# Patient Record
Sex: Female | Born: 1980
Health system: Southern US, Community
[De-identification: ages and names within clinical notes are randomized; demographics above are authoritative.]

## PROBLEM LIST (undated history)

## (undated) ENCOUNTER — Inpatient Hospital Stay (HOSPITAL_COMMUNITY): Payer: Self-pay

## (undated) DIAGNOSIS — R519 Headache, unspecified: Secondary | ICD-10-CM

## (undated) DIAGNOSIS — J45909 Unspecified asthma, uncomplicated: Secondary | ICD-10-CM

## (undated) DIAGNOSIS — F410 Panic disorder [episodic paroxysmal anxiety] without agoraphobia: Secondary | ICD-10-CM

## (undated) DIAGNOSIS — J189 Pneumonia, unspecified organism: Secondary | ICD-10-CM

## (undated) DIAGNOSIS — R51 Headache: Secondary | ICD-10-CM

## (undated) DIAGNOSIS — F32A Depression, unspecified: Secondary | ICD-10-CM

## (undated) DIAGNOSIS — F439 Reaction to severe stress, unspecified: Secondary | ICD-10-CM

## (undated) DIAGNOSIS — I10 Essential (primary) hypertension: Secondary | ICD-10-CM

## (undated) DIAGNOSIS — F329 Major depressive disorder, single episode, unspecified: Secondary | ICD-10-CM

## (undated) DIAGNOSIS — E119 Type 2 diabetes mellitus without complications: Secondary | ICD-10-CM

## (undated) HISTORY — PX: WISDOM TOOTH EXTRACTION: SHX21

## (undated) HISTORY — PX: INDUCED ABORTION: SHX677

---
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
## 2000-02-07 ENCOUNTER — Inpatient Hospital Stay (HOSPITAL_COMMUNITY): Admission: AD | Admit: 2000-02-07 | Discharge: 2000-02-07 | Payer: Self-pay | Admitting: Obstetrics

## 2000-05-04 ENCOUNTER — Ambulatory Visit (HOSPITAL_COMMUNITY): Admission: RE | Admit: 2000-05-04 | Discharge: 2000-05-04 | Payer: Self-pay | Admitting: *Deleted

## 2000-07-13 ENCOUNTER — Observation Stay (HOSPITAL_COMMUNITY): Admission: AD | Admit: 2000-07-13 | Discharge: 2000-07-14 | Payer: Self-pay | Admitting: Obstetrics

## 2000-07-13 ENCOUNTER — Encounter: Payer: Self-pay | Admitting: Obstetrics & Gynecology

## 2000-07-14 ENCOUNTER — Inpatient Hospital Stay (HOSPITAL_COMMUNITY): Admission: AD | Admit: 2000-07-14 | Discharge: 2000-07-14 | Payer: Self-pay | Admitting: Obstetrics & Gynecology

## 2000-07-15 ENCOUNTER — Inpatient Hospital Stay (HOSPITAL_COMMUNITY): Admission: AD | Admit: 2000-07-15 | Discharge: 2000-07-15 | Payer: Self-pay | Admitting: Obstetrics

## 2000-09-02 ENCOUNTER — Inpatient Hospital Stay (HOSPITAL_COMMUNITY): Admission: AD | Admit: 2000-09-02 | Discharge: 2000-09-02 | Payer: Self-pay | Admitting: *Deleted

## 2000-09-14 ENCOUNTER — Inpatient Hospital Stay (HOSPITAL_COMMUNITY): Admission: AD | Admit: 2000-09-14 | Discharge: 2000-09-14 | Payer: Self-pay | Admitting: Obstetrics & Gynecology

## 2000-09-20 ENCOUNTER — Encounter: Payer: Self-pay | Admitting: Obstetrics

## 2000-09-20 ENCOUNTER — Inpatient Hospital Stay (HOSPITAL_COMMUNITY): Admission: AD | Admit: 2000-09-20 | Discharge: 2000-09-20 | Payer: Self-pay | Admitting: Obstetrics

## 2000-09-24 ENCOUNTER — Inpatient Hospital Stay (HOSPITAL_COMMUNITY): Admission: AD | Admit: 2000-09-24 | Discharge: 2000-09-24 | Payer: Self-pay | Admitting: *Deleted

## 2000-09-25 ENCOUNTER — Inpatient Hospital Stay (HOSPITAL_COMMUNITY): Admission: AD | Admit: 2000-09-25 | Discharge: 2000-09-27 | Payer: Self-pay | Admitting: Obstetrics & Gynecology

## 2001-01-25 ENCOUNTER — Emergency Department (HOSPITAL_COMMUNITY): Admission: EM | Admit: 2001-01-25 | Discharge: 2001-01-25 | Payer: Self-pay | Admitting: Emergency Medicine

## 2001-05-15 ENCOUNTER — Emergency Department (HOSPITAL_COMMUNITY): Admission: EM | Admit: 2001-05-15 | Discharge: 2001-05-15 | Payer: Self-pay | Admitting: Emergency Medicine

## 2001-05-16 ENCOUNTER — Emergency Department (HOSPITAL_COMMUNITY): Admission: EM | Admit: 2001-05-16 | Discharge: 2001-05-16 | Payer: Self-pay | Admitting: Emergency Medicine

## 2001-05-31 ENCOUNTER — Emergency Department (HOSPITAL_COMMUNITY): Admission: EM | Admit: 2001-05-31 | Discharge: 2001-05-31 | Payer: Self-pay | Admitting: Emergency Medicine

## 2001-06-05 ENCOUNTER — Emergency Department (HOSPITAL_COMMUNITY): Admission: EM | Admit: 2001-06-05 | Discharge: 2001-06-05 | Payer: Self-pay | Admitting: Emergency Medicine

## 2001-08-07 ENCOUNTER — Inpatient Hospital Stay (HOSPITAL_COMMUNITY): Admission: AD | Admit: 2001-08-07 | Discharge: 2001-08-07 | Payer: Self-pay | Admitting: *Deleted

## 2002-04-24 ENCOUNTER — Emergency Department (HOSPITAL_COMMUNITY): Admission: EM | Admit: 2002-04-24 | Discharge: 2002-04-24 | Payer: Self-pay | Admitting: Emergency Medicine

## 2002-11-03 ENCOUNTER — Inpatient Hospital Stay (HOSPITAL_COMMUNITY): Admission: AD | Admit: 2002-11-03 | Discharge: 2002-11-03 | Payer: Self-pay | Admitting: *Deleted

## 2002-12-01 ENCOUNTER — Inpatient Hospital Stay (HOSPITAL_COMMUNITY): Admission: AD | Admit: 2002-12-01 | Discharge: 2002-12-01 | Payer: Self-pay | Admitting: *Deleted

## 2002-12-04 ENCOUNTER — Inpatient Hospital Stay (HOSPITAL_COMMUNITY): Admission: AD | Admit: 2002-12-04 | Discharge: 2002-12-04 | Payer: Self-pay | Admitting: *Deleted

## 2003-05-06 ENCOUNTER — Inpatient Hospital Stay (HOSPITAL_COMMUNITY): Admission: AD | Admit: 2003-05-06 | Discharge: 2003-05-06 | Payer: Self-pay | Admitting: *Deleted

## 2003-05-06 ENCOUNTER — Encounter: Payer: Self-pay | Admitting: *Deleted

## 2003-05-08 ENCOUNTER — Inpatient Hospital Stay: Admission: AD | Admit: 2003-05-08 | Discharge: 2003-05-08 | Payer: Self-pay | Admitting: Obstetrics & Gynecology

## 2003-09-02 ENCOUNTER — Inpatient Hospital Stay (HOSPITAL_COMMUNITY): Admission: AD | Admit: 2003-09-02 | Discharge: 2003-09-02 | Payer: Self-pay | Admitting: Obstetrics and Gynecology

## 2004-01-27 ENCOUNTER — Inpatient Hospital Stay (HOSPITAL_COMMUNITY): Admission: AD | Admit: 2004-01-27 | Discharge: 2004-01-27 | Payer: Self-pay | Admitting: *Deleted

## 2004-04-10 ENCOUNTER — Emergency Department (HOSPITAL_COMMUNITY): Admission: EM | Admit: 2004-04-10 | Discharge: 2004-04-10 | Payer: Self-pay | Admitting: Family Medicine

## 2004-06-15 ENCOUNTER — Emergency Department (HOSPITAL_COMMUNITY): Admission: EM | Admit: 2004-06-15 | Discharge: 2004-06-15 | Payer: Self-pay | Admitting: Emergency Medicine

## 2004-06-20 ENCOUNTER — Emergency Department (HOSPITAL_COMMUNITY): Admission: EM | Admit: 2004-06-20 | Discharge: 2004-06-20 | Payer: Self-pay | Admitting: Emergency Medicine

## 2004-09-23 ENCOUNTER — Emergency Department (HOSPITAL_COMMUNITY): Admission: EM | Admit: 2004-09-23 | Discharge: 2004-09-23 | Payer: Self-pay | Admitting: Emergency Medicine

## 2004-10-09 ENCOUNTER — Inpatient Hospital Stay (HOSPITAL_COMMUNITY): Admission: AD | Admit: 2004-10-09 | Discharge: 2004-10-09 | Payer: Self-pay | Admitting: *Deleted

## 2004-11-10 ENCOUNTER — Emergency Department (HOSPITAL_COMMUNITY): Admission: EM | Admit: 2004-11-10 | Discharge: 2004-11-10 | Payer: Self-pay | Admitting: Emergency Medicine

## 2005-02-23 ENCOUNTER — Emergency Department (HOSPITAL_COMMUNITY): Admission: EM | Admit: 2005-02-23 | Discharge: 2005-02-23 | Payer: Self-pay | Admitting: Emergency Medicine

## 2005-03-17 ENCOUNTER — Emergency Department (HOSPITAL_COMMUNITY): Admission: EM | Admit: 2005-03-17 | Discharge: 2005-03-17 | Payer: Self-pay | Admitting: Emergency Medicine

## 2005-04-15 ENCOUNTER — Inpatient Hospital Stay (HOSPITAL_COMMUNITY): Admission: AD | Admit: 2005-04-15 | Discharge: 2005-04-15 | Payer: Self-pay | Admitting: *Deleted

## 2005-05-15 ENCOUNTER — Emergency Department (HOSPITAL_COMMUNITY): Admission: EM | Admit: 2005-05-15 | Discharge: 2005-05-15 | Payer: Self-pay | Admitting: Emergency Medicine

## 2005-05-19 ENCOUNTER — Emergency Department (HOSPITAL_COMMUNITY): Admission: EM | Admit: 2005-05-19 | Discharge: 2005-05-19 | Payer: Self-pay | Admitting: Emergency Medicine

## 2005-05-21 ENCOUNTER — Emergency Department (HOSPITAL_COMMUNITY): Admission: EM | Admit: 2005-05-21 | Discharge: 2005-05-21 | Payer: Self-pay | Admitting: Emergency Medicine

## 2005-05-30 ENCOUNTER — Inpatient Hospital Stay (HOSPITAL_COMMUNITY): Admission: AD | Admit: 2005-05-30 | Discharge: 2005-05-30 | Payer: Self-pay | Admitting: Obstetrics and Gynecology

## 2005-10-15 ENCOUNTER — Inpatient Hospital Stay (HOSPITAL_COMMUNITY): Admission: AD | Admit: 2005-10-15 | Discharge: 2005-10-15 | Payer: Self-pay | Admitting: Obstetrics & Gynecology

## 2005-10-17 ENCOUNTER — Inpatient Hospital Stay (HOSPITAL_COMMUNITY): Admission: AD | Admit: 2005-10-17 | Discharge: 2005-10-17 | Payer: Self-pay | Admitting: Obstetrics and Gynecology

## 2005-10-19 ENCOUNTER — Inpatient Hospital Stay (HOSPITAL_COMMUNITY): Admission: AD | Admit: 2005-10-19 | Discharge: 2005-10-19 | Payer: Self-pay | Admitting: Obstetrics and Gynecology

## 2005-10-24 ENCOUNTER — Inpatient Hospital Stay (HOSPITAL_COMMUNITY): Admission: AD | Admit: 2005-10-24 | Discharge: 2005-10-24 | Payer: Self-pay | Admitting: Family Medicine

## 2005-10-29 ENCOUNTER — Inpatient Hospital Stay (HOSPITAL_COMMUNITY): Admission: AD | Admit: 2005-10-29 | Discharge: 2005-10-29 | Payer: Self-pay | Admitting: Obstetrics & Gynecology

## 2005-11-02 ENCOUNTER — Inpatient Hospital Stay (HOSPITAL_COMMUNITY): Admission: AD | Admit: 2005-11-02 | Discharge: 2005-11-02 | Payer: Self-pay | Admitting: Obstetrics and Gynecology

## 2005-11-07 ENCOUNTER — Inpatient Hospital Stay (HOSPITAL_COMMUNITY): Admission: AD | Admit: 2005-11-07 | Discharge: 2005-11-07 | Payer: Self-pay | Admitting: Obstetrics and Gynecology

## 2005-11-12 ENCOUNTER — Inpatient Hospital Stay (HOSPITAL_COMMUNITY): Admission: AD | Admit: 2005-11-12 | Discharge: 2005-11-12 | Payer: Self-pay | Admitting: *Deleted

## 2005-12-01 ENCOUNTER — Other Ambulatory Visit: Admission: RE | Admit: 2005-12-01 | Discharge: 2005-12-01 | Payer: Self-pay | Admitting: Obstetrics and Gynecology

## 2005-12-12 ENCOUNTER — Inpatient Hospital Stay (HOSPITAL_COMMUNITY): Admission: AD | Admit: 2005-12-12 | Discharge: 2005-12-13 | Payer: Self-pay | Admitting: Obstetrics and Gynecology

## 2006-01-05 ENCOUNTER — Inpatient Hospital Stay (HOSPITAL_COMMUNITY): Admission: AD | Admit: 2006-01-05 | Discharge: 2006-01-05 | Payer: Self-pay | Admitting: Obstetrics and Gynecology

## 2006-02-02 ENCOUNTER — Ambulatory Visit (HOSPITAL_COMMUNITY): Admission: RE | Admit: 2006-02-02 | Discharge: 2006-02-02 | Payer: Self-pay | Admitting: Obstetrics and Gynecology

## 2006-02-08 ENCOUNTER — Inpatient Hospital Stay (HOSPITAL_COMMUNITY): Admission: AD | Admit: 2006-02-08 | Discharge: 2006-02-08 | Payer: Self-pay | Admitting: Obstetrics and Gynecology

## 2006-03-18 ENCOUNTER — Inpatient Hospital Stay (HOSPITAL_COMMUNITY): Admission: AD | Admit: 2006-03-18 | Discharge: 2006-03-18 | Payer: Self-pay | Admitting: Obstetrics and Gynecology

## 2006-03-27 ENCOUNTER — Inpatient Hospital Stay (HOSPITAL_COMMUNITY): Admission: AD | Admit: 2006-03-27 | Discharge: 2006-03-27 | Payer: Self-pay | Admitting: Obstetrics and Gynecology

## 2006-04-17 ENCOUNTER — Inpatient Hospital Stay (HOSPITAL_COMMUNITY): Admission: AD | Admit: 2006-04-17 | Discharge: 2006-04-17 | Payer: Self-pay | Admitting: Obstetrics and Gynecology

## 2006-04-27 ENCOUNTER — Inpatient Hospital Stay (HOSPITAL_COMMUNITY): Admission: AD | Admit: 2006-04-27 | Discharge: 2006-04-27 | Payer: Self-pay | Admitting: Obstetrics and Gynecology

## 2006-05-03 ENCOUNTER — Ambulatory Visit (HOSPITAL_COMMUNITY): Admission: RE | Admit: 2006-05-03 | Discharge: 2006-05-03 | Payer: Self-pay | Admitting: Obstetrics and Gynecology

## 2006-05-08 ENCOUNTER — Inpatient Hospital Stay (HOSPITAL_COMMUNITY): Admission: AD | Admit: 2006-05-08 | Discharge: 2006-05-08 | Payer: Self-pay | Admitting: Obstetrics and Gynecology

## 2006-05-21 ENCOUNTER — Inpatient Hospital Stay (HOSPITAL_COMMUNITY): Admission: AD | Admit: 2006-05-21 | Discharge: 2006-05-21 | Payer: Self-pay | Admitting: Obstetrics and Gynecology

## 2006-05-29 ENCOUNTER — Inpatient Hospital Stay (HOSPITAL_COMMUNITY): Admission: AD | Admit: 2006-05-29 | Discharge: 2006-05-29 | Payer: Self-pay | Admitting: Obstetrics and Gynecology

## 2006-06-02 ENCOUNTER — Inpatient Hospital Stay (HOSPITAL_COMMUNITY): Admission: AD | Admit: 2006-06-02 | Discharge: 2006-06-02 | Payer: Self-pay | Admitting: Obstetrics and Gynecology

## 2006-06-05 ENCOUNTER — Inpatient Hospital Stay (HOSPITAL_COMMUNITY): Admission: AD | Admit: 2006-06-05 | Discharge: 2006-06-05 | Payer: Self-pay | Admitting: Obstetrics and Gynecology

## 2006-06-07 ENCOUNTER — Ambulatory Visit (HOSPITAL_COMMUNITY): Admission: RE | Admit: 2006-06-07 | Discharge: 2006-06-07 | Payer: Self-pay | Admitting: Obstetrics and Gynecology

## 2006-06-07 ENCOUNTER — Inpatient Hospital Stay (HOSPITAL_COMMUNITY): Admission: AD | Admit: 2006-06-07 | Discharge: 2006-06-07 | Payer: Self-pay | Admitting: Obstetrics and Gynecology

## 2006-06-12 ENCOUNTER — Ambulatory Visit (HOSPITAL_COMMUNITY): Admission: RE | Admit: 2006-06-12 | Discharge: 2006-06-12 | Payer: Self-pay | Admitting: Obstetrics and Gynecology

## 2006-06-15 ENCOUNTER — Inpatient Hospital Stay (HOSPITAL_COMMUNITY): Admission: RE | Admit: 2006-06-15 | Discharge: 2006-06-17 | Payer: Self-pay | Admitting: Obstetrics and Gynecology

## 2006-08-06 ENCOUNTER — Emergency Department (HOSPITAL_COMMUNITY): Admission: EM | Admit: 2006-08-06 | Discharge: 2006-08-07 | Payer: Self-pay | Admitting: Emergency Medicine

## 2006-10-19 ENCOUNTER — Emergency Department (HOSPITAL_COMMUNITY): Admission: EM | Admit: 2006-10-19 | Discharge: 2006-10-19 | Payer: Self-pay | Admitting: Emergency Medicine

## 2007-04-23 ENCOUNTER — Emergency Department (HOSPITAL_COMMUNITY): Admission: EM | Admit: 2007-04-23 | Discharge: 2007-04-24 | Payer: Self-pay | Admitting: Emergency Medicine

## 2007-04-28 ENCOUNTER — Emergency Department (HOSPITAL_COMMUNITY): Admission: EM | Admit: 2007-04-28 | Discharge: 2007-04-28 | Payer: Self-pay | Admitting: Emergency Medicine

## 2008-04-21 ENCOUNTER — Emergency Department (HOSPITAL_COMMUNITY): Admission: EM | Admit: 2008-04-21 | Discharge: 2008-04-21 | Payer: Self-pay | Admitting: Emergency Medicine

## 2008-06-28 ENCOUNTER — Emergency Department (HOSPITAL_COMMUNITY): Admission: EM | Admit: 2008-06-28 | Discharge: 2008-06-29 | Payer: Self-pay | Admitting: Emergency Medicine

## 2008-12-29 ENCOUNTER — Ambulatory Visit: Payer: Self-pay | Admitting: Advanced Practice Midwife

## 2008-12-29 ENCOUNTER — Inpatient Hospital Stay (HOSPITAL_COMMUNITY): Admission: AD | Admit: 2008-12-29 | Discharge: 2008-12-29 | Payer: Self-pay | Admitting: Obstetrics & Gynecology

## 2009-02-01 ENCOUNTER — Emergency Department (HOSPITAL_COMMUNITY): Admission: EM | Admit: 2009-02-01 | Discharge: 2009-02-01 | Payer: Self-pay | Admitting: *Deleted

## 2009-02-05 ENCOUNTER — Emergency Department (HOSPITAL_COMMUNITY): Admission: EM | Admit: 2009-02-05 | Discharge: 2009-02-05 | Payer: Self-pay | Admitting: Emergency Medicine

## 2009-02-24 ENCOUNTER — Emergency Department (HOSPITAL_COMMUNITY): Admission: EM | Admit: 2009-02-24 | Discharge: 2009-02-24 | Payer: Self-pay | Admitting: Emergency Medicine

## 2009-04-29 ENCOUNTER — Emergency Department (HOSPITAL_COMMUNITY): Admission: EM | Admit: 2009-04-29 | Discharge: 2009-04-29 | Payer: Self-pay | Admitting: Emergency Medicine

## 2009-05-04 ENCOUNTER — Inpatient Hospital Stay (HOSPITAL_COMMUNITY): Admission: AD | Admit: 2009-05-04 | Discharge: 2009-05-04 | Payer: Self-pay | Admitting: Obstetrics & Gynecology

## 2009-08-13 ENCOUNTER — Emergency Department (HOSPITAL_COMMUNITY): Admission: EM | Admit: 2009-08-13 | Discharge: 2009-08-13 | Payer: Self-pay | Admitting: Emergency Medicine

## 2009-10-24 ENCOUNTER — Emergency Department (HOSPITAL_COMMUNITY): Admission: EM | Admit: 2009-10-24 | Discharge: 2009-10-24 | Payer: Self-pay | Admitting: Emergency Medicine

## 2009-12-10 ENCOUNTER — Emergency Department (HOSPITAL_COMMUNITY): Admission: EM | Admit: 2009-12-10 | Discharge: 2009-12-10 | Payer: Self-pay | Admitting: Emergency Medicine

## 2010-10-16 ENCOUNTER — Encounter: Payer: Self-pay | Admitting: Obstetrics and Gynecology

## 2010-10-17 ENCOUNTER — Encounter: Payer: Self-pay | Admitting: *Deleted

## 2010-10-17 ENCOUNTER — Encounter: Payer: Self-pay | Admitting: Obstetrics and Gynecology

## 2010-10-17 ENCOUNTER — Encounter: Payer: Self-pay | Admitting: Emergency Medicine

## 2010-11-08 ENCOUNTER — Ambulatory Visit: Payer: Self-pay | Admitting: Endocrinology

## 2010-12-12 LAB — POCT PREGNANCY, URINE: Preg Test, Ur: NEGATIVE

## 2011-01-01 LAB — COMPREHENSIVE METABOLIC PANEL
ALT: 14 U/L (ref 0–35)
AST: 16 U/L (ref 0–37)
Albumin: 3.4 g/dL — ABNORMAL LOW (ref 3.5–5.2)
Alkaline Phosphatase: 72 U/L (ref 39–117)
CO2: 27 mEq/L (ref 19–32)
Chloride: 109 mEq/L (ref 96–112)
GFR calc Af Amer: >60 mL/min (ref >60)
Potassium: 4.2 mEq/L (ref 3.5–5.1)
Total Bilirubin: 0.1 mg/dL — ABNORMAL LOW (ref 0.3–1.2)

## 2011-01-01 LAB — URINALYSIS, ROUTINE W REFLEX MICROSCOPIC
Glucose, UA: NEGATIVE mg/dL
Protein, ur: NEGATIVE mg/dL
Specific Gravity, Urine: 1.018 (ref 1.005–1.030)

## 2011-01-01 LAB — CBC
Platelets: 427 10*3/uL — ABNORMAL HIGH (ref 150–400)
RBC: 4.45 MIL/uL (ref 3.87–5.11)
WBC: 11.8 10*3/uL — ABNORMAL HIGH (ref 4.0–10.5)

## 2011-01-01 LAB — URINE MICROSCOPIC-ADD ON

## 2011-01-01 LAB — DIFFERENTIAL
Basophils Absolute: 0.1 10*3/uL (ref 0.0–0.1)
Basophils Relative: 1 % (ref 0–1)
Eosinophils Absolute: 0.3 10*3/uL (ref 0.0–0.7)
Eosinophils Relative: 3 % (ref 0–5)
Monocytes Absolute: 0.7 10*3/uL (ref 0.1–1.0)

## 2011-01-01 LAB — POCT CARDIAC MARKERS: Myoglobin, poc: 55.3 ng/mL (ref 12–200)

## 2011-01-03 LAB — POCT PREGNANCY, URINE: Preg Test, Ur: NEGATIVE

## 2011-01-03 LAB — URINALYSIS, ROUTINE W REFLEX MICROSCOPIC
Glucose, UA: NEGATIVE mg/dL
Hgb urine dipstick: NEGATIVE
Ketones, ur: NEGATIVE mg/dL
Protein, ur: 30 mg/dL — AB

## 2011-01-03 LAB — URINE MICROSCOPIC-ADD ON

## 2011-01-04 LAB — RAPID STREP SCREEN (MED CTR MEBANE ONLY): Streptococcus, Group A Screen (Direct): POSITIVE — AB

## 2011-01-05 LAB — URINE MICROSCOPIC-ADD ON

## 2011-01-05 LAB — URINALYSIS, ROUTINE W REFLEX MICROSCOPIC
Glucose, UA: NEGATIVE mg/dL
Nitrite: NEGATIVE
Specific Gravity, Urine: 1.01 (ref 1.005–1.030)
Urobilinogen, UA: 0.2 mg/dL (ref 0.0–1.0)
pH: 7 (ref 5.0–8.0)

## 2011-01-05 LAB — ABO/RH: ABO/RH(D): A POS

## 2011-01-06 LAB — POCT PREGNANCY, URINE: Preg Test, Ur: NEGATIVE

## 2011-02-11 NOTE — Discharge Summary (Signed)
[facility]  Patient:    Katherine Douglas, Katherine Douglas                    MRN: [id number] Adm. Date:  [id number] Disc. Date: 07/14/00 Attending:  Antionette Char Dictator:   Andrey Spearman, M.D.                           Discharge Summary  DISCHARGE DIAGNOSES:          1. Intrauterine pregnancy at 30-5/7ths weeks.                               2. Third-trimester bleeding.  DISCHARGE MEDICATIONS:        None.  HISTORY OF PRESENT ILLNESS:  This patient is an 30 year old G3, P0-1-1-1 admitted at 30-4/7ths weeks by LMP and ultrasound at 20 weeks.  Patients past obstetrical history significant for an SAB in 1999 and an NSVD at 30 weeks in 2000 per patients report who came in complaining of constant irregular contractions on July 09, 2000 and leaking fluid from the vagina and spotting since July 09, 2000.  The patient stated that her bleeding had become heavier and intense ______ and was equal to her menses with bright red blood but no clotting.  Patient also reported decreased fetal movement for two days.  Mom stated that the baby was moving while she was in the hospital. Patients other medical history significant for marijuana use in the first trimester and abnormal Pap smear however patient had a repeat Pap smear two weeks ago which was within normal limits.  History of GC and Chlamydia in the past.  History of anemia and a D&C in 1999.  PHYSICAL EXAMINATION:         Patients cervical exam on admission found fingertip, long, and high with positive cervical motion tenderness.  Her ultrasound done yesterday showed an anterior placenta grade 2 in maturity, AFI of 14.7 and is size 1615 g which was 44th percentile for 30-1/2 weeks. Patient was admitted for observation secondary to third trimester bleeding.  HOSPITAL COURSE:              Patient had an uneventful hospital stay. Patient had a group B strep, GC and Chlamydia which were pending upon discharge.   Patients wet prep showed many yeast, moderate white blood cells, and too numerous to count bacteria.  Patient was therefore treated with azithromycin, Suprax, and Diflucan one time doses prior to discharge.  Patient received a urine drug screen which was also pending upon discharge.  Patient had no further bleeding with the exception of a small amount of blood from the rectum when she wiped which none of the hospital staff saw before the toilet paper was discarded.  Patients CBC was stable with hemoglobin 11.6, blood type of A positive, and the fetal heart rate remained reactive, and there was no contractions on tocometry.  Since patient was stable and the babys heart tones were reassuring and patient had no further episodes of bleeding she was deemed ready for discharge home.  She will continue routine prenatal care at [facility]. DD:  07/14/00 TD:  07/15/00 Job: [id number]

## 2011-06-24 LAB — POCT PREGNANCY, URINE: Preg Test, Ur: NEGATIVE

## 2011-06-27 LAB — URINALYSIS, ROUTINE W REFLEX MICROSCOPIC
Hgb urine dipstick: NEGATIVE
Nitrite: NEGATIVE
Protein, ur: NEGATIVE
Specific Gravity, Urine: 1.033 — ABNORMAL HIGH
Urobilinogen, UA: 0.2

## 2011-06-27 LAB — URINE MICROSCOPIC-ADD ON

## 2011-07-06 ENCOUNTER — Emergency Department (HOSPITAL_COMMUNITY)
Admission: EM | Admit: 2011-07-06 | Discharge: 2011-07-06 | Disposition: A | Discharge status: Home / Self Care | Payer: Self-pay | Attending: Emergency Medicine | Admitting: Emergency Medicine

## 2011-07-06 DIAGNOSIS — IMO0002 Reserved for concepts with insufficient information to code with codable children: Secondary | ICD-10-CM | POA: Insufficient documentation

## 2011-07-06 DIAGNOSIS — R000 Tachycardia, unspecified: Secondary | ICD-10-CM | POA: Insufficient documentation

## 2011-07-06 LAB — POCT PREGNANCY, URINE: Preg Test, Ur: NEGATIVE

## 2012-09-26 ENCOUNTER — Encounter (HOSPITAL_COMMUNITY): Payer: Self-pay | Admitting: *Deleted

## 2012-09-26 ENCOUNTER — Emergency Department (HOSPITAL_COMMUNITY): Payer: Self-pay

## 2012-09-26 ENCOUNTER — Emergency Department (HOSPITAL_COMMUNITY)
Admission: EM | Admit: 2012-09-26 | Discharge: 2012-09-26 | Disposition: A | Discharge status: Home / Self Care | Payer: Self-pay | Attending: Emergency Medicine | Admitting: Emergency Medicine

## 2012-09-26 DIAGNOSIS — M545 Low back pain, unspecified: Secondary | ICD-10-CM | POA: Insufficient documentation

## 2012-09-26 DIAGNOSIS — M79609 Pain in unspecified limb: Secondary | ICD-10-CM | POA: Insufficient documentation

## 2012-09-26 DIAGNOSIS — E119 Type 2 diabetes mellitus without complications: Secondary | ICD-10-CM | POA: Insufficient documentation

## 2012-09-26 DIAGNOSIS — Z8659 Personal history of other mental and behavioral disorders: Secondary | ICD-10-CM | POA: Insufficient documentation

## 2012-09-26 DIAGNOSIS — F17200 Nicotine dependence, unspecified, uncomplicated: Secondary | ICD-10-CM | POA: Insufficient documentation

## 2012-09-26 HISTORY — DX: Type 2 diabetes mellitus without complications: E11.9

## 2012-09-26 HISTORY — DX: Reaction to severe stress, unspecified: F43.9

## 2012-09-26 LAB — URINALYSIS, ROUTINE W REFLEX MICROSCOPIC
Glucose, UA: NEGATIVE mg/dL
Nitrite: NEGATIVE
Protein, ur: 30 mg/dL — AB
pH: 6 (ref 5.0–8.0)

## 2012-09-26 LAB — BASIC METABOLIC PANEL
CO2: 23 mEq/L (ref 19–32)
Calcium: 8.9 mg/dL (ref 8.4–10.5)
Chloride: 102 mEq/L (ref 96–112)
Sodium: 135 mEq/L (ref 135–145)

## 2012-09-26 LAB — CBC WITH DIFFERENTIAL/PLATELET
Basophils Absolute: 0 10*3/uL (ref 0.0–0.1)
Lymphocytes Relative: 19 % (ref 12–46)
Neutro Abs: 8.3 10*3/uL — ABNORMAL HIGH (ref 1.7–7.7)
Neutrophils Relative %: 72 % (ref 43–77)
Platelets: 397 10*3/uL (ref 150–400)
RDW: 15.5 % (ref 11.5–15.5)
WBC: 11.5 10*3/uL — ABNORMAL HIGH (ref 4.0–10.5)

## 2012-09-26 LAB — POCT PREGNANCY, URINE: Preg Test, Ur: NEGATIVE

## 2012-09-26 LAB — URINE MICROSCOPIC-ADD ON

## 2012-09-26 MED ORDER — HYDROMORPHONE HCL PF 1 MG/ML IJ SOLN
1.0000 mg | Freq: Once | INTRAMUSCULAR | Status: AC
  Administered 2012-09-26: 1 mg via INTRAVENOUS
  Filled 2012-09-26: qty 1

## 2012-09-26 MED ORDER — IBUPROFEN 800 MG PO TABS
800.0000 mg | ORAL_TABLET | Freq: Once | ORAL | Status: AC
  Administered 2012-09-26: 800 mg via ORAL
  Filled 2012-09-26: qty 1

## 2012-09-26 MED ORDER — CYCLOBENZAPRINE HCL 10 MG PO TABS: 10.0000 mg | ORAL_TABLET | Freq: Two times a day (BID) | ORAL | Status: DC | PRN

## 2012-09-26 MED ORDER — IBUPROFEN 800 MG PO TABS: 800.0000 mg | ORAL_TABLET | Freq: Three times a day (TID) | ORAL | Status: DC

## 2012-09-26 MED ORDER — ONDANSETRON HCL 4 MG/2ML IJ SOLN
4.0000 mg | Freq: Once | INTRAMUSCULAR | Status: AC
  Administered 2012-09-26: 4 mg via INTRAVENOUS
  Filled 2012-09-26: qty 2

## 2012-09-26 MED ORDER — CYCLOBENZAPRINE HCL 10 MG PO TABS
10.0000 mg | ORAL_TABLET | Freq: Once | ORAL | Status: AC
  Administered 2012-09-26: 10 mg via ORAL
  Filled 2012-09-26: qty 1

## 2012-09-26 MED ORDER — SODIUM CHLORIDE 0.9 % IV SOLN
INTRAVENOUS | Status: DC
  Administered 2012-09-26: 03:00:00 via INTRAVENOUS

## 2012-09-26 MED ORDER — CIPROFLOXACIN HCL 500 MG PO TABS
500.0000 mg | ORAL_TABLET | Freq: Once | ORAL | Status: AC
  Administered 2012-09-26: 500 mg via ORAL
  Filled 2012-09-26: qty 1

## 2012-09-26 MED ORDER — HYDROCODONE-ACETAMINOPHEN 7.5-500 MG/15ML PO SOLN: 15.0000 mL | Freq: Four times a day (QID) | ORAL | Status: DC | PRN

## 2012-09-26 NOTE — ED Provider Notes (Signed)
History     CSN: [id number]  Arrival date & time 09/26/12  0119   First MD Initiated Contact with Patient 09/26/12 0220      Chief Complaint  Patient presents with  . Foot Pain  . Leg Pain  . Back Pain    (Consider location/radiation/quality/duration/timing/severity/associated sxs/prior treatment) HPI HX per PT, LBP onset yesterday without trauma, worse tonight with muscle spasm and pain into L foot and leg, sharp in quality mod to severe worse with walking and movement. Has h/o back pain but not this severe, has not taken anything for this at home, no F/C, no rash. Has FH of DM but is not treated for any medical problems.  No weakness or numbness, no incontinence. No perineal numbness Past Medical History  Diagnosis Date  . Diabetes mellitus without complication   . Stress     History reviewed. No pertinent past surgical history.  History reviewed. No pertinent family history.  History  Substance Use Topics  . Smoking status: Current Every Day Smoker    Types: Cigarettes  . Smokeless tobacco: Not on file  . Alcohol Use: Yes     Comment: occasionally    OB History    Grav Para Term Preterm Abortions TAB SAB Ect Mult Living                  Review of Systems  Constitutional: Negative for fever and chills.  HENT: Negative for neck pain and neck stiffness.   Eyes: Negative for pain.  Respiratory: Negative for shortness of breath.   Cardiovascular: Negative for chest pain.  Gastrointestinal: Negative for abdominal pain.  Genitourinary: Negative for dysuria.  Musculoskeletal: Positive for back pain.  Skin: Negative for rash.  Neurological: Negative for weakness, numbness and headaches.  All other systems reviewed and are negative.    Allergies  Peanut-containing drug products; Penicillins; Shellfish allergy; and Strawberry  Home Medications   Current Outpatient Rx  Name  Route  Sig  Dispense  Refill  . IBUPROFEN 200 MG PO TABS   Oral   Take 400 mg by  mouth every 6 (six) hours as needed. For pain           BP 124/66  Pulse 75  Temp 98.7 F (37.1 C) (Oral)  Resp 16  SpO2 96%  LMP 09/06/2012  Physical Exam  Constitutional: She is oriented to person, place, and time. She appears well-developed and well-nourished.  HENT:  Head: Normocephalic and atraumatic.  Eyes: Conjunctivae normal and EOM are normal. Pupils are equal, round, and reactive to light.  Neck: Full passive range of motion without pain. Neck supple. No thyromegaly present.       No meningismus. No cervical spine tenderness  Cardiovascular: Normal rate, regular rhythm, S1 normal, S2 normal and intact distal pulses.   Pulmonary/Chest: Effort normal and breath sounds normal.  Abdominal: Soft. Bowel sounds are normal. There is no tenderness. There is no CVA tenderness.  Musculoskeletal: Normal range of motion.       Tender mostly L paraspinal lower thoracic and upper lumbar, some midline tenderness no deformity.  No LE deficits, DTRs, sensorium to light touch and motor strengths equal and intact  Neurological: She is alert and oriented to person, place, and time. She has normal strength and normal reflexes. No cranial nerve deficit or sensory deficit. She displays a negative Romberg sign. GCS eye subscore is 4. GCS verbal subscore is 5. GCS motor subscore is 6.  Normal Gait  Skin: Skin is warm and dry. No rash noted. No cyanosis. Nails show no clubbing.  Psychiatric: She has a normal mood and affect. Her speech is normal and behavior is normal.    ED Course  Procedures (including critical care time)  Results for orders placed during the hospital encounter of 09/26/12  CBC WITH DIFFERENTIAL      Component Value Range   WBC 11.5 (*) 4.0 - 10.5 K/uL   RBC 4.94  3.87 - 5.11 MIL/uL   Hemoglobin 13.6  12.0 - 15.0 g/dL   HCT 16.1  09.6 - 04.5 %   MCV 83.4  78.0 - 100.0 fL   MCH 27.5  26.0 - 34.0 pg   MCHC 33.0  30.0 - 36.0 g/dL   RDW 40.9  81.1 - 91.4 %   Platelets  397  150 - 400 K/uL   Neutrophils Relative 72  43 - 77 %   Neutro Abs 8.3 (*) 1.7 - 7.7 K/uL   Lymphocytes Relative 19  12 - 46 %   Lymphs Abs 2.2  0.7 - 4.0 K/uL   Monocytes Relative 8  3 - 12 %   Monocytes Absolute 0.9  0.1 - 1.0 K/uL   Eosinophils Relative 1  0 - 5 %   Eosinophils Absolute 0.1  0.0 - 0.7 K/uL   Basophils Relative 0  0 - 1 %   Basophils Absolute 0.0  0.0 - 0.1 K/uL  BASIC METABOLIC PANEL      Component Value Range   Sodium 135  135 - 145 mEq/L   Potassium 4.4  3.5 - 5.1 mEq/L   Chloride 102  96 - 112 mEq/L   CO2 23  19 - 32 mEq/L   Glucose, Bld 90  70 - 99 mg/dL   BUN 17  6 - 23 mg/dL   Creatinine, Ser 7.82 (*) 0.50 - 1.10 mg/dL   Calcium 8.9  8.4 - 95.6 mg/dL   GFR calc non Af Amer 63 (*) >90 mL/min   GFR calc Af Amer 73 (*) >90 mL/min  URINALYSIS, ROUTINE W REFLEX MICROSCOPIC      Component Value Range   Color, Urine YELLOW  YELLOW   APPearance CLOUDY (*) CLEAR   Specific Gravity, Urine 1.030  1.005 - 1.030   pH 6.0  5.0 - 8.0   Glucose, UA NEGATIVE  NEGATIVE mg/dL   Hgb urine dipstick NEGATIVE  NEGATIVE   Bilirubin Urine NEGATIVE  NEGATIVE   Ketones, ur 40 (*) NEGATIVE mg/dL   Protein, ur 30 (*) NEGATIVE mg/dL   Urobilinogen, UA 1.0  0.0 - 1.0 mg/dL   Nitrite NEGATIVE  NEGATIVE   Leukocytes, UA SMALL (*) NEGATIVE  POCT PREGNANCY, URINE      Component Value Range   Preg Test, Ur NEGATIVE  NEGATIVE  URINE MICROSCOPIC-ADD ON      Component Value Range   Squamous Epithelial / LPF MANY (*) RARE   WBC, UA 11-20  <3 WBC/hpf   Bacteria, UA MANY (*) RARE   Urine-Other MUCOUS PRESENT     Dg Thoracic Spine 2 View  09/26/2012  *RADIOLOGY REPORT*  Clinical Data: Upper back pain, radiating to both legs.  THORACIC SPINE - 2 VIEW  Comparison: Chest radiograph performed 06/02/2006  Findings: There is no evidence of fracture or subluxation. Vertebral bodies demonstrate normal height and alignment. Intervertebral disc spaces are preserved.  The visualized portions of  both lungs are clear.  The mediastinum is unremarkable in  appearance.  IMPRESSION: No evidence of fracture or subluxation along the thoracic spine.   Original Report Authenticated By: Tonia Ghent, M.D.    Dg Lumbar Spine Complete  09/26/2012  *RADIOLOGY REPORT*  Clinical Data: Lower back pain, radiating to both legs.  LUMBAR SPINE - COMPLETE 4+ VIEW  Comparison: CT of the abdomen and pelvis performed 08/06/2006  Findings: There is no evidence of fracture or subluxation. Vertebral bodies demonstrate normal height and alignment. Intervertebral disc spaces are preserved.  The visualized neural foramina are grossly unremarkable in appearance.  The visualized bowel gas pattern is unremarkable in appearance; air and stool are noted within the colon.  The sacroiliac joints are within normal limits.  IMPRESSION: No evidence of fracture or subluxation along the lumbar spine.   Original Report Authenticated By: Tonia Ghent, M.D.     Dilaudid IV. Ice. Flexeril. Motrin. Serial neuro exams with condition improving. Recheck at 6 AM she ambulates with pain significantly improved, states her back pain is resolved. No neuro deficits.   Back pain precautions provided and verbalized as understood. Plan outpatient followup. Prescriptions provided. MDM   Back pain with workup as above. X-rays reviewed. Pain resolved with medications as above.  Labs and imaging reviewed. UA contaminated, culture pending. No dysuria, urgency or frequency.      Katherine Nielsen, MD 09/26/12 [phone]

## 2012-09-26 NOTE — ED Notes (Signed)
Pt c/o bilateral foot pain, leg pain and low back pain starting Monday night, worsening over time. Pt is tearful and restless in triage. Pt states pain is a tight, pressure sort of pain. Pt denies injury. Pt states pain is not new but is worsen than ever experienced before.

## 2012-09-26 NOTE — ED Notes (Signed)
Patient made aware of urine sample that is needed. Patient unable to provide sample at this time.
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
## 2012-09-28 LAB — URINE CULTURE

## 2012-09-29 NOTE — ED Notes (Signed)
+   Urine Chart sent to EDP office for review. 

## 2012-09-30 NOTE — ED Notes (Signed)
Chart returned from EDP office. Prescribed Macrobid 100 mg #14. Take one twice a day for 7 days. Prescribed by Carleene Cooper MD.

## 2012-10-06 NOTE — ED Notes (Signed)
Unable to contact patient via phone. Sent letter. °

## 2012-10-18 NOTE — ED Notes (Signed)
Letter returned

## 2012-11-04 ENCOUNTER — Telehealth (HOSPITAL_COMMUNITY): Payer: Self-pay | Admitting: Emergency Medicine

## 2012-11-04 NOTE — ED Notes (Signed)
No response to letter sent after 30 days. Chart sent to Medical Records. °

## 2013-05-01 ENCOUNTER — Emergency Department (HOSPITAL_COMMUNITY)
Admission: EM | Admit: 2013-05-01 | Discharge: 2013-05-01 | Disposition: A | Discharge status: Home / Self Care | Payer: Self-pay | Attending: Emergency Medicine | Admitting: Emergency Medicine

## 2013-05-01 ENCOUNTER — Encounter (HOSPITAL_COMMUNITY): Payer: Self-pay

## 2013-05-01 DIAGNOSIS — IMO0002 Reserved for concepts with insufficient information to code with codable children: Secondary | ICD-10-CM

## 2013-05-01 DIAGNOSIS — Z8659 Personal history of other mental and behavioral disorders: Secondary | ICD-10-CM | POA: Insufficient documentation

## 2013-05-01 DIAGNOSIS — E119 Type 2 diabetes mellitus without complications: Secondary | ICD-10-CM | POA: Insufficient documentation

## 2013-05-01 DIAGNOSIS — F17200 Nicotine dependence, unspecified, uncomplicated: Secondary | ICD-10-CM | POA: Insufficient documentation

## 2013-05-01 DIAGNOSIS — N898 Other specified noninflammatory disorders of vagina: Secondary | ICD-10-CM | POA: Insufficient documentation

## 2013-05-01 DIAGNOSIS — Z3202 Encounter for pregnancy test, result negative: Secondary | ICD-10-CM | POA: Insufficient documentation

## 2013-05-01 DIAGNOSIS — Z880 Allergy status to penicillin: Secondary | ICD-10-CM | POA: Insufficient documentation

## 2013-05-01 LAB — URINALYSIS, ROUTINE W REFLEX MICROSCOPIC
Bilirubin Urine: NEGATIVE
Ketones, ur: NEGATIVE mg/dL
Nitrite: NEGATIVE
Protein, ur: NEGATIVE mg/dL
Specific Gravity, Urine: 1.024 (ref 1.005–1.030)
Urobilinogen, UA: 0.2 mg/dL (ref 0.0–1.0)

## 2013-05-01 MED ORDER — CEFPODOXIME PROXETIL 200 MG PO TABS
400.0000 mg | ORAL_TABLET | Freq: Once | ORAL | Status: AC
  Administered 2013-05-01: 400 mg via ORAL
  Filled 2013-05-01: qty 2

## 2013-05-01 MED ORDER — PROMETHAZINE HCL 25 MG PO TABS: 25.0000 mg | ORAL_TABLET | Freq: Four times a day (QID) | ORAL | Status: DC | PRN

## 2013-05-01 MED ORDER — AZITHROMYCIN 1 G PO PACK
1.0000 g | PACK | Freq: Once | ORAL | Status: AC
  Administered 2013-05-01: 1 g via ORAL
  Filled 2013-05-01: qty 1

## 2013-05-01 MED ORDER — ONDANSETRON 4 MG PO TBDP
4.0000 mg | ORAL_TABLET | Freq: Once | ORAL | Status: AC
  Administered 2013-05-01: 4 mg via ORAL
  Filled 2013-05-01: qty 1

## 2013-05-01 MED ORDER — LEVONORGESTREL 0.75 MG PO TABS
1.5000 mg | ORAL_TABLET | Freq: Once | ORAL | Status: AC
  Administered 2013-05-01: 1.5 mg via ORAL
  Filled 2013-05-01 (×2): qty 2

## 2013-05-01 MED ORDER — METRONIDAZOLE 500 MG PO TABS
2000.0000 mg | ORAL_TABLET | Freq: Once | ORAL | Status: AC
  Administered 2013-05-01: 2000 mg via ORAL
  Filled 2013-05-01: qty 4
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
## 2013-09-27 ENCOUNTER — Emergency Department (HOSPITAL_COMMUNITY)
Admission: EM | Admit: 2013-09-27 | Discharge: 2013-09-27 | Disposition: A | Discharge status: Home / Self Care | Payer: Medicaid Other | Attending: Emergency Medicine | Admitting: Emergency Medicine

## 2013-09-27 ENCOUNTER — Encounter (HOSPITAL_COMMUNITY): Payer: Self-pay | Admitting: Emergency Medicine

## 2013-09-27 DIAGNOSIS — Z880 Allergy status to penicillin: Secondary | ICD-10-CM | POA: Insufficient documentation

## 2013-09-27 DIAGNOSIS — K089 Disorder of teeth and supporting structures, unspecified: Secondary | ICD-10-CM | POA: Insufficient documentation

## 2013-09-27 DIAGNOSIS — H9209 Otalgia, unspecified ear: Secondary | ICD-10-CM | POA: Insufficient documentation

## 2013-09-27 DIAGNOSIS — K029 Dental caries, unspecified: Secondary | ICD-10-CM | POA: Insufficient documentation

## 2013-09-27 DIAGNOSIS — E119 Type 2 diabetes mellitus without complications: Secondary | ICD-10-CM | POA: Insufficient documentation

## 2013-09-27 DIAGNOSIS — F17200 Nicotine dependence, unspecified, uncomplicated: Secondary | ICD-10-CM | POA: Insufficient documentation

## 2013-09-27 DIAGNOSIS — K0889 Other specified disorders of teeth and supporting structures: Secondary | ICD-10-CM

## 2013-09-27 MED ORDER — OXYCODONE-ACETAMINOPHEN 5-325 MG PO TABS: 1.0000 | ORAL_TABLET | Freq: Four times a day (QID) | ORAL | Status: DC | PRN

## 2013-09-27 MED ORDER — AZITHROMYCIN 250 MG PO TABS: 250.0000 mg | ORAL_TABLET | Freq: Every day | ORAL | Status: DC

## 2013-09-27 NOTE — Discharge Instructions (Signed)
Dental Pain A tooth ache may be caused by cavities (tooth decay). Cavities expose the nerve of the tooth to air and hot or cold temperatures. It may come from an infection or abscess (also called a boil or furuncle) around your tooth. It is also often caused by dental caries (tooth decay). This causes the pain you are having. DIAGNOSIS  Your caregiver can diagnose this problem by exam. TREATMENT   If caused by an infection, it may be treated with medications which kill germs (antibiotics) and pain medications as prescribed by your caregiver. Take medications as directed.  Only take over-the-counter or prescription medicines for pain, discomfort, or fever as directed by your caregiver.  Whether the tooth ache today is caused by infection or dental disease, you should see your dentist as soon as possible for further care. SEEK MEDICAL CARE IF: The exam and treatment you received today has been provided on an emergency basis only. This is not a substitute for complete medical or dental care. If your problem worsens or new problems (symptoms) appear, and you are unable to meet with your dentist, call or return to this location. SEEK IMMEDIATE MEDICAL CARE IF:   You have a fever.  You develop redness and swelling of your face, jaw, or neck.  You are unable to open your mouth.  You have severe pain uncontrolled by pain medicine. MAKE SURE YOU:   Understand these instructions.  Will watch your condition.  Will get help right away if you are not doing well or get worse. Document Released: 09/12/2005 Document Revised: 12/05/2011 Document Reviewed: 04/30/2008 [facility] Patient Information 2014 [place].  Dental Care and Dentist Visits Dental care supports good overall health. Regular dental visits can also help you avoid dental pain, bleeding, infection, and other more serious health problems in the future. It is important to keep the mouth healthy because diseases in the teeth, gums,  and other oral tissues can spread to other areas of the body. Some problems, such as diabetes, heart disease, and pre-term labor have been associated with poor oral health.  See your dentist every 6 months. If you experience emergency problems such as a toothache or broken tooth, go to the dentist right away. If you see your dentist regularly, you may catch problems early. It is easier to be treated for problems in the early stages.  WHAT TO EXPECT AT A DENTIST VISIT  Your dentist will look for many common oral health problems and recommend proper treatment. At your regular dental visit, you can expect:  Gentle cleaning of the teeth and gums. This includes scraping and polishing. This helps to remove the sticky substance around the teeth and gums (plaque). Plaque forms in the mouth shortly after eating. Over time, plaque hardens on the teeth as tartar. If tartar is not removed regularly, it can cause problems. Cleaning also helps remove stains.  Periodic X-rays. These pictures of the teeth and supporting bone will help your dentist assess the health of your teeth.  Periodic fluoride treatments. Fluoride is a natural mineral shown to help strengthen teeth. Fluoride treatmentinvolves applying a fluoride gel or varnish to the teeth. It is most commonly done in children.  Examination of the mouth, tongue, jaws, teeth, and gums to look for any oral health problems, such as:  Cavities (dental caries). This is decay on the tooth caused by plaque, sugar, and acid in the mouth. It is best to catch a cavity when it is small.  Inflammation of the gums  caused by plaque buildup (gingivitis).  Problems with the mouth or malformed or misaligned teeth.  Oral cancer or other diseases of the soft tissues or jaws. KEEP YOUR TEETH AND GUMS HEALTHY For healthy teeth and gums, follow these general guidelines as well as your dentist's specific advice:  Have your teeth professionally cleaned at the dentist every 6  months.  Brush twice daily with a fluoride toothpaste.  Floss your teeth daily.  Ask your dentist if you need fluoride supplements, treatments, or fluoride toothpaste.  Eat a healthy diet. Reduce foods and drinks with added sugar.  Avoid smoking. TREATMENT FOR ORAL HEALTH PROBLEMS If you have oral health problems, treatment varies depending on the conditions present in your teeth and gums.  Your caregiver will most likely recommend good oral hygiene at each visit.  For cavities, gingivitis, or other oral health disease, your caregiver will perform a procedure to treat the problem. This is typically done at a separate appointment. Sometimes your caregiver will refer you to another dental specialist for specific tooth problems or for surgery. SEEK IMMEDIATE DENTAL CARE IF:  You have pain, bleeding, or soreness in the gum, tooth, jaw, or mouth area.  A permanent tooth becomes loose or separated from the gum socket.  You experience a blow or injury to the mouth or jaw area. Document Released: 05/25/2011 Document Revised: 12/05/2011 Document Reviewed: 05/25/2011 [facility] Patient Information 2014 [place], [place].

## 2013-09-27 NOTE — ED Provider Notes (Signed)
Medical screening examination/treatment/procedure(s) were performed by non-physician practitioner and as supervising physician I was immediately available for consultation/collaboration.  EKG Interpretation   None         Houston Siren, MD 09/27/13 [phone]

## 2013-09-27 NOTE — ED Provider Notes (Signed)
CSN: [id number]     Arrival date & time 09/27/13  1015 History   First MD Initiated Contact with Patient 09/27/13 1019     Chief Complaint  Patient presents with  . Dental Pain    pain in r/side of mouth x 5 weeks  . Otalgia    pressure in r/ear x 5 weeks   (Consider location/radiation/quality/duration/timing/severity/associated sxs/prior Treatment) HPI  Patient presents to the emergency department with a dental complaint. Symptoms began 5 weeks ago and continue to get worse. The patient has tried to alleviate pain with Ibuprofen.  Pain rated at a 10/10, characterized as throbbing in nature and located right lower back molar. Patient denies fever, night sweats, chills, difficulty swallowing or opening mouth, SOB, nuchal rigidity or decreased ROM of neck.  Patient does not have a dentist and requests a resource guide at discharge.   Past Medical History  Diagnosis Date  . Diabetes mellitus without complication   . Stress    Past Surgical History  Procedure Laterality Date  . Induced abortion     Family History  Problem Relation Age of Onset  . Hypertension Mother   . Heart failure Mother   . Diabetes Father    History  Substance Use Topics  . Smoking status: Current Every Day Smoker    Types: Cigarettes  . Smokeless tobacco: Not on file  . Alcohol Use: Yes     Comment: occasionally   OB History   Grav Para Term Preterm Abortions TAB SAB Ect Mult Living                 Review of Systems  Review of Systems  Gen: no weight loss, fevers, chills, night sweats  Eyes: no discharge or drainage, no occular pain or visual changes  Nose: no epistaxis or rhinorrhea  Mouth: + dental pain, no sore throat  Neck: no neck pain  Lungs:No wheezing, coughing or hemoptysis CV: no chest pain, palpitations, dependent edema or orthopnea  Abd: no abdominal pain, nausea, vomiting  GU: no dysuria or gross hematuria  MSK:  No abnormalities  Neuro: no headache, no focal neurologic deficits    Skin: no abnormalities Psyche: negative.   Allergies  Peanut-containing drug products; Penicillins; and Strawberry  Home Medications   Current Outpatient Rx  Name  Route  Sig  Dispense  Refill  . ibuprofen (ADVIL,MOTRIN) 200 MG tablet   Oral   Take 400 mg by mouth every 8 (eight) hours as needed (TAKING 7 tablets of motrin 200 mg every 4-6 hrs).          Marland Kitchen azithromycin (ZITHROMAX) 250 MG tablet   Oral   Take 1 tablet (250 mg total) by mouth daily. Take first 2 tablets together, then 1 every day until finished.   6 tablet   0   . oxyCODONE-acetaminophen (PERCOCET/ROXICET) 5-325 MG per tablet   Oral   Take 1 tablet by mouth every 6 (six) hours as needed for severe pain.   15 tablet   0    BP 132/87  Pulse 82  Temp(Src) 98.4 F (36.9 C) (Oral)  Resp 18  Wt 215 lb (97.523 kg)  SpO2 98%  LMP 09/02/2013 Physical Exam  Constitutional: She appears well-developed and well-nourished. No distress.  HENT:  Head: Normocephalic and atraumatic.  Mouth/Throat: Uvula is midline, oropharynx is clear and moist and mucous membranes are normal. Normal dentition. Dental caries (Pts tooth shows no obvious abscess but moderate to severe tenderness to palpation of marked  tooth) present. No uvula swelling.    Eyes: Pupils are equal, round, and reactive to light.  Neck: Trachea normal, normal range of motion and full passive range of motion without pain. Neck supple.  Cardiovascular: Normal rate, regular rhythm, normal heart sounds and normal pulses.   Pulmonary/Chest: Effort normal and breath sounds normal. No respiratory distress. Chest wall is not dull to percussion. She exhibits no tenderness, no crepitus, no edema, no deformity and no retraction.  Abdominal: Normal appearance.  Musculoskeletal: Normal range of motion.  Neurological: She is alert. She has normal strength.  Skin: Skin is warm, dry and intact. She is not diaphoretic.  Psychiatric: She has a normal mood and affect. Her  speech is normal. Cognition and memory are normal.    ED Course  NERVE BLOCK Date/Time: 09/27/2013 10:51 AM Performed by: Linus Mako Authorized by: Linus Mako Consent: Verbal consent obtained. Risks and benefits: risks, benefits and alternatives were discussed Consent given by: patient Patient identity confirmed: verbally with patient Indications: pain relief Body area: face/mouth Laterality: right Patient sedated: no Needle gauge: 25 G Location technique: anatomical landmarks Local anesthetic: lidocaine 1% with epinephrine Anesthetic total: 5 ml Outcome: pain improved Patient tolerance: Patient tolerated the procedure well with no immediate complications. Comments: Painfully resolved with dental block to right lower molar   (including critical care time) Labs Review Labs Reviewed - No data to display Imaging Review No results found.  EKG Interpretation   None       MDM   1. Pain, dental     Patient has dental pain. No emergent s/sx's present. Patent airway. No trismus.  Will be given pain medication and antibiotics. I discussed the need to call dentist within 24/48 hours for follow-up. Dental referral given. Return to ED precautions given.  Pt voiced understanding and has agreed to follow-up.   33 y.o.Katherine Douglas's evaluation in the Emergency Department is complete. It has been determined that no acute conditions requiring further emergency intervention are present at this time. The patient/guardian have been advised of the diagnosis and plan. We have discussed signs and symptoms that warrant return to the ED, such as changes or worsening in symptoms.  Vital signs are stable at discharge. Filed Vitals:   09/27/13 1023  BP: 132/87  Pulse: 82  Temp: 98.4 F (36.9 C)  Resp: 18    Patient/guardian has voiced understanding and agreed to follow-up with the PCP or specialist.     Linus Mako, PA-C 09/27/13 1053

## 2013-09-27 NOTE — ED Notes (Signed)
Pt reports throbbing pressure on r/side of face x 5 weeks

## 2013-09-27 NOTE — Progress Notes (Signed)
[facility] provided pt with a list of primary care resources, dental resources, and ACA information. Patient stated that she was pending medicaid.

## 2014-01-22 ENCOUNTER — Emergency Department (HOSPITAL_COMMUNITY)
Admission: EM | Admit: 2014-01-22 | Discharge: 2014-01-22 | Disposition: A | Discharge status: Home / Self Care | Payer: Medicaid Other | Attending: Emergency Medicine | Admitting: Emergency Medicine

## 2014-01-22 ENCOUNTER — Encounter (HOSPITAL_COMMUNITY): Payer: Self-pay | Admitting: Emergency Medicine

## 2014-01-22 DIAGNOSIS — R42 Dizziness and giddiness: Secondary | ICD-10-CM | POA: Insufficient documentation

## 2014-01-22 DIAGNOSIS — M542 Cervicalgia: Secondary | ICD-10-CM | POA: Insufficient documentation

## 2014-01-22 DIAGNOSIS — R5383 Other fatigue: Secondary | ICD-10-CM

## 2014-01-22 DIAGNOSIS — Z880 Allergy status to penicillin: Secondary | ICD-10-CM | POA: Insufficient documentation

## 2014-01-22 DIAGNOSIS — R51 Headache: Secondary | ICD-10-CM | POA: Insufficient documentation

## 2014-01-22 DIAGNOSIS — R209 Unspecified disturbances of skin sensation: Secondary | ICD-10-CM | POA: Insufficient documentation

## 2014-01-22 DIAGNOSIS — R519 Headache, unspecified: Secondary | ICD-10-CM

## 2014-01-22 DIAGNOSIS — R5381 Other malaise: Secondary | ICD-10-CM | POA: Insufficient documentation

## 2014-01-22 DIAGNOSIS — F17200 Nicotine dependence, unspecified, uncomplicated: Secondary | ICD-10-CM | POA: Insufficient documentation

## 2014-01-22 DIAGNOSIS — Z8679 Personal history of other diseases of the circulatory system: Secondary | ICD-10-CM | POA: Insufficient documentation

## 2014-01-22 DIAGNOSIS — E119 Type 2 diabetes mellitus without complications: Secondary | ICD-10-CM | POA: Insufficient documentation

## 2014-01-22 LAB — CBG MONITORING, ED: GLUCOSE-CAPILLARY: 85 mg/dL (ref 70–99)

## 2014-01-22 MED ORDER — SODIUM CHLORIDE 0.9 % IV BOLUS (SEPSIS)
1000.0000 mL | Freq: Once | INTRAVENOUS | Status: AC
  Administered 2014-01-22: 1000 mL via INTRAVENOUS

## 2014-01-22 MED ORDER — DIAZEPAM 5 MG PO TABS: 5.0000 mg | ORAL_TABLET | Freq: Two times a day (BID) | ORAL | Status: DC | PRN

## 2014-01-22 MED ORDER — DIPHENHYDRAMINE HCL 50 MG/ML IJ SOLN
25.0000 mg | Freq: Once | INTRAMUSCULAR | Status: AC
Start: 2014-01-22 — End: 2014-01-22
  Administered 2014-01-22: 25 mg via INTRAVENOUS
  Filled 2014-01-22: qty 1

## 2014-01-22 MED ORDER — KETOROLAC TROMETHAMINE 30 MG/ML IJ SOLN
30.0000 mg | Freq: Once | INTRAMUSCULAR | Status: AC
  Administered 2014-01-22: 30 mg via INTRAVENOUS
  Filled 2014-01-22: qty 1

## 2014-01-22 MED ORDER — METOCLOPRAMIDE HCL 5 MG/ML IJ SOLN
10.0000 mg | Freq: Once | INTRAMUSCULAR | Status: AC
  Administered 2014-01-22: 10 mg via INTRAVENOUS
  Filled 2014-01-22: qty 2

## 2014-01-22 NOTE — ED Provider Notes (Signed)
CSN: [id number]     Arrival date & time 01/22/14  1336 History   First MD Initiated Contact with Patient 01/22/14 1522     Chief Complaint  Patient presents with  . Headache  . Neck Pain     (Consider location/radiation/quality/duration/timing/severity/associated sxs/prior Treatment) HPI Comments: Patient presents to the ED with a chief complaint of headache and neck pain.  She states that the pain started this morning, but she has had this problem before.  She complains of pain in her neck that is worsened with rotation and bending.  She states that it feels like her muscles are pulling.  She also complains of a headache.  She reports having a history of migraines.  She states that the headache has progressively worsened.  Additionally, she endorses some tingling in her extremities which she describes as "pins and needles."  She denies any numbness or weakness.  She denies any fevers or chills.  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  She states that the headache made her feel a little dizzy earlier.  She describes this as a feeling of lightheadedness.   The history is provided by the patient. No language interpreter was used.    Past Medical History  Diagnosis Date  . Diabetes mellitus without complication   . Stress    Past Surgical History  Procedure Laterality Date  . Induced abortion     Family History  Problem Relation Age of Onset  . Hypertension Mother   . Heart failure Mother   . Diabetes Father    History  Substance Use Topics  . Smoking status: Current Every Day Smoker    Types: Cigarettes  . Smokeless tobacco: Not on file  . Alcohol Use: Yes     Comment: occasionally   OB History   Grav Para Term Preterm Abortions TAB SAB Ect Mult Living                 Review of Systems  Constitutional: Negative for fever and chills.  Respiratory: Negative for shortness  of breath.   Cardiovascular: Negative for chest pain.  Gastrointestinal: Negative for nausea, vomiting, diarrhea and constipation.  Genitourinary: Negative for dysuria.  Musculoskeletal: Positive for myalgias.  Neurological: Positive for dizziness and headaches.      Allergies  Peanut-containing drug products; Penicillins; and Strawberry  Home Medications   Prior to Admission medications   Not on File   BP 133/84  Pulse 71  Temp(Src) 98.8 F (37.1 C) (Oral)  Resp 16  SpO2 98%  LMP 01/22/2014 Physical Exam  Nursing note and vitals reviewed. Constitutional: She is oriented to person, place, and time. She appears well-developed and well-nourished.  HENT:  Head: Normocephalic and atraumatic.  Right Ear: External ear normal.  Left Ear: External ear normal.  Eyes: Conjunctivae and EOM are normal. Pupils are equal, round, and reactive to light.  Neck: Normal range of motion. Neck supple.  No pain with neck flexion, no meningismus  Cardiovascular: Normal rate, regular rhythm and normal heart sounds.  Exam reveals no gallop and no friction rub.   No murmur heard. Pulmonary/Chest: Effort normal and breath sounds normal. No respiratory distress. She has no wheezes. She has no rales. She exhibits no tenderness.  Abdominal: Soft. She exhibits no distension and no mass. There is no tenderness. There is no rebound and no guarding.  Musculoskeletal: Normal range of motion.  She exhibits no edema and no tenderness.  Cervical paraspinal muscles and upper trapezius are tender to palpation.  Positive spurling  ROM and strength 5/5 in upper and lower extremities bilaterally  Normal gait.  Neurological: She is alert and oriented to person, place, and time. She has normal reflexes.  CN 3-12 intact, normal finger to nose, no pronator drift, sensation and strength intact bilaterally.  Skin: Skin is warm and dry.  Psychiatric: She has a normal mood and affect. Her behavior is normal. Judgment  and thought content normal.    ED Course  Procedures (including critical care time) Labs Review Labs Reviewed - No data to display  Imaging Review No results found.   EKG Interpretation None      MDM   Final diagnoses:  Headache    Patient with headache and radicular type symptoms.  No focal neurologic deficits.  I suspect that the headache is a tension headache and the associated radicular symptoms are peripheral neuropathies.  Doubt stroke, or SAH.  No weakness or syncopal episodes.  Patient does have a history of diabetes.  Will check CBG, and treat headache.  Will re-evaluate after treating headache.  4:55 PM Patient is feeling much better.  Currently asymptomatic.  Neurovascularly intact and re-examined x2.    Pt HA treated and improved while in ED.  Presentation is like pts typical HA and non concerning for [facility], ICH, Meningitis, or temporal arteritis. Pt is afebrile with no focal neuro deficits, nuchal rigidity, or change in vision. Pt is to follow up with PCP to discuss prophylactic medication. Pt verbalizes understanding and is agreeable with plan to dc.      Montine Circle, PA-C 01/22/14 1656

## 2014-01-22 NOTE — ED Notes (Signed)
Initial Contact - pt to [facility] with c/o 8/10 HA, neck and back pain.  Pt reports hx "muscle spasms in my legs which then turns into pain shooting up into my head and back".  Pt reports momentary dizziness with initial pain onset, denies at this time.  A+Ox4, ambulatory with steady gait.  Neuros grossly intact.  Pt denies visual changes.  Tongue midline, speech clear.  RR even/un-lab, speaking full sentences.  Pt denies n/v/d/c, weakness or other complaints.  NAD.

## 2014-01-22 NOTE — ED Notes (Signed)
Pt states that for three years she has had episodes of sudden onset of tingling, headache and neck stiffness.  Pt states that the tingling/pin needle sensation rotates around from back forward and down legs.  Today at work, she felt dizzy with the headache and states now her tingling/pin needle sensation is back.  No previous diagnosis.  Was told it was muscle spasms.  Pt states she felt like she was going to pass out.

## 2014-01-22 NOTE — Discharge Instructions (Signed)
Cervical Radiculopathy Cervical radiculopathy happens when a nerve in the neck is pinched or bruised by a slipped (herniated) disk or by arthritic changes in the bones of the cervical spine. This can occur due to an injury or as part of the normal aging process. Pressure on the cervical nerves can cause pain or numbness that runs from your neck all the way down into your arm and fingers. CAUSES  There are many possible causes, including:  Injury.  Muscle tightness in the neck from overuse.  Swollen, painful joints (arthritis).  Breakdown or degeneration in the bones and joints of the spine (spondylosis) due to aging.  Bone spurs that may develop near the cervical nerves. SYMPTOMS  Symptoms include pain, weakness, or numbness in the affected arm and hand. Pain can be severe or irritating. Symptoms may be worse when extending or turning the neck. DIAGNOSIS  Your caregiver will ask about your symptoms and do a physical exam. He or she may test your strength and reflexes. X-rays, CT scans, and MRI scans may be needed in cases of injury or if the symptoms do not go away after a period of time. Electromyography (EMG) or nerve conduction testing may be done to study how your nerves and muscles are working. TREATMENT  Your caregiver may recommend certain exercises to help relieve your symptoms. Cervical radiculopathy can, and often does, get better with time and treatment. If your problems continue, treatment options may include:  Wearing a soft collar for short periods of time.  Physical therapy to strengthen the neck muscles.  Medicines, such as nonsteroidal anti-inflammatory drugs (NSAIDs), oral corticosteroids, or spinal injections.  Surgery. Different types of surgery may be done depending on the cause of your problems. HOME CARE INSTRUCTIONS   Put ice on the affected area.  Put ice in a plastic bag.  Place a towel between your skin and the bag.  Leave the ice on for 15-20 minutes,  03-04 times a day or as directed by your caregiver.  If ice does not help, you can try using heat. Take a warm shower or bath, or use a hot water bottle as directed by your caregiver.  You may try a gentle neck and shoulder massage.  Use a flat pillow when you sleep.  Only take over-the-counter or prescription medicines for pain, discomfort, or fever as directed by your caregiver.  If physical therapy was prescribed, follow your caregiver's directions.  If a soft collar was prescribed, use it as directed. SEEK IMMEDIATE MEDICAL CARE IF:   Your pain gets much worse and cannot be controlled with medicines.  You have weakness or numbness in your hand, arm, face, or leg.  You have a high fever or a stiff, rigid neck.  You lose bowel or bladder control (incontinence).  You have trouble with walking, balance, or speaking. MAKE SURE YOU:   Understand these instructions.  Will watch your condition.  Will get help right away if you are not doing well or get worse. Document Released: 06/07/2001 Document Revised: 12/05/2011 Document Reviewed: 04/26/2011 [facility] Patient Information 2014 [place], [place]. Muscle Cramps and Spasms Muscle cramps and spasms occur when a muscle or muscles tighten and you have no control over this tightening (involuntary muscle contraction). They are a common problem and can develop in any muscle. The most common place is in the calf muscles of the leg. Both muscle cramps and muscle spasms are involuntary muscle contractions, but they also have differences:   Muscle cramps are sporadic and  painful. They may last a few seconds to a quarter of an hour. Muscle cramps are often more forceful and last longer than muscle spasms.  Muscle spasms may or may not be painful. They may also last just a few seconds or much longer. CAUSES  It is uncommon for cramps or spasms to be due to a serious underlying problem. In many cases, the cause of cramps or spasms is unknown.  Some common causes are:   Overexertion.   Overuse from repetitive motions (doing the same thing over and over).   Remaining in a certain position for a long period of time.   Improper preparation, form, or technique while performing a sport or activity.   Dehydration.   Injury.   Side effects of some medicines.   Abnormally low levels of the salts and ions in your blood (electrolytes), especially potassium and calcium. This could happen if you are taking water pills (diuretics) or you are pregnant.  Some underlying medical problems can make it more likely to develop cramps or spasms. These include, but are not limited to:   Diabetes.   Parkinson disease.   Hormone disorders, such as thyroid problems.   Alcohol abuse.   Diseases specific to muscles, joints, and bones.   Blood vessel disease where not enough blood is getting to the muscles.  HOME CARE INSTRUCTIONS   Stay well hydrated. Drink enough water and fluids to keep your urine clear or pale yellow.  It may be helpful to massage, stretch, and relax the affected muscle.  For tight or tense muscles, use a warm towel, heating pad, or hot shower water directed to the affected area.  If you are sore or have pain after a cramp or spasm, applying ice to the affected area may relieve discomfort.  Put ice in a plastic bag.  Place a towel between your skin and the bag.  Leave the ice on for 15-20 minutes, 03-04 times a day.  Medicines used to treat a known cause of cramps or spasms may help reduce their frequency or severity. Only take over-the-counter or prescription medicines as directed by your caregiver. SEEK MEDICAL CARE IF:  Your cramps or spasms get more severe, more frequent, or do not improve over time.  MAKE SURE YOU:   Understand these instructions.  Will watch your condition.  Will get help right away if you are not doing well or get worse. Document Released: 03/04/2002 Document Revised:  01/07/2013 Document Reviewed: 08/29/2012 [employer] Patient Information 2014 [place], [place].

## 2014-01-24 NOTE — ED Provider Notes (Signed)
Medical screening examination/treatment/procedure(s) were performed by non-physician practitioner and as supervising physician I was immediately available for consultation/collaboration.   Kathalene Frames, MD 01/24/14 [phone]

## 2014-03-31 ENCOUNTER — Emergency Department (HOSPITAL_COMMUNITY)
Admission: EM | Admit: 2014-03-31 | Discharge: 2014-04-01 | Disposition: A | Discharge status: Home / Self Care | Payer: Medicaid Other | Attending: Emergency Medicine | Admitting: Emergency Medicine

## 2014-03-31 ENCOUNTER — Encounter (HOSPITAL_COMMUNITY): Payer: Self-pay | Admitting: Emergency Medicine

## 2014-03-31 DIAGNOSIS — T6592XA Toxic effect of unspecified substance, intentional self-harm, initial encounter: Secondary | ICD-10-CM

## 2014-03-31 DIAGNOSIS — T50992A Poisoning by other drugs, medicaments and biological substances, intentional self-harm, initial encounter: Secondary | ICD-10-CM | POA: Diagnosis not present

## 2014-03-31 DIAGNOSIS — Z91199 Patient's noncompliance with other medical treatment and regimen due to unspecified reason: Secondary | ICD-10-CM | POA: Diagnosis not present

## 2014-03-31 DIAGNOSIS — E119 Type 2 diabetes mellitus without complications: Secondary | ICD-10-CM | POA: Diagnosis not present

## 2014-03-31 DIAGNOSIS — T450X4A Poisoning by antiallergic and antiemetic drugs, undetermined, initial encounter: Secondary | ICD-10-CM | POA: Insufficient documentation

## 2014-03-31 DIAGNOSIS — Z9119 Patient's noncompliance with other medical treatment and regimen: Secondary | ICD-10-CM | POA: Insufficient documentation

## 2014-03-31 DIAGNOSIS — Z880 Allergy status to penicillin: Secondary | ICD-10-CM | POA: Diagnosis not present

## 2014-03-31 DIAGNOSIS — F17200 Nicotine dependence, unspecified, uncomplicated: Secondary | ICD-10-CM | POA: Diagnosis not present

## 2014-03-31 DIAGNOSIS — F4329 Adjustment disorder with other symptoms: Secondary | ICD-10-CM

## 2014-03-31 DIAGNOSIS — F410 Panic disorder [episodic paroxysmal anxiety] without agoraphobia: Secondary | ICD-10-CM | POA: Diagnosis not present

## 2014-03-31 HISTORY — DX: Panic disorder (episodic paroxysmal anxiety): F41.0

## 2014-03-31 LAB — COMPREHENSIVE METABOLIC PANEL
ALBUMIN: 3 g/dL — AB (ref 3.5–5.2)
ALK PHOS: 60 U/L (ref 39–117)
ALT: 10 U/L (ref 0–35)
AST: 13 U/L (ref 0–37)
Anion gap: 10 (ref 5–15)
BUN: 14 mg/dL (ref 6–23)
CALCIUM: 8.8 mg/dL (ref 8.4–10.5)
CO2: 25 mEq/L (ref 19–32)
Chloride: 105 mEq/L (ref 96–112)
Creatinine, Ser: 1.07 mg/dL (ref 0.50–1.10)
GFR calc non Af Amer: 68 mL/min — ABNORMAL LOW (ref >90)
GFR, EST AFRICAN AMERICAN: 79 mL/min — AB (ref >90)
Glucose, Bld: 89 mg/dL (ref 70–99)
Potassium: 3.9 mEq/L (ref 3.7–5.3)
SODIUM: 140 meq/L (ref 137–147)
TOTAL PROTEIN: 6.1 g/dL (ref 6.0–8.3)
Total Bilirubin: <0.2 mg/dL — ABNORMAL LOW (ref 0.3–1.2)

## 2014-03-31 LAB — CBC
HCT: 33.4 % — ABNORMAL LOW (ref 36.0–46.0)
Hemoglobin: 10.3 g/dL — ABNORMAL LOW (ref 12.0–15.0)
MCH: 23.6 pg — ABNORMAL LOW (ref 26.0–34.0)
MCHC: 30.8 g/dL (ref 30.0–36.0)
MCV: 76.6 fL — ABNORMAL LOW (ref 78.0–100.0)
Platelets: 336 10*3/uL (ref 150–400)
RBC: 4.36 MIL/uL (ref 3.87–5.11)
RDW: 16.4 % — AB (ref 11.5–15.5)
WBC: 10.2 10*3/uL (ref 4.0–10.5)

## 2014-03-31 LAB — PREGNANCY, URINE: PREG TEST UR: NEGATIVE

## 2014-03-31 LAB — SALICYLATE LEVEL

## 2014-03-31 LAB — RAPID URINE DRUG SCREEN, HOSP PERFORMED
AMPHETAMINES: NOT DETECTED
Barbiturates: NOT DETECTED
Benzodiazepines: NOT DETECTED
Cocaine: NOT DETECTED
OPIATES: NOT DETECTED
Tetrahydrocannabinol: POSITIVE — AB

## 2014-03-31 LAB — ETHANOL: Alcohol, Ethyl (B): <11 mg/dL (ref 0–11)

## 2014-03-31 LAB — ACETAMINOPHEN LEVEL

## 2014-03-31 MED ORDER — ALUM & MAG HYDROXIDE-SIMETH 200-200-20 MG/5ML PO SUSP: 30.0000 mL | ORAL | Status: DC | PRN

## 2014-03-31 MED ORDER — NICOTINE 21 MG/24HR TD PT24
21.0000 mg | MEDICATED_PATCH | Freq: Every day | TRANSDERMAL | Status: DC
  Administered 2014-04-01: 21 mg via TRANSDERMAL
  Filled 2014-03-31: qty 1

## 2014-03-31 MED ORDER — LORAZEPAM 1 MG PO TABS
1.0000 mg | ORAL_TABLET | Freq: Three times a day (TID) | ORAL | Status: DC | PRN
  Administered 2014-04-01: 1 mg via ORAL
  Filled 2014-03-31: qty 1

## 2014-03-31 MED ORDER — ZOLPIDEM TARTRATE 5 MG PO TABS: 5.0000 mg | ORAL_TABLET | Freq: Every evening | ORAL | Status: DC | PRN

## 2014-03-31 MED ORDER — IBUPROFEN 200 MG PO TABS: 600.0000 mg | ORAL_TABLET | Freq: Three times a day (TID) | ORAL | Status: DC | PRN

## 2014-03-31 MED ORDER — ACETAMINOPHEN 325 MG PO TABS: 650.0000 mg | ORAL_TABLET | ORAL | Status: DC | PRN

## 2014-03-31 MED ORDER — ONDANSETRON HCL 4 MG PO TABS: 4.0000 mg | ORAL_TABLET | Freq: Three times a day (TID) | ORAL | Status: DC | PRN

## 2014-03-31 NOTE — ED Notes (Signed)
Pt reports having multiple stressors in her life.  Pt did leave a suicide note prior to ingesting the pills.  Pt is calm and tearful stating that she, "just wants to sleep."
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
## 2014-03-31 NOTE — ED Notes (Signed)
Spoke w/ CJ at [facility] control informed him that pt took 9 xyzal that went out of date 06/21/13.  Per CJ pt may become sleepy, c/o headache or dry mouth.  No life threatening issues r/t the ingestion of this amount per CJ as they are out of date and pt would have had to take 150 mg of this in order to see sleepiness.

## 2014-03-31 NOTE — ED Notes (Signed)
Bed: SE39 Expected date: 03/31/14 Expected time: 7:51 PM Means of arrival: Ambulance Comments: SI/overdose allergy medication

## 2014-03-31 NOTE — Progress Notes (Signed)
  CARE MANAGEMENT ED NOTE 03/31/2014  Patient:  Katherine Douglas, Katherine Douglas   Account Number:  [id number]  Date Initiated:  03/31/2014  Documentation initiated by:  Livia Snellen  Subjective/Objective Assessment:   Patient presents to Ed with SI     Subjective/Objective Assessment Detail:   Per EMS pt took 9 levocetirizine in a suicide attempt today.  Patient with pmhx of diabetes, stress and panic attacks     Action/Plan:   TTS consult   Action/Plan Detail:   Anticipated DC Date:       Status Recommendation to Physician:   Result of Recommendation:    Other ED [place]  Other  PCP issues    Choice offered to / List presented to:            Status of service:  Completed, signed off  ED Comments:   ED Comments Detail:  EDCM spoke to patient at bedside.  Patient reports she doesn't know if she has Medicaid.  Patient confirms she does  not have a pcp.  [facility] provided patient with a list of pcps who accept Medicaid insurance in [place]. EDCM also provided patient with a list of pcps who accept self pay patients, list of discounted pharmacies and websites, needymeds.org and [employer].br for medication assistance, phone number to inquire about the orange card, phone number to inquire about Medicaid.  [facility] encouraged patient to call the DSS and ask if she is active with Medicaid and if so to send her a card.  EDCM also provided financial resources in the community such as local churches and salvation [employer], urban ministries and dental assistance for uninsured patients.  [facility] provided patient with pamphlet to [facility] and informed patient that walkins are welcome between 9am-1030am Mon-Thurs.  Patient thankful for services.  No further EDCM needs at this time.

## 2014-04-01 NOTE — ED Notes (Signed)
Spoke w/ CJ from poison control informed him of pt's VS and lab results.  Per CJ pt is cleared by poison control.

## 2014-04-01 NOTE — ED Notes (Signed)
Patient discharged to home with referrals to go back into counseling.  She is denying suicidal ideation at this time.

## 2014-04-01 NOTE — ED Provider Notes (Signed)
Medical screening examination/treatment/procedure(s) were performed by non-physician practitioner and as supervising physician I was immediately available for consultation/collaboration.   EKG Interpretation   Date/Time:  Monday March 31 2014 20:48:02 EDT Ventricular Rate:  75 PR Interval:  205 QRS Duration: 75 QT Interval:  381 QTC Calculation: 425 R Axis:   64 Text Interpretation:  Sinus rhythm Borderline prolonged PR interval No  significant change since last tracing Confirmed by [facility]  MD, Jenny Reichmann  [phone]) on 03/31/2014 9:39:56 PM       Babette Relic, MD 04/01/14 1355

## 2014-04-01 NOTE — Consult Note (Signed)
[facility] Face-to-Face Psychiatry Consult   Reason for Consult:  Took an overdose and was reportedly suicidal Referring Physician:  ER MD  Katherine Douglas is an 33 y.o. female. Total Time spent with patient: 45 minutes  Assessment: AXIS I:  Adjustment Disorder with Mixed Emotional Features AXIS II:  Deferred AXIS III:   Past Medical History  Diagnosis Date  . Diabetes mellitus without complication   . Stress   . Panic attacks    AXIS IV:  economic problems and occupational problems AXIS V:  51-60 moderate symptoms  Plan:  No evidence of imminent risk to self or others at present.    Subjective:   Katherine Douglas is a 33 y.o. female patient admitted with reported overdose with suicidal ideation.  HPI:  Katherine Douglas says she took the pills to go to sleep.  Said she read the label first, she has a history of taking more pills than recommended if they don't work at first.  She said she was not suicidal and does not want to die.  The note she wrote was part of her journaling, she says.  Her fiance who was with her during this interview said she has notes all over the house as part of what she learned in therapy.  She has been stressed with losing her job, losing her apartment, a miscarriage and living temporarily in her mother's apartment.  She had been in therapy till about 6 months ago and that helped.  No previous inpatient though she did make a suicide attempt when she was 13.  Says she wants to go home, has no suicidal intent or thoughts, loves her children, loves her fiance whom she plans to marry in a few weeks, believes in God and is afraid of dying.  Her fiance said she was definitely stressed out but agreed he did not see her as suicidal and thinks she is better off at home. HPI Elements:   Location:  depression and worry. Quality:  feels stressed out. Severity:  not suicidal she says. Timing:  many stresses. Duration:  several weeks. Context:  as above.  Past Psychiatric  History: Past Medical History  Diagnosis Date  . Diabetes mellitus without complication   . Stress   . Panic attacks     reports that she has been smoking Cigarettes.  She has been smoking about 0.00 packs per day. She does not have any smokeless tobacco history on file. She reports that she drinks alcohol. She reports that she does not use illicit drugs. Family History  Problem Relation Age of Onset  . Hypertension Mother   . Heart failure Mother   . Diabetes Father            Allergies:   Allergies  Allergen Reactions  . Other Anaphylaxis    Crawfish   . Peanut-Containing Drug Products Itching  . Penicillins Itching  . Strawberry Hives, Itching and Rash    ACT Assessment Complete:  Yes:    Educational Status    Risk to Self: Risk to self Is patient at risk for suicide?: Yes Substance abuse history and/or treatment for substance abuse?: No  Risk to Others:    Abuse:    Prior Inpatient Therapy:    Prior Outpatient Therapy:    Additional Information:                    Objective: Blood pressure 133/85, pulse 88, temperature 97.9 F (36.6 C), temperature source Oral, resp. rate  18, last menstrual period 03/22/2014, SpO2 100.00%.There is no height or weight on file to calculate BMI. Results for orders placed during the hospital encounter of 03/31/14 (from the past 72 hour(s))  CBC     Status: Abnormal   Collection Time    03/31/14  8:34 PM      Result Value Ref Range   WBC 10.2  4.0 - 10.5 K/uL   RBC 4.36  3.87 - 5.11 MIL/uL   Hemoglobin 10.3 (*) 12.0 - 15.0 g/dL   HCT 33.4 (*) 36.0 - 46.0 %   MCV 76.6 (*) 78.0 - 100.0 fL   MCH 23.6 (*) 26.0 - 34.0 pg   MCHC 30.8  30.0 - 36.0 g/dL   RDW 16.4 (*) 11.5 - 15.5 %   Platelets 336  150 - 400 K/uL  COMPREHENSIVE METABOLIC PANEL     Status: Abnormal   Collection Time    03/31/14  8:34 PM      Result Value Ref Range   Sodium 140  137 - 147 mEq/L   Potassium 3.9  3.7 - 5.3 mEq/L   Chloride 105  96 - 112  mEq/L   CO2 25  19 - 32 mEq/L   Glucose, Bld 89  70 - 99 mg/dL   BUN 14  6 - 23 mg/dL   Creatinine, Ser 1.07  0.50 - 1.10 mg/dL   Calcium 8.8  8.4 - 10.5 mg/dL   Total Protein 6.1  6.0 - 8.3 g/dL   Albumin 3.0 (*) 3.5 - 5.2 g/dL   AST 13  0 - 37 U/L   ALT 10  0 - 35 U/L   Alkaline Phosphatase 60  39 - 117 U/L   Total Bilirubin <0.2 (*) 0.3 - 1.2 mg/dL   GFR calc non Af Amer 68 (*) >90 mL/min   GFR calc Af Amer 79 (*) >90 mL/min   Comment: (NOTE)     The eGFR has been calculated using the CKD EPI equation.     This calculation has not been validated in all clinical situations.     eGFR's persistently <90 mL/min signify possible Chronic Kidney     Disease.   Anion gap 10  5 - 15  ETHANOL     Status: None   Collection Time    03/31/14  8:34 PM      Result Value Ref Range   Alcohol, Ethyl (B) <11  0 - 11 mg/dL   Comment:            LOWEST DETECTABLE LIMIT FOR     SERUM ALCOHOL IS 11 mg/dL     FOR MEDICAL PURPOSES ONLY  ACETAMINOPHEN LEVEL     Status: None   Collection Time    03/31/14  8:34 PM      Result Value Ref Range   Acetaminophen (Tylenol), Serum <15.0  10 - 30 ug/mL   Comment:            THERAPEUTIC CONCENTRATIONS VARY     SIGNIFICANTLY. A RANGE OF 10-30     ug/mL MAY BE AN EFFECTIVE     CONCENTRATION FOR MANY PATIENTS.     HOWEVER, SOME ARE BEST TREATED     AT CONCENTRATIONS OUTSIDE THIS     RANGE.     ACETAMINOPHEN CONCENTRATIONS     >150 ug/mL AT 4 HOURS AFTER     INGESTION AND >50 ug/mL AT 12     HOURS AFTER INGESTION ARE     OFTEN  ASSOCIATED WITH TOXIC     REACTIONS.  SALICYLATE LEVEL     Status: Abnormal   Collection Time    03/31/14  8:34 PM      Result Value Ref Range   Salicylate Lvl <2.5 (*) 2.8 - 20.0 mg/dL  URINE RAPID DRUG SCREEN (HOSP PERFORMED)     Status: Abnormal   Collection Time    03/31/14  8:36 PM      Result Value Ref Range   Opiates NONE DETECTED  NONE DETECTED   Cocaine NONE DETECTED  NONE DETECTED   Benzodiazepines NONE DETECTED   NONE DETECTED   Amphetamines NONE DETECTED  NONE DETECTED   Tetrahydrocannabinol POSITIVE (*) NONE DETECTED   Barbiturates NONE DETECTED  NONE DETECTED   Comment:            DRUG SCREEN FOR MEDICAL PURPOSES     ONLY.  IF CONFIRMATION IS NEEDED     FOR ANY PURPOSE, NOTIFY LAB     WITHIN 5 DAYS.                LOWEST DETECTABLE LIMITS     FOR URINE DRUG SCREEN     Drug Class       Cutoff (ng/mL)     Amphetamine      1000     Barbiturate      200     Benzodiazepine   852     Tricyclics       778     Opiates          300     Cocaine          300     THC              50  PREGNANCY, URINE     Status: None   Collection Time    03/31/14  8:58 PM      Result Value Ref Range   Preg Test, Ur NEGATIVE  NEGATIVE   Comment:            THE SENSITIVITY OF THIS     METHODOLOGY IS >20 mIU/mL.   Labs are reviewed and are pertinent for THC.  Current Facility-Administered Medications  Medication Dose Route Frequency Provider Last Rate Last Dose  . acetaminophen (TYLENOL) tablet 650 mg  650 mg Oral Q4H PRN Nicole Pisciotta, PA-C      . alum & mag hydroxide-simeth (MAALOX/MYLANTA) 200-200-20 MG/5ML suspension 30 mL  30 mL Oral PRN Nicole Pisciotta, PA-C      . ibuprofen (ADVIL,MOTRIN) tablet 600 mg  600 mg Oral Q8H PRN Nicole Pisciotta, PA-C      . LORazepam (ATIVAN) tablet 1 mg  1 mg Oral Q8H PRN Nicole Pisciotta, PA-C   1 mg at 04/01/14 0323  . nicotine (NICODERM CQ - dosed in mg/24 hours) patch 21 mg  21 mg Transdermal Daily Nicole Pisciotta, PA-C   21 mg at 04/01/14 0323  . ondansetron (ZOFRAN) tablet 4 mg  4 mg Oral Q8H PRN Nicole Pisciotta, PA-C      . zolpidem (AMBIEN) tablet 5 mg  5 mg Oral QHS PRN Monico Blitz, PA-C       No current outpatient prescriptions on file.    Psychiatric Specialty Exam:     Blood pressure 133/85, pulse 88, temperature 97.9 F (36.6 C), temperature source Oral, resp. rate 18, last menstrual period 03/22/2014, SpO2 100.00%.There is no height or weight on  file to calculate BMI.  General Appearance:  Fairly Groomed  Engineer, water::  Good  Speech:  Clear and Coherent  Volume:  Normal  Mood:  Anxious  Affect:  Appropriate  Thought Process:  Coherent and Logical  Orientation:  Full (Time, Place, and Person)  Thought Content:  Negative  Suicidal Thoughts:  No  Homicidal Thoughts:  No  Memory:  Immediate;   Good Recent;   Good Remote;   Good  Judgement:  Intact  Insight:  Good  Psychomotor Activity:  Normal  Concentration:  Good  Recall:  Good  Fund of Knowledge:Good  Language: Good  Akathisia:  Negative  Handed:  Right  AIMS (if indicated):     Assets:  Communication Skills Desire for Improvement Financial Resources/Insurance Housing Intimacy Leisure Time [place] Talents/Skills Transportation Vocational/Educational  Sleep:      Musculoskeletal: Strength & Muscle Tone: within normal limits Gait & Station: normal Patient leans: N/A  Treatment Plan Summary: discharge home today to be followed outpatient  TAYLOR,GERALD D 04/01/2014 11:30 AM

## 2014-04-01 NOTE — Consult Note (Signed)
  Review of Systems  Constitutional: Negative.   HENT: Negative.   Eyes: Negative.   Respiratory: Negative.   Cardiovascular: Negative.   Gastrointestinal: Negative.   Genitourinary: Negative.   Musculoskeletal: Negative.   Skin: Negative.   Endo/Heme/Allergies: Negative.   Psychiatric/Behavioral: Positive for depression. The patient is nervous/anxious.

## 2014-04-01 NOTE — [facility] Suicide Risk Assessment (Signed)
Suicide Risk Assessment  Discharge Assessment     Demographic Factors:  Divorced or widowed  Total Time spent with patient: 45 minutes  Psychiatric Specialty Exam:     Blood pressure 133/85, pulse 88, temperature 97.9 F (36.6 C), temperature source Oral, resp. rate 18, last menstrual period 03/22/2014, SpO2 100.00%.There is no height or weight on file to calculate BMI.  General Appearance: Fairly Groomed  Engineer, water::  Good  Speech:  Clear and Coherent  Volume:  Normal  Mood:  Anxious  Affect:  Appropriate  Thought Process:  Negative  Orientation:  Full (Time, Place, and Person)  Thought Content:  Negative  Suicidal Thoughts:  No  Homicidal Thoughts:  No  Memory:  Immediate;   Good Recent;   Good Remote;   Good  Judgement:  Good  Insight:  Good  Psychomotor Activity:  Normal  Concentration:  Good  Recall:  Good  Fund of Knowledge:Good  Language: Good  Akathisia:  Negative  Handed:  Right  AIMS (if indicated):     Assets:  Communication Skills Desire for Improvement Financial Resources/Insurance Housing Intimacy Leisure Time Physical Health Resilience Social Support Talents/Skills Transportation Vocational/Educational  Sleep:       Musculoskeletal: Strength & Muscle Tone: within normal limits Gait & Station: normal Patient leans: N/A   Mental Status Per Nursing Assessment::   On Admission:     Current Mental Status by Physician: NA  Loss Factors: Decrease in vocational status  Historical Factors: Prior suicide attempts  Risk Reduction Factors:   Responsible for children under 59 years of age, Sense of responsibility to family, Religious beliefs about death, Living with another person, especially a relative, Positive social support, Positive therapeutic relationship and Positive coping skills or problem solving skills  Continued Clinical Symptoms:  depression  Cognitive Features That Contribute To Risk:  none  Suicide Risk:  Minimal: No  identifiable suicidal ideation.  Patients presenting with no risk factors but with morbid ruminations; may be classified as minimal risk based on the severity of the depressive symptoms  Discharge Diagnoses:   AXIS I:  Adjustment Disorder with Mixed Emotional Features AXIS II:  Deferred AXIS III:   Past Medical History  Diagnosis Date  . Diabetes mellitus without complication   . Stress   . Panic attacks    AXIS IV:  economic problems and occupational problems AXIS V:  61-70 mild symptoms  Plan Of Care/Follow-up recommendations:  Activity:  resume usual activity Diet:  resume usual diet  Is patient on multiple antipsychotic therapies at discharge:  No   Has Patient had three or more failed trials of antipsychotic monotherapy by history:  No  Recommended Plan for Multiple Antipsychotic Therapies: NA    TAYLOR,GERALD D 04/01/2014, 11:50 AM

## 2014-04-01 NOTE — Progress Notes (Signed)
Pt was given "New Beginnings" packet. Staff discussed packet with pt. Pt was alert and appreciative of the packet.

## 2014-08-14 ENCOUNTER — Inpatient Hospital Stay (HOSPITAL_COMMUNITY): Payer: Medicaid Other

## 2014-08-14 ENCOUNTER — Emergency Department (HOSPITAL_COMMUNITY): Payer: Medicaid Other

## 2014-08-14 ENCOUNTER — Inpatient Hospital Stay (HOSPITAL_COMMUNITY)
Admission: EM | Admit: 2014-08-14 | Discharge: 2014-08-16 | DRG: 871 | Disposition: A | Discharge status: Home / Self Care | Payer: Medicaid Other | Attending: Internal Medicine | Admitting: Internal Medicine

## 2014-08-14 ENCOUNTER — Encounter (HOSPITAL_COMMUNITY): Payer: Self-pay | Admitting: Emergency Medicine

## 2014-08-14 DIAGNOSIS — R079 Chest pain, unspecified: Secondary | ICD-10-CM | POA: Diagnosis present

## 2014-08-14 DIAGNOSIS — Z91018 Allergy to other foods: Secondary | ICD-10-CM

## 2014-08-14 DIAGNOSIS — Z91010 Allergy to peanuts: Secondary | ICD-10-CM | POA: Diagnosis not present

## 2014-08-14 DIAGNOSIS — R0602 Shortness of breath: Secondary | ICD-10-CM | POA: Diagnosis not present

## 2014-08-14 DIAGNOSIS — D509 Iron deficiency anemia, unspecified: Secondary | ICD-10-CM

## 2014-08-14 DIAGNOSIS — Z8249 Family history of ischemic heart disease and other diseases of the circulatory system: Secondary | ICD-10-CM | POA: Diagnosis not present

## 2014-08-14 DIAGNOSIS — A419 Sepsis, unspecified organism: Secondary | ICD-10-CM | POA: Diagnosis not present

## 2014-08-14 DIAGNOSIS — J310 Chronic rhinitis: Secondary | ICD-10-CM | POA: Diagnosis present

## 2014-08-14 DIAGNOSIS — Z833 Family history of diabetes mellitus: Secondary | ICD-10-CM | POA: Diagnosis not present

## 2014-08-14 DIAGNOSIS — Z91013 Allergy to seafood: Secondary | ICD-10-CM | POA: Diagnosis not present

## 2014-08-14 DIAGNOSIS — Z880 Allergy status to penicillin: Secondary | ICD-10-CM | POA: Diagnosis not present

## 2014-08-14 DIAGNOSIS — R0789 Other chest pain: Secondary | ICD-10-CM

## 2014-08-14 DIAGNOSIS — O99519 Diseases of the respiratory system complicating pregnancy, unspecified trimester: Secondary | ICD-10-CM | POA: Diagnosis present

## 2014-08-14 DIAGNOSIS — Z720 Tobacco use: Secondary | ICD-10-CM

## 2014-08-14 DIAGNOSIS — F17210 Nicotine dependence, cigarettes, uncomplicated: Secondary | ICD-10-CM | POA: Diagnosis present

## 2014-08-14 DIAGNOSIS — R000 Tachycardia, unspecified: Secondary | ICD-10-CM | POA: Insufficient documentation

## 2014-08-14 DIAGNOSIS — J189 Pneumonia, unspecified organism: Secondary | ICD-10-CM | POA: Diagnosis present

## 2014-08-14 DIAGNOSIS — F410 Panic disorder [episodic paroxysmal anxiety] without agoraphobia: Secondary | ICD-10-CM | POA: Diagnosis present

## 2014-08-14 LAB — CBC
HCT: 31.2 % — ABNORMAL LOW (ref 36.0–46.0)
HEMATOCRIT: 27.9 % — AB (ref 36.0–46.0)
Hemoglobin: 9.8 g/dL — ABNORMAL LOW (ref 12.0–15.0)
Hemoglobin: 8.8 g/dL — ABNORMAL LOW (ref 12.0–15.0)
MCH: 23.4 pg — ABNORMAL LOW (ref 26.0–34.0)
MCH: 23.9 pg — AB (ref 26.0–34.0)
MCHC: 31.4 g/dL (ref 30.0–36.0)
MCHC: 31.5 g/dL (ref 30.0–36.0)
MCV: 74.5 fL — ABNORMAL LOW (ref 78.0–100.0)
MCV: 75.8 fL — AB (ref 78.0–100.0)
PLATELETS: 562 10*3/uL — AB (ref 150–400)
Platelets: 433 10*3/uL — ABNORMAL HIGH (ref 150–400)
RBC: 4.19 MIL/uL (ref 3.87–5.11)
RBC: 3.68 MIL/uL — ABNORMAL LOW (ref 3.87–5.11)
RDW: 16.3 % — ABNORMAL HIGH (ref 11.5–15.5)
RDW: 16.2 % — AB (ref 11.5–15.5)
WBC: 39 10*3/uL — ABNORMAL HIGH (ref 4.0–10.5)
WBC: 45.4 10*3/uL — ABNORMAL HIGH (ref 4.0–10.5)

## 2014-08-14 LAB — URINALYSIS, ROUTINE W REFLEX MICROSCOPIC
Glucose, UA: NEGATIVE mg/dL
HGB URINE DIPSTICK: NEGATIVE
Ketones, ur: NEGATIVE mg/dL
Nitrite: NEGATIVE
PROTEIN: 30 mg/dL — AB
Specific Gravity, Urine: 1.025 (ref 1.005–1.030)
Urobilinogen, UA: 1 mg/dL (ref 0.0–1.0)
pH: 6 (ref 5.0–8.0)

## 2014-08-14 LAB — URINE MICROSCOPIC-ADD ON

## 2014-08-14 LAB — RETICULOCYTES
RBC.: 3.68 MIL/uL — ABNORMAL LOW (ref 3.87–5.11)
Retic Count, Absolute: 40.5 10*3/uL (ref 19.0–186.0)
Retic Ct Pct: 1.1 % (ref 0.4–3.1)

## 2014-08-14 LAB — BASIC METABOLIC PANEL
ANION GAP: 15 (ref 5–15)
BUN: 9 mg/dL (ref 6–23)
CO2: 22 mEq/L (ref 19–32)
Calcium: 9.4 mg/dL (ref 8.4–10.5)
Chloride: 98 mEq/L (ref 96–112)
Creatinine, Ser: 0.93 mg/dL (ref 0.50–1.10)
GFR calc non Af Amer: 80 mL/min — ABNORMAL LOW (ref >90)
Glucose, Bld: 131 mg/dL — ABNORMAL HIGH (ref 70–99)
POTASSIUM: 4 meq/L (ref 3.7–5.3)
Sodium: 135 mEq/L — ABNORMAL LOW (ref 137–147)

## 2014-08-14 LAB — I-STAT TROPONIN, ED: TROPONIN I, POC: 0.04 ng/mL (ref 0.00–0.08)

## 2014-08-14 LAB — TROPONIN I: Troponin I: <0.3 ng/mL (ref <0.30)

## 2014-08-14 LAB — D-DIMER, QUANTITATIVE (NOT AT ARMC): D-Dimer, Quant: 0.46 ug/mL-FEU (ref 0.00–0.48)

## 2014-08-14 LAB — I-STAT CG4 LACTIC ACID, ED
Lactic Acid, Venous: 2.89 mmol/L — ABNORMAL HIGH (ref 0.5–2.2)
Lactic Acid, Venous: 1.66 mmol/L (ref 0.5–2.2)

## 2014-08-14 LAB — POCT PREGNANCY, URINE: PREG TEST UR: POSITIVE — AB

## 2014-08-14 LAB — CREATININE, SERUM
Creatinine, Ser: 0.94 mg/dL (ref 0.50–1.10)
GFR calc Af Amer: >90 mL/min (ref >90)
GFR, EST NON AFRICAN AMERICAN: 79 mL/min — AB (ref >90)

## 2014-08-14 LAB — EXPECTORATED SPUTUM ASSESSMENT W GRAM STAIN, RFLX TO RESP C

## 2014-08-14 LAB — PRO B NATRIURETIC PEPTIDE: Pro B Natriuretic peptide (BNP): 149.9 pg/mL — ABNORMAL HIGH (ref 0–125)

## 2014-08-14 LAB — HCG, QUANTITATIVE, PREGNANCY: hCG, Beta Chain, Quant, S: 735 m[IU]/mL — ABNORMAL HIGH (ref <5)

## 2014-08-14 MED ORDER — ENOXAPARIN SODIUM 40 MG/0.4ML ~~LOC~~ SOLN: 40.0000 mg | SUBCUTANEOUS | Status: DC

## 2014-08-14 MED ORDER — HYDROMORPHONE HCL 1 MG/ML IJ SOLN
1.0000 mg | Freq: Once | INTRAMUSCULAR | Status: AC
  Administered 2014-08-14: 1 mg via INTRAVENOUS
  Filled 2014-08-14: qty 1

## 2014-08-14 MED ORDER — IOHEXOL 350 MG/ML SOLN
100.0000 mL | Freq: Once | INTRAVENOUS | Status: AC | PRN
  Administered 2014-08-14: 100 mL via INTRAVENOUS

## 2014-08-14 MED ORDER — SODIUM CHLORIDE 0.9 % IJ SOLN
3.0000 mL | Freq: Two times a day (BID) | INTRAMUSCULAR | Status: DC
  Administered 2014-08-15: 3 mL via INTRAVENOUS

## 2014-08-14 MED ORDER — SODIUM CHLORIDE 0.9 % IV BOLUS (SEPSIS)
1000.0000 mL | INTRAVENOUS | Status: DC
  Administered 2014-08-14: 1000 mL via INTRAVENOUS

## 2014-08-14 MED ORDER — DEXTROSE 5 % IV SOLN
500.0000 mg | INTRAVENOUS | Status: DC
  Administered 2014-08-15: 500 mg via INTRAVENOUS
  Filled 2014-08-14: qty 500

## 2014-08-14 MED ORDER — CEFTRIAXONE SODIUM IN DEXTROSE 20 MG/ML IV SOLN: 1.0000 g | INTRAVENOUS | Status: DC

## 2014-08-14 MED ORDER — ENOXAPARIN SODIUM 60 MG/0.6ML ~~LOC~~ SOLN
50.0000 mg | SUBCUTANEOUS | Status: DC
  Administered 2014-08-14 – 2014-08-15 (×2): 50 mg via SUBCUTANEOUS
  Filled 2014-08-14 (×3): qty 0.6

## 2014-08-14 MED ORDER — SODIUM CHLORIDE 0.9 % IV SOLN
INTRAVENOUS | Status: AC
  Administered 2014-08-14: 22:00:00 via INTRAVENOUS
  Administered 2014-08-15: 125 mL/h via INTRAVENOUS

## 2014-08-14 MED ORDER — SODIUM CHLORIDE 0.9 % IV BOLUS (SEPSIS)
1000.0000 mL | Freq: Once | INTRAVENOUS | Status: AC
  Administered 2014-08-14: 1000 mL via INTRAVENOUS

## 2014-08-14 MED ORDER — ONDANSETRON HCL 4 MG/2ML IJ SOLN: 4.0000 mg | Freq: Four times a day (QID) | INTRAMUSCULAR | Status: DC | PRN

## 2014-08-14 MED ORDER — ONDANSETRON HCL 4 MG PO TABS: 4.0000 mg | ORAL_TABLET | Freq: Four times a day (QID) | ORAL | Status: DC | PRN

## 2014-08-14 MED ORDER — ACETAMINOPHEN 325 MG PO TABS
650.0000 mg | ORAL_TABLET | Freq: Four times a day (QID) | ORAL | Status: DC | PRN
  Administered 2014-08-14 – 2014-08-15 (×2): 650 mg via ORAL
  Filled 2014-08-14 (×2): qty 2

## 2014-08-14 MED ORDER — CEFTRIAXONE SODIUM 1 G IJ SOLR
1.0000 g | Freq: Once | INTRAMUSCULAR | Status: AC
  Administered 2014-08-14: 1 g via INTRAVENOUS
  Filled 2014-08-14: qty 10

## 2014-08-14 MED ORDER — ACETAMINOPHEN 650 MG RE SUPP: 650.0000 mg | Freq: Four times a day (QID) | RECTAL | Status: DC | PRN

## 2014-08-14 MED ORDER — DEXTROSE 5 % IV SOLN
500.0000 mg | Freq: Once | INTRAVENOUS | Status: AC
  Administered 2014-08-14: 500 mg via INTRAVENOUS
  Filled 2014-08-14: qty 500

## 2014-08-14 NOTE — Progress Notes (Signed)
  CARE MANAGEMENT ED NOTE 08/14/2014  Patient:  Katherine Douglas, Katherine Douglas   Account Number:  [id number]  Date Initiated:  08/14/2014  Documentation initiated by:  Livia Snellen  Subjective/Objective Assessment:   Patient presents to Ed with chest pain and shortness of breath for one day.     Subjective/Objective Assessment Detail:   Chest xray: Left upper low density is most consistent with pneumonia in this age  group. Recommend followup radiographs to ensure resolution and  exclude underlying mass.  WBC 39, lactid acid 2.89, HR 125     Action/Plan:   Action/Plan Detail:   Anticipated DC Date:       Status Recommendation to Physician:   Result of Recommendation:    Other ED Services  Consult Working [place]  CM consult  Other  PCP issues    Choice offered to / List presented to:            Status of service:  Completed, signed off  ED Comments:   ED Comments Detail:  EDCM spoke to patient at bedside.  Patient confirms she has Medicaid insurnace.  [facility]  Patient reports she wants to get another doctor because she can never get an appointment at [facility].  Patient requesting a list of pcp who accept Medicaid insurnace in [facility].  Patient is aware that she will have to contact the DSS to have her pcp changed.  Patient taken to unit prior to [facility] giving her a lsit of Medicaid pcps.  If CM can arrange an appointment for patient, maybe patient will reconsider changing pcps.  no further EDCM needs at this time.

## 2014-08-14 NOTE — ED Notes (Signed)
Patient had a POS POC. Aline Brochure, EDP made aware.

## 2014-08-14 NOTE — ED Notes (Signed)
Pt reports chest pain that started yesterday that has increased today. Numbness to left jaw and left arm. Pt also reports blurry vision. Pt is sweating and says she "get's hot and cold in a minute".

## 2014-08-14 NOTE — Consult Note (Signed)
Name: Katherine Douglas MRN: [id number] DOB: [birth year]    ADMISSION DATE:  08/14/2014 CONSULTATION DATE:  08/14/2014  REFERRING MD :  [facility]  CHIEF COMPLAINT:  SOB, chest pain  BRIEF PATIENT DESCRIPTION: 33 y.o. F presented to [facility] ED with chest pain and SOB x 1 day.  In ED, CXR revealed LUL infiltrate concerning for PNA (though question pulmonary infarct).  In addition, hCG positive.  During admission, pt remained tachycardic and tachypneic, [facility] admitting MD concerned for PE so consulted PCCM for recs.  SIGNIFICANT EVENTS  11/19 - admitted with CAP, hCG positive.  PCCM consulted for concern of PE  STUDIES:  CXR 11/19 >>> LUL infiltrate.  Question pulmonary infarct.   HISTORY OF PRESENT ILLNESS:  Katherine Douglas is a 33 y.o. F with PMH of panic attacks and DM.  She presented to [facility] ED on 11/19 for evaluation of chest pain that began suddenly just 1 day prior.  Pain awoke her from her sleep, was sharp and stabbing in nature, located under left breast.  She took pepto-bismol because she presumed it was just gas; however, pain persisted, hence she came to ED for further evaluation.  Pain was associated with SOB, nausea, diaphoresis, numbness in left arm and left shoulder.  She had not tried any additional meds beside pepto. No similar symptoms in the past.  Denies any fevers/chills/sweats, productive cough.  She reports that she was in her USOH prior to this and is certain that chest pain and SOB began abruptly.  In ED, CXR revealed LUL infiltrate, ? PNA vs pulmonary infarct.  HCG was also positive.  During admission, [facility] admitting MD was concerned for PE; hence, PCCM was consulted.  Pt does report that she smokes a pack of cigarettes weekly.  She denies any prior hx of DVT or PE, recent periods of prolonged immobilization or  long car / plane rides, recent surgery, hemoptysis, LE or UE swelling, hx of malignancy or clotting disorders.  No family hx of clotting disorders.  PAST MEDICAL HISTORY :    has a past medical history of Stress; Panic attacks; and Diabetes mellitus without complication.  has past surgical history that includes Induced abortion. Prior to Admission medications   Not on File   Allergies  Allergen Reactions  . Other Anaphylaxis    Crawfish   . Peanut-Containing Drug Products Itching  . Penicillins Itching  . Strawberry Hives, Itching and Rash    FAMILY HISTORY:  family history includes Diabetes in her father; Heart failure in her mother; Hypertension in her mother. SOCIAL HISTORY:  reports that she has been smoking Cigarettes.  She has a 4.5 pack-year smoking history. She has never used smokeless tobacco. She reports that she does not drink alcohol or use illicit drugs.  REVIEW OF SYSTEMS:   All negative; except for those that are bolded, which indicate positives.  Constitutional: weight loss, weight gain, night sweats, fevers, chills, fatigue, weakness.  HEENT: headaches, sore throat, sneezing, nasal congestion, post nasal drip, difficulty swallowing, tooth/dental problems, visual complaints, visual changes, ear aches. Neuro: difficulty with speech, weakness, numbness, ataxia. CV:  chest pain, orthopnea, PND, swelling in lower extremities, dizziness, palpitations, syncope.  Resp: cough, hemoptysis, dyspnea, wheezing. GI  heartburn, indigestion, abdominal pain, nausea, vomiting, diarrhea, constipation, change in bowel habits, loss of appetite, hematemesis, melena, hematochezia.  GU: dysuria, change in color of urine, urgency or frequency, flank pain, hematuria. MSK: joint pain or swelling, decreased range of motion. Psych: change in mood or affect, depression,  anxiety, suicidal ideations, homicidal ideations. Skin: rash, itching, bruising.   SUBJECTIVE:  Still experiencing some chest discomfort.  VITAL SIGNS: Temp:  [98.2 F (36.8 C)-99.5 F (37.5 C)] 99.5 F (37.5 C) (11/19 2008) Pulse Rate:  [118-147] 123 (11/19 2008) Resp:  [16-28] 28 (11/19  2008) BP: (108-131)/(29-77) 131/68 mmHg (11/19 2008) SpO2:  [95 %-100 %] 97 % (11/19 2008) Weight:  [100.699 kg (222 lb)-106.8 kg (235 lb 7.2 oz)] 106.8 kg (235 lb 7.2 oz) (11/19 2008)  PHYSICAL EXAMINATION: General: Obese female, resting in bed, in NAD. Neuro: A&O x 3, non-focal.  HEENT: [place]/AT. PERRL, sclerae anicteric. Cardiovascular: RRR, no M/R/G.  Lungs: Respirations even and unlabored.  BS distant, CTA. Abdomen: BS x 4, soft, NT/ND.  Musculoskeletal: No gross deformities, no edema, negative homans sign. Skin: Intact, warm, no rashes.   Recent Labs Lab 08/14/14 1532  NA 135*  K 4.0  CL 98  CO2 22  BUN 9  CREATININE 0.93  GLUCOSE 131*    Recent Labs Lab 08/14/14 1532  HGB 9.8*  HCT 31.2*  WBC 39.0*  PLT 562*   Dg Chest 2 View (if Patient Has Fever And/or Copd)  08/14/2014   CLINICAL DATA:  Short of breath, left-sided chest pain  EXAM: CHEST  2 VIEW  COMPARISON:  Radiograph 04/2007  FINDINGS: Normal cardiac silhouette. There is a density in the left upper lobe measuring 5 cm. No pleural fluid or pneumothorax. No aggressive osseous lesion.  IMPRESSION: Left upper low density is most consistent with pneumonia in this age group. Recommend followup radiographs to ensure resolution and exclude underlying mass.   Electronically Signed   By: Suzy Bouchard M.D.   On: 08/14/2014 16:16    ASSESSMENT / PLAN:  Concern for PE - Well's Score low at 1.5 but this is unreliable in pregnancy; however, onset and nature of symptoms not convincing that this is just a PNA. Moderate suspicion for PE pre-test ? CAP Chest pain - initial troponin negative. hCG positive (new discovery) Recs: Given abnormal CXR, V/Q of no use; therefore, will go ahead and order CTA chest to r/o PE (with caution to radiology to ensure complete lead protection etc. given pregnancy). If CTA is positive for PE, will likely need enoxaparin given pregnancy (would discuss with pharmacy, note Eliquis also class  B). Continue azithromycin, rocephin empirically for now.  If CT-PA is positive, suspect that the infiltrate is a pulm infarct Check sputum culture, urine strep antigen, urine legionella antigen. Pulmonary hygiene. Trend troponins. CXR when needed > will try to minimize exposure of fetus   Montey Hora, [facility] Pgr: [phone]  or [phone] 08/14/2014, 9:36 PM   Attending Note:  I have examined patient, reviewed labs, studies, notes. I have discussed the case with Junius Roads and I agree with his note as amended above. She is tachycardic on exam but otherwise unremarjable. She has L CP that corresponds with a L infiltrate on CXR. Both PE and CAP are in the DDx. Pre-test suspicion for PE is at least moderate. She needs imaging - we have ordered. Will use enoxaparin for initial anti-coag if PE found.   Baltazar Apo, MD, PhD 08/14/2014, 10:20 PM [facility] [phone] or if no answer [phone]

## 2014-08-14 NOTE — ED Provider Notes (Signed)
CSN: [id number]     Arrival date & time 08/14/14  1500 History   First MD Initiated Contact with Patient 08/14/14 1600     Chief Complaint  Patient presents with  . Chest Pain     (Consider location/radiation/quality/duration/timing/severity/associated sxs/prior Treatment) HPI Katherine Douglas is a 33 y.o. female with a history of panic attacks comes in for evaluation of chest pain. She states yesterday morning at 7 AM she was woken up with a sharp stabbing pain under her left breast that has been persistent through today. She tried Pepto because she assumed it was gas but that did not improve the pain so she came to the ED for further evaluation. She reports associated shortness of breath, nausea, diaphoresis, numbness in her left arm, pain in her left shoulder. She has not taken anything else for the discomfort and there are no other modifying factors. She admits to smoking a pack of cigarettes every other week. Denies cough/hemoptysis, abdominal pain, no leg swelling  Past Medical History  Diagnosis Date  . Diabetes mellitus without complication   . Stress   . Panic attacks    Past Surgical History  Procedure Laterality Date  . Induced abortion     Family History  Problem Relation Age of Onset  . Hypertension Mother   . Heart failure Mother   . Diabetes Father    History  Substance Use Topics  . Smoking status: Current Every Day Smoker    Types: Cigarettes  . Smokeless tobacco: Not on file  . Alcohol Use: Yes     Comment: occasionally   OB History    No data available     Review of Systems  Constitutional: Positive for diaphoresis. Negative for fever.  HENT: Negative for sore throat.   Eyes: Negative for visual disturbance.  Respiratory: Positive for shortness of breath.   Cardiovascular: Positive for chest pain.  Gastrointestinal: Positive for nausea. Negative for abdominal pain.  Endocrine: Negative for polyuria.  Genitourinary: Negative for dysuria.   Musculoskeletal: Positive for myalgias.  Skin: Negative for rash.  Neurological: Positive for numbness. Negative for headaches.      Allergies  Other; Peanut-containing drug products; Penicillins; and Strawberry  Home Medications   Prior to Admission medications   Not on File   BP 109/69 mmHg  Pulse 147  Temp(Src) 98.2 F (36.8 C) (Oral)  Resp 25  SpO2 97%  LMP 07/14/2014 Physical Exam  Constitutional: She is oriented to person, place, and time. She appears well-developed and well-nourished.  HENT:  Head: Normocephalic and atraumatic.  Mouth/Throat: Oropharynx is clear and moist. No oropharyngeal exudate.  Eyes: Conjunctivae and EOM are normal. Pupils are equal, round, and reactive to light. Right eye exhibits no discharge. Left eye exhibits no discharge. No scleral icterus.  Neck: Normal range of motion. Neck supple. No JVD present. No tracheal deviation present.  Cardiovascular: Regular rhythm and normal heart sounds.  Exam reveals no gallop and no friction rub.   No murmur heard. Patient is tachycardic to the low 130s  Pulmonary/Chest: Effort normal and breath sounds normal. No respiratory distress. She has no wheezes. She has no rales.  Abdominal: Soft. She exhibits no distension and no mass. There is no tenderness. There is no rebound and no guarding.  Musculoskeletal: Normal range of motion. She exhibits no edema or tenderness.  No overt erythema or swelling to lower extremities, no tenderness. Compartments soft.  Neurological: She is alert and oriented to person, place, and time.  Cranial  Nerves II-XII grossly intact  Skin: Skin is warm and dry. No rash noted.  Psychiatric: She has a normal mood and affect.  Nursing note and vitals reviewed.   ED Course  Procedures (including critical care time) Labs Review Labs Reviewed  CBC - Abnormal; Notable for the following:    WBC 39.0 (*)    Hemoglobin 9.8 (*)    HCT 31.2 (*)    MCV 74.5 (*)    MCH 23.4 (*)    RDW  16.3 (*)    Platelets 562 (*)    All other components within normal limits  BASIC METABOLIC PANEL  I-STAT TROPOININ, ED    Imaging Review Dg Chest 2 View (if Patient Has Fever And/or Copd)  08/14/2014   CLINICAL DATA:  Short of breath, left-sided chest pain  EXAM: CHEST  2 VIEW  COMPARISON:  Radiograph 04/2007  FINDINGS: Normal cardiac silhouette. There is a density in the left upper lobe measuring 5 cm. No pleural fluid or pneumothorax. No aggressive osseous lesion.  IMPRESSION: Left upper low density is most consistent with pneumonia in this age group. Recommend followup radiographs to ensure resolution and exclude underlying mass.   Electronically Signed   By: Suzy Bouchard M.D.   On: 08/14/2014 16:16   Ct Angio Chest Pe W/cm &/or Wo Cm  08/14/2014   CLINICAL DATA:  Left-sided chest pain with known left upper lobe infiltrate  EXAM: CT ANGIOGRAPHY CHEST WITH CONTRAST  TECHNIQUE: Multidetector CT imaging of the chest was performed using the standard protocol during bolus administration of intravenous contrast. Multiplanar CT image reconstructions and MIPs were obtained to evaluate the vascular anatomy.  CONTRAST:  100 mL Omnipaque 350.  COMPARISON:  None.  FINDINGS: The right lung is well aerated and demonstrates no focal infiltrate. Significant left upper lobe and lingular infiltrate is noted corresponding to that seen on recent plain film examination. This is consistent with the patient's given clinical history of left-sided chest pain.  The thoracic inlet shows the thoracic aorta to be within normal limits. The contrast bolus is significantly limited due to the patient's body habitus. No large central pulmonary embolus is noted. Repeat imaging was not performed due to the patient's underlying pregnant state. No hilar or mediastinal adenopathy is seen. The abdomen is within normal limits as visualized. No acute bony abnormality is noted.  Review of the MIP images confirms the above findings.   IMPRESSION: Large left upper lobe and lingular infiltrate consistent with pneumonia.  No gross pulmonary emboli are identified although the imaging technique is somewhat limited secondary to patient's body habitus. Repeat imaging was not pursued due to the patient's current pregnant state as well as the large pneumonia and likely being the etiology of the patient's underlying abnormality.   Electronically Signed   By: Inez Catalina M.D.   On: 08/14/2014 23:12     EKG Interpretation None      MDM  Vitals stable, pt persistently tachycardic. Afebrile Pt resting comfortably in ED. Pain managed in ED. Sepsis orders initiated due to infiltrate seen on chest x-ray, associated tachycardia with leukocytosis of 39.0, lactic acid 2.89 Ceftriaxone and azythromycin initiated in ED Blood cultures obtained x2 prior to ABX  Patient found to be pregnant, Quant of 735 Imaging--chest x-ray shows large infiltrate on left chest.   Discussed patient presentation and course with Dr. Aline Brochure who also saw and evaluated the patient. Decision made to consult hospitalist. Patient admitted  Final diagnoses:  SOB (shortness of breath)  Chest pain, unspecified chest pain type  Tachycardia        Verl Dicker, PA-C 08/15/14 1229  Pamella Pert, MD 08/15/14 2136

## 2014-08-14 NOTE — Progress Notes (Signed)
Brief Pharmacy note: Rocephin  Rocephin not renally excreted, needs no monitoring or dose adjustments  Rocephin 1gm q24 continued  Pharmacy will sign off.  Thank you,   Minda Ditto PharmD Pager [phone] 08/14/2014, 9:36 PM

## 2014-08-14 NOTE — H&P (Addendum)
[facility] History and Physical  Katherine Douglas ION:[id number] DOB: 1981-02-28 DOA: 08/14/2014  Referring physician: ER physician. PCP: No PCP Per Patient  Chief Complaint: Chest pain and shortness of breath.  HPI: Katherine Douglas is a 33 y.o. female with history of gestational diabetes mellitus presented to the ER because of persistent chest pain and shortness of breath. Patient states he had started developing chest pain last midnight which woke her up from sleep. Chest pain was retrosternal radiating to both shoulders present even at rest sharp sometimes pressure-like associated with shortness of breath. Later on she slept and when she woke up in the morning she was drenched in sweat. During the course of the day patient again started developing chest pain and she required to take rest on the low. Pain was more on lying on the left side. Patient came to the ER and was found to be febrile and had significantly elevated leukocytosis. Chest x-ray was showing left upper lobe density. Patient was tachycardic in the 130s and tachypneic. Blood cultures were obtained and was started on empiric antibiotics for pneumonia. Patient was found to be pregnant and patient states her last menstrual cycle was last month. Initially ER physician was planning to get a CT angiogram of the chest to rule out PE but was held because of patient being positive for pregnancy. Patient denies any recent travel sick contacts. Patient does smoke cigarettes. Denies any swelling in the legs. Denies any nausea vomiting abdominal pain diarrhea.   Review of Systems: As presented in the history of presenting illness, rest negative.  Past Medical History  Diagnosis Date  . Stress   . Panic attacks   . Diabetes mellitus without complication     pt states was gestational   Past Surgical History  Procedure Laterality Date  . Induced abortion     Social History:  reports that she has been smoking Cigarettes.  She  has a 4.5 pack-year smoking history. She has never used smokeless tobacco. She reports that she does not drink alcohol or use illicit drugs. Where does patient live home. Can patient participate in ADLs? Yes.  Allergies  Allergen Reactions  . Other Anaphylaxis    Crawfish   . Peanut-Containing Drug Products Itching  . Penicillins Itching  . Strawberry Hives, Itching and Rash    Family History:  Family History  Problem Relation Age of Onset  . Hypertension Mother   . Heart failure Mother   . Diabetes Father       Prior to Admission medications   Not on File    Physical Exam: Filed Vitals:   08/14/14 1900 08/14/14 1930 08/14/14 1948 08/14/14 2008  BP: 124/71 120/49 120/49 131/68  Pulse: 118 124 125 123  Temp:    99.5 F (37.5 C)  TempSrc:    Oral  Resp:   16 28  Height:    5\' 3"  (1.6 m)  Weight:    106.8 kg (235 lb 7.2 oz)  SpO2: 96% 96% 95% 97%     General:  Well-developed and nourished.  Eyes: Anicteric no pallor.  ENT: No discharge from the ears eyes nose and mouth.  Neck: No mass felt no JVD appreciated.  Cardiovascular: S1 and S2 heard.  Respiratory: No rhonchi or crepitations.  Abdomen: Soft nontender bowel sounds present.  Skin: No rash.  Musculoskeletal: No edema.  Psychiatric: Appears normal.  Neurologic: Alert awake oriented to time place and person. Moves all his activities.  Labs on Admission:  Basic Metabolic Panel:  Recent Labs Lab 08/14/14 1532  NA 135*  K 4.0  CL 98  CO2 22  GLUCOSE 131*  BUN 9  CREATININE 0.93  CALCIUM 9.4   Liver Function Tests: No results for input(s): AST, ALT, ALKPHOS, BILITOT, PROT, ALBUMIN in the last 168 hours. No results for input(s): LIPASE, AMYLASE in the last 168 hours. No results for input(s): AMMONIA in the last 168 hours. CBC:  Recent Labs Lab 08/14/14 1532  WBC 39.0*  HGB 9.8*  HCT 31.2*  MCV 74.5*  PLT 562*   Cardiac Enzymes: No results for input(s): CKTOTAL, CKMB, CKMBINDEX,  TROPONINI in the last 168 hours.  BNP (last 3 results) No results for input(s): PROBNP in the last 8760 hours. CBG: No results for input(s): GLUCAP in the last 168 hours.  Radiological Exams on Admission: Dg Chest 2 View (if Patient Has Fever And/or Copd)  08/14/2014   CLINICAL DATA:  Short of breath, left-sided chest pain  EXAM: CHEST  2 VIEW  COMPARISON:  Radiograph 04/2007  FINDINGS: Normal cardiac silhouette. There is a density in the left upper lobe measuring 5 cm. No pleural fluid or pneumothorax. No aggressive osseous lesion.  IMPRESSION: Left upper low density is most consistent with pneumonia in this age group. Recommend followup radiographs to ensure resolution and exclude underlying mass.   Electronically Signed   By: Suzy Bouchard M.D.   On: 08/14/2014 16:16    EKG: Independently reviewed. Sinus tachycardia.  Assessment/Plan Principal Problem:   Sepsis Active Problems:   Chest pain   Pneumonia   Microcytic anemia   Tobacco abuse   1. Sepsis secondary to pneumonia - at this time after blood cultures were obtained patient has been placed on ceftriaxone and Zithromax. Check urine for Legionella and strep antigen and check influenza PCR. Check HIV. Since patient has significant leukocytosis and still tachycardic I have consulted critical care for further input on patient's care. Continue with aggressive IV hydration for now. 2. Microcytic hypochromic anemia - check an anemia panel. Follow CBC. 3. Tobacco abuse - patient strongly advised to quit smoking. 4. History of gestational diabetes mellitus - follow metabolic panel closely. 5. Pregnancy positive - patient states her last menstrual cycle was a month ago. Message conveyed to the patient about the pregnancy.  I have discussed with pharmacy with regarding to patient's medications being safe with pregnancy.  Code Status: Full code.  Family Communication: None.  Disposition Plan: Admit to inpatient.     Tymon Nemetz N. [facility] Pager [phone].  If 7PM-7AM, please contact night-coverage [email or web address] Password [email or web address] 08/14/2014, 9:22 PM

## 2014-08-15 DIAGNOSIS — R079 Chest pain, unspecified: Secondary | ICD-10-CM

## 2014-08-15 DIAGNOSIS — R0602 Shortness of breath: Secondary | ICD-10-CM

## 2014-08-15 DIAGNOSIS — R000 Tachycardia, unspecified: Secondary | ICD-10-CM

## 2014-08-15 DIAGNOSIS — J189 Pneumonia, unspecified organism: Secondary | ICD-10-CM

## 2014-08-15 LAB — HIV ANTIBODY (ROUTINE TESTING W REFLEX): HIV: NONREACTIVE

## 2014-08-15 LAB — COMPREHENSIVE METABOLIC PANEL
ALT: 9 U/L (ref 0–35)
ANION GAP: 14 (ref 5–15)
AST: 11 U/L (ref 0–37)
Albumin: 2.5 g/dL — ABNORMAL LOW (ref 3.5–5.2)
Alkaline Phosphatase: 103 U/L (ref 39–117)
BILIRUBIN TOTAL: 0.5 mg/dL (ref 0.3–1.2)
BUN: 8 mg/dL (ref 6–23)
CO2: 21 mEq/L (ref 19–32)
Calcium: 8.5 mg/dL (ref 8.4–10.5)
Chloride: 101 mEq/L (ref 96–112)
Creatinine, Ser: 0.79 mg/dL (ref 0.50–1.10)
GFR calc Af Amer: >90 mL/min (ref >90)
GFR calc non Af Amer: >90 mL/min (ref >90)
GLUCOSE: 117 mg/dL — AB (ref 70–99)
POTASSIUM: 3.6 meq/L — AB (ref 3.7–5.3)
Sodium: 136 mEq/L — ABNORMAL LOW (ref 137–147)
Total Protein: 6.3 g/dL (ref 6.0–8.3)

## 2014-08-15 LAB — CBC WITH DIFFERENTIAL/PLATELET
BASOS ABS: 0 10*3/uL (ref 0.0–0.1)
Basophils Relative: 0 % (ref 0–1)
EOS ABS: 0 10*3/uL (ref 0.0–0.7)
Eosinophils Relative: 0 % (ref 0–5)
HCT: 27.3 % — ABNORMAL LOW (ref 36.0–46.0)
Hemoglobin: 8.6 g/dL — ABNORMAL LOW (ref 12.0–15.0)
LYMPHS PCT: 7 % — AB (ref 12–46)
Lymphs Abs: 2.5 10*3/uL (ref 0.7–4.0)
MCH: 23.3 pg — ABNORMAL LOW (ref 26.0–34.0)
MCHC: 31.5 g/dL (ref 30.0–36.0)
MCV: 74 fL — ABNORMAL LOW (ref 78.0–100.0)
MONO ABS: 2.5 10*3/uL — AB (ref 0.1–1.0)
Monocytes Relative: 7 % (ref 3–12)
NEUTROS ABS: 30.4 10*3/uL — AB (ref 1.7–7.7)
Neutrophils Relative %: 86 % — ABNORMAL HIGH (ref 43–77)
Platelets: 464 10*3/uL — ABNORMAL HIGH (ref 150–400)
RBC: 3.69 MIL/uL — ABNORMAL LOW (ref 3.87–5.11)
RDW: 16.2 % — AB (ref 11.5–15.5)
WBC: 35.4 10*3/uL — ABNORMAL HIGH (ref 4.0–10.5)

## 2014-08-15 LAB — IRON AND TIBC: UIBC: 351 ug/dL (ref 125–400)

## 2014-08-15 LAB — HEPATIC FUNCTION PANEL
ALBUMIN: 2.6 g/dL — AB (ref 3.5–5.2)
ALT: 11 U/L (ref 0–35)
AST: 13 U/L (ref 0–37)
Alkaline Phosphatase: 98 U/L (ref 39–117)
BILIRUBIN TOTAL: 0.5 mg/dL (ref 0.3–1.2)
Bilirubin, Direct: <0.2 mg/dL (ref 0.0–0.3)
Total Protein: 6.6 g/dL (ref 6.0–8.3)

## 2014-08-15 LAB — URINE CULTURE: Colony Count: 50000

## 2014-08-15 LAB — TROPONIN I: Troponin I: <0.3 ng/mL (ref <0.30)

## 2014-08-15 LAB — INFLUENZA PANEL BY PCR (TYPE A & B)
H1N1 flu by pcr: NOT DETECTED
Influenza A By PCR: NEGATIVE
Influenza B By PCR: NEGATIVE

## 2014-08-15 LAB — LEGIONELLA ANTIGEN, URINE

## 2014-08-15 LAB — STREP PNEUMONIAE URINARY ANTIGEN: Strep Pneumo Urinary Antigen: NEGATIVE

## 2014-08-15 LAB — VITAMIN B12: VITAMIN B 12: 620 pg/mL (ref 211–911)

## 2014-08-15 LAB — FERRITIN: FERRITIN: 99 ng/mL (ref 10–291)

## 2014-08-15 LAB — FOLATE: Folate: 7.4 ng/mL

## 2014-08-15 LAB — TSH: TSH: 3.82 u[IU]/mL (ref 0.350–4.500)

## 2014-08-15 LAB — POCT PREGNANCY, URINE: PREG TEST UR: POSITIVE — AB

## 2014-08-15 MED ORDER — FLUTICASONE PROPIONATE 50 MCG/ACT NA SUSP
2.0000 | Freq: Every day | NASAL | Status: DC
  Administered 2014-08-15: 2 via NASAL
  Filled 2014-08-15: qty 16

## 2014-08-15 MED ORDER — CEFTRIAXONE SODIUM IN DEXTROSE 40 MG/ML IV SOLN
2.0000 g | INTRAVENOUS | Status: DC
  Administered 2014-08-15: 2 g via INTRAVENOUS
  Filled 2014-08-15: qty 50

## 2014-08-15 MED ORDER — POTASSIUM CHLORIDE CRYS ER 20 MEQ PO TBCR
40.0000 meq | EXTENDED_RELEASE_TABLET | Freq: Once | ORAL | Status: AC
  Administered 2014-08-15: 40 meq via ORAL
  Filled 2014-08-15: qty 2

## 2014-08-15 MED ORDER — DIPHENHYDRAMINE HCL 50 MG PO CAPS: 50.0000 mg | ORAL_CAPSULE | Freq: Once | ORAL | Status: DC

## 2014-08-15 MED ORDER — DIPHENHYDRAMINE HCL 50 MG PO CAPS
50.0000 mg | ORAL_CAPSULE | Freq: Once | ORAL | Status: AC
  Administered 2014-08-15: 50 mg via ORAL
  Filled 2014-08-15: qty 1

## 2014-08-15 MED ORDER — SALINE SPRAY 0.65 % NA SOLN
2.0000 | Freq: Two times a day (BID) | NASAL | Status: DC
  Administered 2014-08-15 (×2): 2 via NASAL
  Filled 2014-08-15: qty 44

## 2014-08-15 MED ORDER — IBUPROFEN 800 MG PO TABS
800.0000 mg | ORAL_TABLET | Freq: Four times a day (QID) | ORAL | Status: DC | PRN
  Administered 2014-08-15: 800 mg via ORAL
  Filled 2014-08-15 (×2): qty 1

## 2014-08-15 NOTE — Progress Notes (Signed)
Patient returned from CT, back in bed, remains on 2L [place] at this time. No respiratory difficulty noted.

## 2014-08-15 NOTE — Plan of Care (Signed)
Problem: Consults Goal: Skin Care Protocol Initiated - if Braden Score 18 or less If consults are not indicated, leave blank or document N/A  Outcome: Not Applicable Date Met:  15/40/08 Goal: Nutrition Consult-if indicated Outcome: Not Applicable Date Met:  67/61/95 Goal: Diabetes Guidelines if Diabetic/Glucose > 140 If diabetic or lab glucose is > 140 mg/dl - Initiate Diabetes/Hyperglycemia Guidelines & Document Interventions  Outcome: Progressing  Problem: Phase I Progression Outcomes Goal: Dyspnea controlled at rest Outcome: Progressing Goal: Pain controlled with appropriate interventions Outcome: Progressing Goal: OOB as tolerated unless otherwise ordered Outcome: Progressing Goal: First antibiotic given within 6hrs of admit Outcome: Completed/Met Date Met:  08/15/14 Goal: Confirm chest x-ray completed Outcome: Completed/Met Date Met:  08/15/14 Goal: Consider Infectious Disease Consult Outcome: Progressing Goal: Consider pulmonary consult Outcome: Completed/Met Date Met:  08/15/14 Goal: Code status addressed with pt/family Outcome: Completed/Met Date Met:  08/15/14 Goal: Initial discharge plan identified Outcome: Completed/Met Date Met:  08/15/14 Goal: Voiding-avoid urinary catheter unless indicated Outcome: Completed/Met Date Met:  08/15/14 Goal: Hemodynamically stable Outcome: Progressing Goal: Other Phase I Outcomes/Goals Outcome: Not Applicable Date Met:  09/32/67

## 2014-08-15 NOTE — Consult Note (Signed)
Name: Katherine Douglas MRN: [id number] DOB: 23-Aug-1981    ADMISSION DATE:  08/14/2014 CONSULTATION DATE:  08/14/2014  REFERRING MD :  TRH  CHIEF COMPLAINT:  SOB, chest pain  BRIEF PATIENT DESCRIPTION:  33 yo female presented with 1 day of dyspnea and chest pain.  Found to have Lt sided PNA.  She is in 1st trimester of pregnancy.  SIGNIFICANT EVENTS  11/19 - admitted with CAP, hCG positive.  PCCM consulted for concern of PE  STUDIES:  11/19 CT chest >> infiltrate LUL and lingula  SUBJECTIVE:   Not having chills any more.  Breathing better.  Still has cough with small amount of sputum.  C/o soreness in Lt chest.  VITAL SIGNS: Temp:  [98.2 F (36.8 C)-99.5 F (37.5 C)] 98.2 F (36.8 C) (11/20 0523) Pulse Rate:  [103-147] 103 (11/20 0523) Resp:  [16-28] 20 (11/20 0523) BP: (108-131)/(29-77) 112/66 mmHg (11/20 0523) SpO2:  [95 %-100 %] 98 % (11/20 0523) Weight:  [222 lb (100.699 kg)-235 lb 7.2 oz (106.8 kg)] 235 lb 7.2 oz (106.8 kg) (11/19 2008)  PHYSICAL EXAMINATION: General: no distress Neuro: normal strength HEENT: no sinus tenderness Cardiovascular: regular Lungs: diminished breath sounds, no wheeze Abdomen: soft, non teder Musculoskeletal: no edema Skin: no rashes  CBC Recent Labs     08/14/14  1532  08/14/14  2256  08/15/14  0342  WBC  39.0*  45.4*  35.4*  HGB  9.8*  8.8*  8.6*  HCT  31.2*  27.9*  27.3*  PLT  562*  433*  464*    BMET Recent Labs     08/14/14  1532  08/14/14  2256  08/15/14  0342  NA  135*   --   136*  K  4.0   --   3.6*  CL  98   --   101  CO2  22   --   21  BUN  9   --   8  CREATININE  0.93  0.94  0.79  GLUCOSE  131*   --   117*    Electrolytes Recent Labs     08/14/14  1532  08/15/14  0342  CALCIUM  9.4  8.5   Liver Enzymes Recent Labs     08/14/14  2256  08/15/14  0342  AST  13  11  ALT  11  9  ALKPHOS  98  103  BILITOT  0.5  0.5  ALBUMIN  2.6*  2.5*    Cardiac Enzymes Recent Labs     08/14/14  2130   08/14/14  2256  08/15/14  0342  08/15/14  0930  TROPONINI   --   <0.30  <0.30  <0.30  PROBNP  149.9*   --    --    --     Glucose No results for input(s): GLUCAP in the last 72 hours.  Imaging Dg Chest 2 View (if Patient Has Fever And/or Copd)  08/14/2014   CLINICAL DATA:  Short of breath, left-sided chest pain  EXAM: CHEST  2 VIEW  COMPARISON:  Radiograph 04/2007  FINDINGS: Normal cardiac silhouette. There is a density in the left upper lobe measuring 5 cm. No pleural fluid or pneumothorax. No aggressive osseous lesion.  IMPRESSION: Left upper low density is most consistent with pneumonia in this age group. Recommend followup radiographs to ensure resolution and exclude underlying mass.   Electronically Signed   By: Suzy Bouchard M.D.   On: 08/14/2014 16:16  Ct Angio Chest Pe W/cm &/or Wo Cm  08/14/2014   CLINICAL DATA:  Left-sided chest pain with known left upper lobe infiltrate  EXAM: CT ANGIOGRAPHY CHEST WITH CONTRAST  TECHNIQUE: Multidetector CT imaging of the chest was performed using the standard protocol during bolus administration of intravenous contrast. Multiplanar CT image reconstructions and MIPs were obtained to evaluate the vascular anatomy.  CONTRAST:  100 mL Omnipaque 350.  COMPARISON:  None.  FINDINGS: The right lung is well aerated and demonstrates no focal infiltrate. Significant left upper lobe and lingular infiltrate is noted corresponding to that seen on recent plain film examination. This is consistent with the patient's given clinical history of left-sided chest pain.  The thoracic inlet shows the thoracic aorta to be within normal limits. The contrast bolus is significantly limited due to the patient's body habitus. No large central pulmonary embolus is noted. Repeat imaging was not performed due to the patient's underlying pregnant state. No hilar or mediastinal adenopathy is seen. The abdomen is within normal limits as visualized. No acute bony abnormality is noted.   Review of the MIP images confirms the above findings.  IMPRESSION: Large left upper lobe and lingular infiltrate consistent with pneumonia.  No gross pulmonary emboli are identified although the imaging technique is somewhat limited secondary to patient's body habitus. Repeat imaging was not pursued due to the patient's current pregnant state as well as the large pneumonia and likely being the etiology of the patient's underlying abnormality.   Electronically Signed   By: Inez Catalina M.D.   On: 08/14/2014 23:12       ASSESSMENT / PLAN:  Community acquired pneumonia. Plan: Day 2 of rocephin, zithromax >> if she continues to improve, then likely can transition to oral Abx over the weekend Limit CXR in setting of pregnancy  Influenza PCR 11/19 >> Legionella Ag 11/19 >>  Sputum cx 11/19 >>  Blood cx 11/19 >>   Lt pleuritic chest pain >> from PNA. Plan: Prn tylenol, dilaudid D/c telemetry  Rhinitis. Plan: Flonase, nasal irrigation  1st trimester pregnancy. Plan: Per primary team  Leukocytosis >> likely from PNA. Anemia. Plan: F/u CBC  Will defer further management to hospitalist team.  PCCM will sign off.  Please call if additional help needed while she is in hospital.  Chesley Mires, MD [place] 08/15/2014, 11:36 AM Pager:  [phone] After 3pm call: [phone]

## 2014-08-15 NOTE — Progress Notes (Signed)
[facility]                                                                              Patient Demographics  Katherine Douglas, is a 33 y.o. female, DOB - [birth year], LYY:[id number]  Admit date - 08/14/2014   Admitting Physician Orson Eva, MD  Outpatient Primary MD for the patient is No PCP Per Patient  LOS - 1   Chief Complaint  Patient presents with  . Chest Pain      HPI on 08/14/2014 33 year old female history of gestational diabetes presented to the emergency department with complaints of persistent chest pain and shortness of breath which developed Wednesday prior to admission. Patient stated she woke up at midnight from sleep due to chest pain which was retrosternal, radiating to both shoulders and present at rest with sharp, pressure-like in nature, associated with shortness of breath. Patient went back to sleep, later woke up drenched in sweat.  Patient decided to come to the emergency department where she was found to be febrile and had significant leukocytosis. Chest x-ray showed left upper lobe density.  Patient was also tachycardic as well as tachypneic. Blood cultures were obtained, patient was started on empiric bionics for pneumonia. Patient was also found to be pregnant. CT angiogram to rule out PE was planned by ER physician however canceled due to her pregnancy. Patient denied any recent travel or sick contacts. She does admit to cigarette smoking. She denies any leg swelling, nausea, vomiting, abdominal pain, diarrhea upon admission.  Assessment & Plan   Sepsis secondary to community-acquired pneumonia -Patient placed on ceftriaxone and azithromycin -Pending urine Legionella and strep antigens, sputum culture -HIV nonreactive, Influenza negative -Leukocytosis improving -Continue IV hydration  Pleuritic Chest pain likely secondary to pneumonia -troponins cycled and negative -Chest CT negative for PE -Continue pain control  Microcytic hypochromic  anemia -Anemia panel: iron 10, ferritin 99 -Hb remains stable  Tobacco abuse -Smoking cessation counseling given  Gestational diabetes mellitus -on no medications -Should continue to follow up with her ob/gyn and PCP at discharge -Continue to monitor closely  Pregnancy -Will need to follow up with ob/gyn at discharge  Code Status: Full  Family Communication: None at bedside  Disposition Plan: Admitted, likely will discharge on 11/21  Time Spent in minutes   30 minutes  Procedures  None  Consults   PCCM  DVT Prophylaxis  Lovenox  Lab Results  Component Value Date   PLT 464* 08/15/2014    Medications  Scheduled Meds: . azithromycin  500 mg Intravenous Q24H  . cefTRIAXone (ROCEPHIN)  IV  2 g Intravenous Q24H  . enoxaparin (LOVENOX) injection  50 mg Subcutaneous Q24H  . fluticasone  2 spray Each Nare Daily  . sodium chloride  2 spray Each Nare BID  . sodium chloride  3 mL Intravenous Q12H   Continuous Infusions: . sodium chloride 125 mL/hr at 08/14/14 2130   PRN Meds:.acetaminophen **OR** acetaminophen, ondansetron **OR** ondansetron (ZOFRAN) IV  Antibiotics    Anti-infectives    Start     Dose/Rate Route Frequency Ordered Stop   08/15/14 1800  azithromycin (ZITHROMAX) 500 mg in dextrose 5 % 250 mL IVPB  500 mg250 mL/hr over 60 Minutes Intravenous Every 24 hours 08/14/14 2120     08/15/14 1700  cefTRIAXone (ROCEPHIN) 1 g in dextrose 5 % 50 mL IVPB - Premix  Status:  Discontinued     1 g100 mL/hr over 30 Minutes Intravenous Every 24 hours 08/14/14 2134 08/15/14 0837   08/15/14 1600  cefTRIAXone (ROCEPHIN) 2 g in dextrose 5 % 50 mL IVPB - Premix     2 g100 mL/hr over 30 Minutes Intravenous Every 24 hours 08/15/14 0837     08/14/14 1630  cefTRIAXone (ROCEPHIN) 1 g in dextrose 5 % 50 mL IVPB     1 g100 mL/hr over 30 Minutes Intravenous  Once 08/14/14 1629 08/14/14 1804   08/14/14 1630  azithromycin (ZITHROMAX) 500 mg in dextrose 5 % 250 mL IVPB     500 mg250  mL/hr over 60 Minutes Intravenous  Once 08/14/14 1629 08/14/14 2020        Subjective:   Levada Dy seen and examined today.  Patient states she is feeling much improved as compared to previous days. She denies any further chest pain. She does have cough which is somewhat productive. Patient denies any dizziness, palpitations, abdominal pain, nausea, vomiting.  Objective:   Filed Vitals:   08/14/14 1930 08/14/14 1948 08/14/14 2008 08/15/14 0523  BP: 120/49 120/49 131/68 112/66  Pulse: 124 125 123 103  Temp:   99.5 F (37.5 C) 98.2 F (36.8 C)  TempSrc:   Oral Oral  Resp:  16 28 20   Height:   5\' 3"  (1.6 m)   Weight:   106.8 kg (235 lb 7.2 oz)   SpO2: 96% 95% 97% 98%    Wt Readings from Last 3 Encounters:  08/14/14 106.8 kg (235 lb 7.2 oz)  09/27/13 97.523 kg (215 lb)     Intake/Output Summary (Last 24 hours) at 08/15/14 1223 Last data filed at 08/15/14 1308  Gross per 24 hour  Intake 2277.5 ml  Output    250 ml  Net 2027.5 ml    Exam  General: Well developed, well nourished, NAD, appears stated age  HEENT: NCAT, mucous membranes moist.   Cardiovascular: S1 S2 auscultated, no rubs, murmurs or gallops. Regular rate and rhythm.  Respiratory: Diminished breath sounds however no wheezing or crackles noted  Abdomen: Soft, obese, nontender, nondistended, + bowel sounds  Extremities: warm dry without cyanosis clubbing or edema  Neuro: AAOx3, no focal deficits  Skin: Without rashes exudates or nodules  Psych: Appropriate mood and affect  Data Review   Micro Results Recent Results (from the past 240 hour(s))  Blood Culture (routine x 2)     Status: None (Preliminary result)   Collection Time: 08/14/14  5:00 PM  Result Value Ref Range Status   Specimen Description BLOOD LEFT HAND  Final   Special Requests BOTTLES DRAWN AEROBIC AND ANAEROBIC 4ML  Final   Culture  Setup Time   Final    08/14/2014 22:24 Performed at [facility]    Culture    Final           BLOOD CULTURE RECEIVED NO GROWTH TO DATE CULTURE WILL BE HELD FOR 5 DAYS BEFORE ISSUING A FINAL NEGATIVE REPORT Performed at [facility]    Report Status PENDING  Incomplete  Blood Culture (routine x 2)     Status: None (Preliminary result)   Collection Time: 08/14/14  5:00 PM  Result Value Ref Range Status   Specimen Description BLOOD RIGHT HAND  Final   Special Requests BOTTLES DRAWN AEROBIC AND ANAEROBIC 5ML  Final   Culture  Setup Time   Final    08/14/2014 22:24 Performed at [facility]    Culture   Final           BLOOD CULTURE RECEIVED NO GROWTH TO DATE CULTURE WILL BE HELD FOR 5 DAYS BEFORE ISSUING A FINAL NEGATIVE REPORT Performed at [facility]    Report Status PENDING  Incomplete  Culture, expectorated sputum-assessment     Status: None   Collection Time: 08/14/14 11:09 PM  Result Value Ref Range Status   Specimen Description SPUTUM  Final   Special Requests NONE  Final   Sputum evaluation   Final    THIS SPECIMEN IS ACCEPTABLE. RESPIRATORY CULTURE REPORT TO FOLLOW.   Report Status 08/14/2014 FINAL  Final    Radiology Reports Dg Chest 2 View (if Patient Has Fever And/or Copd)  08/14/2014   CLINICAL DATA:  Short of breath, left-sided chest pain  EXAM: CHEST  2 VIEW  COMPARISON:  Radiograph 04/2007  FINDINGS: Normal cardiac silhouette. There is a density in the left upper lobe measuring 5 cm. No pleural fluid or pneumothorax. No aggressive osseous lesion.  IMPRESSION: Left upper low density is most consistent with pneumonia in this age group. Recommend followup radiographs to ensure resolution and exclude underlying mass.   Electronically Signed   By: Suzy Bouchard M.D.   On: 08/14/2014 16:16   Ct Angio Chest Pe W/cm &/or Wo Cm  08/14/2014   CLINICAL DATA:  Left-sided chest pain with known left upper lobe infiltrate  EXAM: CT ANGIOGRAPHY CHEST WITH CONTRAST  TECHNIQUE: Multidetector CT imaging of the chest was performed using  the standard protocol during bolus administration of intravenous contrast. Multiplanar CT image reconstructions and MIPs were obtained to evaluate the vascular anatomy.  CONTRAST:  100 mL Omnipaque 350.  COMPARISON:  None.  FINDINGS: The right lung is well aerated and demonstrates no focal infiltrate. Significant left upper lobe and lingular infiltrate is noted corresponding to that seen on recent plain film examination. This is consistent with the patient's given clinical history of left-sided chest pain.  The thoracic inlet shows the thoracic aorta to be within normal limits. The contrast bolus is significantly limited due to the patient's body habitus. No large central pulmonary embolus is noted. Repeat imaging was not performed due to the patient's underlying pregnant state. No hilar or mediastinal adenopathy is seen. The abdomen is within normal limits as visualized. No acute bony abnormality is noted.  Review of the MIP images confirms the above findings.  IMPRESSION: Large left upper lobe and lingular infiltrate consistent with pneumonia.  No gross pulmonary emboli are identified although the imaging technique is somewhat limited secondary to patient's body habitus. Repeat imaging was not pursued due to the patient's current pregnant state as well as the large pneumonia and likely being the etiology of the patient's underlying abnormality.   Electronically Signed   By: Inez Catalina M.D.   On: 08/14/2014 23:12    CBC  Recent Labs Lab 08/14/14 1532 08/14/14 2256 08/15/14 0342  WBC 39.0* 45.4* 35.4*  HGB 9.8* 8.8* 8.6*  HCT 31.2* 27.9* 27.3*  PLT 562* 433* 464*  MCV 74.5* 75.8* 74.0*  MCH 23.4* 23.9* 23.3*  MCHC 31.4 31.5 31.5  RDW 16.3* 16.2* 16.2*  LYMPHSABS  --   --  2.5  MONOABS  --   --  2.5*  EOSABS  --   --  0.0  BASOSABS  --   --  0.0    Chemistries   Recent Labs Lab 08/14/14 1532 08/14/14 2256 08/15/14 0342  NA 135*  --  136*  K 4.0  --  3.6*  CL 98  --  101  CO2 22  --   21  GLUCOSE 131*  --  117*  BUN 9  --  8  CREATININE 0.93 0.94 0.79  CALCIUM 9.4  --  8.5  AST  --  13 11  ALT  --  11 9  ALKPHOS  --  98 103  BILITOT  --  0.5 0.5   ------------------------------------------------------------------------------------------------------------------ estimated creatinine clearance is 117.2 mL/min (by C-G formula based on Cr of 0.79). ------------------------------------------------------------------------------------------------------------------ No results for input(s): HGBA1C in the last 72 hours. ------------------------------------------------------------------------------------------------------------------ No results for input(s): CHOL, HDL, LDLCALC, TRIG, CHOLHDL, LDLDIRECT in the last 72 hours. ------------------------------------------------------------------------------------------------------------------  Recent Labs  08/14/14 2200  TSH 3.820   ------------------------------------------------------------------------------------------------------------------  Recent Labs  08/14/14 2256  VITAMINB12 620  FOLATE 7.4  FERRITIN 99  TIBC Not calculated due to Iron <10.  IRON <10*  RETICCTPCT 1.1    Coagulation profile No results for input(s): INR, PROTIME in the last 168 hours.   Recent Labs  08/14/14 2115  DDIMER 0.46    Cardiac Enzymes  Recent Labs Lab 08/14/14 2256 08/15/14 0342 08/15/14 0930  TROPONINI <0.30 <0.30 <0.30   ------------------------------------------------------------------------------------------------------------------ Invalid input(s): POCBNP    Kynzlee Hucker D.O. on 08/15/2014 at 12:23 PM  Between 7am to 7pm - Pager - [phone]  After 7pm go to [email or web address] - password [email or web address]  And look for the night coverage person covering for me after hours  [facility] Office  [phone]

## 2014-08-15 NOTE — Progress Notes (Signed)
Patient off unit via wheelchair for CT of chest with RN, patient remains on tele, patient short of breath with ambulation in room from bed to chair. Placed on 2L  at this time.

## 2014-08-15 NOTE — Progress Notes (Signed)
Patient arrived from ED via stretcher, patient able to transfer from ED stretcher to bed. Patient did become short of breath with the actiivity  which resolved with rest. Patient is awake and alert at this time, complains of left side chest tightness rating 6/10 at this time. Dr.Tat paged to notify of patient arrival.

## 2014-08-16 LAB — BASIC METABOLIC PANEL
Anion gap: 9 (ref 5–15)
BUN: 10 mg/dL (ref 6–23)
CO2: 23 mEq/L (ref 19–32)
CREATININE: 0.75 mg/dL (ref 0.50–1.10)
Calcium: 8.5 mg/dL (ref 8.4–10.5)
Chloride: 109 mEq/L (ref 96–112)
GFR calc non Af Amer: >90 mL/min (ref >90)
Glucose, Bld: 101 mg/dL — ABNORMAL HIGH (ref 70–99)
Potassium: 4.3 mEq/L (ref 3.7–5.3)
Sodium: 141 mEq/L (ref 137–147)

## 2014-08-16 LAB — CBC
HCT: 28.2 % — ABNORMAL LOW (ref 36.0–46.0)
Hemoglobin: 8.7 g/dL — ABNORMAL LOW (ref 12.0–15.0)
MCH: 23.7 pg — ABNORMAL LOW (ref 26.0–34.0)
MCHC: 30.9 g/dL (ref 30.0–36.0)
MCV: 76.8 fL — AB (ref 78.0–100.0)
Platelets: 497 10*3/uL — ABNORMAL HIGH (ref 150–400)
RBC: 3.67 MIL/uL — ABNORMAL LOW (ref 3.87–5.11)
RDW: 16.7 % — AB (ref 11.5–15.5)
WBC: 17.6 10*3/uL — ABNORMAL HIGH (ref 4.0–10.5)

## 2014-08-16 MED ORDER — AZITHROMYCIN 250 MG PO TABS: 250.0000 mg | ORAL_TABLET | Freq: Every day | ORAL | Status: DC

## 2014-08-16 MED ORDER — FLUTICASONE PROPIONATE 50 MCG/ACT NA SUSP: 2.0000 | Freq: Every day | NASAL | Status: DC

## 2014-08-16 MED ORDER — AMOXICILLIN-POT CLAVULANATE 875-125 MG PO TABS: 1.0000 | ORAL_TABLET | Freq: Two times a day (BID) | ORAL | Status: DC

## 2014-08-16 NOTE — Plan of Care (Signed)
Problem: Phase I Progression Outcomes Goal: Dyspnea controlled at rest Outcome: Completed/Met Date Met:  08/16/14

## 2014-08-16 NOTE — Discharge Instructions (Signed)
Pneumonia Pneumonia is an infection of the lungs.  CAUSES Pneumonia may be caused by bacteria or a virus. Usually, these infections are caused by breathing infectious particles into the lungs (respiratory tract). SIGNS AND SYMPTOMS   Cough.  Fever.  Chest pain.  Increased rate of breathing.  Wheezing.  Mucus production. DIAGNOSIS  If you have the common symptoms of pneumonia, your health care provider will typically confirm the diagnosis with a chest X-ray. The X-ray will show an abnormality in the lung (pulmonary infiltrate) if you have pneumonia. Other tests of your blood, urine, or sputum may be done to find the specific cause of your pneumonia. Your health care provider may also do tests (blood gases or pulse oximetry) to see how well your lungs are working. TREATMENT  Some forms of pneumonia may be spread to other people when you cough or sneeze. You may be asked to wear a mask before and during your exam. Pneumonia that is caused by bacteria is treated with antibiotic medicine. Pneumonia that is caused by the influenza virus may be treated with an antiviral medicine. Most other viral infections must run their course. These infections will not respond to antibiotics.  HOME CARE INSTRUCTIONS   Cough suppressants may be used if you are losing too much rest. However, coughing protects you by clearing your lungs. You should avoid using cough suppressants if you can.  Your health care provider may have prescribed medicine if he or she thinks your pneumonia is caused by bacteria or influenza. Finish your medicine even if you start to feel better.  Your health care provider may also prescribe an expectorant. This loosens the mucus to be coughed up.  Take medicines only as directed by your health care provider.  Do not smoke. Smoking is a common cause of bronchitis and can contribute to pneumonia. If you are a smoker and continue to smoke, your cough may last several weeks after your  pneumonia has cleared.  A cold steam vaporizer or humidifier in your room or home may help loosen mucus.  Coughing is often worse at night. Sleeping in a semi-upright position in a recliner or using a couple pillows under your head will help with this.  Get rest as you feel it is needed. Your body will usually let you know when you need to rest. PREVENTION A pneumococcal shot (vaccine) is available to prevent a common bacterial cause of pneumonia. This is usually suggested for:  People over 65 years old.  Patients on chemotherapy.  People with chronic lung problems, such as bronchitis or emphysema.  People with immune system problems. If you are over 65 or have a high risk condition, you may receive the pneumococcal vaccine if you have not received it before. In some countries, a routine influenza vaccine is also recommended. This vaccine can help prevent some cases of pneumonia.You may be offered the influenza vaccine as part of your care. If you smoke, it is time to quit. You may receive instructions on how to stop smoking. Your health care provider can provide medicines and counseling to help you quit. SEEK MEDICAL CARE IF: You have a fever. SEEK IMMEDIATE MEDICAL CARE IF:   Your illness becomes worse. This is especially true if you are elderly or weakened from any other disease.  You cannot control your cough with suppressants and are losing sleep.  You begin coughing up blood.  You develop pain which is getting worse or is uncontrolled with medicines.  Any of the symptoms   which initially brought you in for treatment are getting worse rather than better. °· You develop shortness of breath or chest pain. °MAKE SURE YOU:  °· Understand these instructions. °· Will watch your condition. °· Will get help right away if you are not doing well or get worse. °Document Released: 09/12/2005 Document Revised: 01/27/2014 Document Reviewed: 12/02/2010 °[employer]® Patient Information ©2015  [employer]. This information is not intended to replace advice given to you by your health care provider. Make sure you discuss any questions you have with your health care provider. ° °Smoking Cessation °Quitting smoking is important to your health and has many advantages. However, it is not always easy to quit since nicotine is a very addictive drug. Oftentimes, people try 3 times or more before being able to quit. This document explains the best ways for you to prepare to quit smoking. Quitting takes hard work and a lot of effort, but you can do it. °ADVANTAGES OF QUITTING SMOKING °· You will live longer, feel better, and live better. °· Your body will feel the impact of quitting smoking almost immediately. °¨ Within 20 minutes, blood pressure decreases. Your pulse returns to its normal level. °¨ After 8 hours, carbon monoxide levels in the blood return to normal. Your oxygen level increases. °¨ After 24 hours, the chance of having a heart attack starts to decrease. Your breath, hair, and body stop smelling like smoke. °¨ After 48 hours, damaged nerve endings begin to recover. Your sense of taste and smell improve. °¨ After 72 hours, the body is virtually free of nicotine. Your bronchial tubes relax and breathing becomes easier. °¨ After 2 to 12 weeks, lungs can hold more air. Exercise becomes easier and circulation improves. °· The risk of having a heart attack, stroke, cancer, or lung disease is greatly reduced. °¨ After 1 year, the risk of coronary heart disease is cut in half. °¨ After 5 years, the risk of stroke falls to the same as a nonsmoker. °¨ After 10 years, the risk of lung cancer is cut in half and the risk of other cancers decreases significantly. °¨ After 15 years, the risk of coronary heart disease drops, usually to the level of a nonsmoker. °· If you are pregnant, quitting smoking will improve your chances of having a healthy baby. °· The people you live with, especially any children, will be  healthier. °· You will have extra money to spend on things other than cigarettes. °QUESTIONS TO THINK ABOUT BEFORE ATTEMPTING TO QUIT °You may want to talk about your answers with your health care provider. °· Why do you want to quit? °· If you tried to quit in the past, what helped and what did not? °· What will be the most difficult situations for you after you quit? How will you plan to handle them? °· Who can help you through the tough times? Your family? Friends? A health care provider? °· What pleasures do you get from smoking? What ways can you still get pleasure if you quit? °Here are some questions to ask your health care provider: °· How can you help me to be successful at quitting? °· What medicine do you think would be best for me and how should I take it? °· What should I do if I need more help? °· What is smoking withdrawal like? How can I get information on withdrawal? °GET READY °· Set a quit date. °· Change your environment by getting rid of all cigarettes, ashtrays, matches, and   lighters in your home, car, or work. Do not let people smoke in your home. °· Review your past attempts to quit. Think about what worked and what did not. °GET SUPPORT AND ENCOURAGEMENT °You have a better chance of being successful if you have help. You can get support in many ways. °· Tell your family, friends, and coworkers that you are going to quit and need their support. Ask them not to smoke around you. °· Get individual, group, or telephone counseling and support. Programs are available at local hospitals and health centers. Call your local health department for information about programs in your area. °· Spiritual beliefs and practices may help some smokers quit. °· Download a "quit meter" on your computer to keep track of quit statistics, such as how long you have gone without smoking, cigarettes not smoked, and money saved. °· Get a self-help book about quitting smoking and staying off tobacco. °LEARN NEW SKILLS  AND BEHAVIORS °· Distract yourself from urges to smoke. Talk to someone, go for a walk, or occupy your time with a task. °· Change your normal routine. Take a different route to work. Drink tea instead of coffee. Eat breakfast in a different place. °· Reduce your stress. Take a hot bath, exercise, or read a book. °· Plan something enjoyable to do every day. Reward yourself for not smoking. °· Explore interactive web-based programs that specialize in helping you quit. °GET MEDICINE AND USE IT CORRECTLY °Medicines can help you stop smoking and decrease the urge to smoke. Combining medicine with the above behavioral methods and support can greatly increase your chances of successfully quitting smoking. °· Nicotine replacement therapy helps deliver nicotine to your body without the negative effects and risks of smoking. Nicotine replacement therapy includes nicotine gum, lozenges, inhalers, nasal sprays, and skin patches. Some may be available over-the-counter and others require a prescription. °· Antidepressant medicine helps people abstain from smoking, but how this works is unknown. This medicine is available by prescription. °· Nicotinic receptor partial agonist medicine simulates the effect of nicotine in your brain. This medicine is available by prescription. °Ask your health care provider for advice about which medicines to use and how to use them based on your health history. Your health care provider will tell you what side effects to look out for if you choose to be on a medicine or therapy. Carefully read the information on the package. Do not use any other product containing nicotine while using a nicotine replacement product.  °RELAPSE OR DIFFICULT SITUATIONS °Most relapses occur within the first 3 months after quitting. Do not be discouraged if you start smoking again. Remember, most people try several times before finally quitting. You may have symptoms of withdrawal because your body is used to nicotine.  You may crave cigarettes, be irritable, feel very hungry, cough often, get headaches, or have difficulty concentrating. The withdrawal symptoms are only temporary. They are strongest when you first quit, but they will go away within 10-14 days. °To reduce the chances of relapse, try to: °· Avoid drinking alcohol. Drinking lowers your chances of successfully quitting. °· Reduce the amount of caffeine you consume. Once you quit smoking, the amount of caffeine in your body increases and can give you symptoms, such as a rapid heartbeat, sweating, and anxiety. °· Avoid smokers because they can make you want to smoke. °· Do not let weight gain distract you. Many smokers will gain weight when they quit, usually less than 10 pounds. Eat a healthy   diet and stay active. You can always lose the weight gained after you quit. °· Find ways to improve your mood other than smoking. °FOR MORE INFORMATION  °www.smokefree.gov  °Document Released: 09/06/2001 Document Revised: 01/27/2014 Document Reviewed: 12/22/2011 °[employer]® Patient Information ©2015 [employer]. This information is not intended to replace advice given to you by your health care provider. Make sure you discuss any questions you have with your health care provider. ° °

## 2014-08-16 NOTE — Plan of Care (Signed)
Problem: Phase I Progression Outcomes Goal: Consider Infectious Disease Consult Outcome: Completed/Met Date Met:  08/16/14  Problem: Phase II Progression Outcomes Goal: Encourage coughing & deep breathing Outcome: Completed/Met Date Met:  08/16/14 Goal: Wean O2 if indicated Outcome: Completed/Met Date Met:  08/16/14 Goal: Pain controlled Outcome: Completed/Met Date Met:  08/16/14 Goal: Review culture results with MD if needed Outcome: Completed/Met Date Met:  08/16/14 Goal: Progress activity as tolerated unless otherwise ordered Outcome: Completed/Met Date Met:  08/16/14 Goal: Discharge plan established Outcome: Completed/Met Date Met:  08/16/14 Goal: Tolerating diet Outcome: Completed/Met Date Met:  08/16/14 Goal: If HF, initiate HF Core Reminder Form Outcome: Completed/Met Date Met:  08/16/14 Goal: Other Phase II Outcomes/Goals Outcome: Completed/Met Date Met:  08/16/14  Problem: Phase III Progression Outcomes Goal: O2 sats > or equal to 93% on room air Outcome: Completed/Met Date Met:  08/16/14 Goal: Pain controlled on oral analgesia Outcome: Completed/Met Date Met:  08/16/14 Goal: Activity at appropriate level-compared to baseline (UP IN CHAIR FOR HEMODIALYSIS)  Outcome: Completed/Met Date Met:  08/16/14 Goal: Tolerating diet Outcome: Completed/Met Date Met:  08/16/14 Goal: Convert IV antibiotics to PO Outcome: Completed/Met Date Met:  08/16/14 Goal: Discharge plan remains appropriate-arrangements made Outcome: Completed/Met Date Met:  08/16/14 Goal: Other Phase III Outcomes/Goals Outcome: Completed/Met Date Met:  08/16/14  Problem: Discharge Progression Outcomes Goal: Barriers To Progression Addressed/Resolved Outcome: Completed/Met Date Met:  08/16/14 Goal: Discharge plan in place and appropriate Outcome: Completed/Met Date Met:  08/16/14 Goal: O2 sats at patient's baseline Outcome: Completed/Met Date Met:  08/16/14 Goal: Pain controlled with appropriate  interventions Outcome: Completed/Met Date Met:  08/16/14 Goal: Hemodynamically stable Outcome: Completed/Met Date Met:  69/45/03 Goal: Complications resolved/controlled Outcome: Completed/Met Date Met:  08/16/14 Goal: Tolerating diet Outcome: Completed/Met Date Met:  08/16/14 Goal: Activity appropriate for discharge plan Outcome: Completed/Met Date Met:  08/16/14 Goal: Vaccine documented on D/C instructions Outcome: Completed/Met Date Met:  08/16/14 Goal: Other Discharge Outcomes/Goals Outcome: Completed/Met Date Met:  08/16/14

## 2014-08-16 NOTE — Discharge Summary (Signed)
Physician Discharge Summary  Katherine Douglas MVE:[id number] DOB: 1981-04-23 DOA: 08/14/2014  PCP: No PCP Per Patient  Admit date: 08/14/2014 Discharge date: 08/16/2014  Time spent: 45 minutes  Recommendations for Outpatient Follow-up:  Patient will be discharged home. Patiently to follow-up with her primary care physician within one week of discharge. Patient will need to also follow-up with OB/GYN as she is pregnant. Patient to continue her medications as prescribed. Patient was counseled on the avoidance of nicotine and alcohol products. Patient may continue physical activity as tolerated. Patient should resume a car modified diet given her history of gestational diabetes.  Discharge Diagnoses:  Sepsis urinary to community acquired pneumonia Pleuritic chest pain likely secondary to pneumonia Microcytic hypochromic anemia Tobacco abuse Gestational diabetes mellitus Pregnancy  Discharge Condition: Stable  Diet recommendation: Carb modified  Filed Weights   08/14/14 1712 08/14/14 1736 08/14/14 2008  Weight: 100.699 kg (222 lb) 100.699 kg (222 lb) 106.8 kg (235 lb 7.2 oz)    History of present illness:  on 08/14/2014 33 year old female history of gestational diabetes presented to the emergency department with complaints of persistent chest pain and shortness of breath which developed Wednesday prior to admission. Patient stated she woke up at midnight from sleep due to chest pain which was retrosternal, radiating to both shoulders and present at rest with sharp, pressure-like in nature, associated with shortness of breath. Patient went back to sleep, later woke up drenched in sweat. Patient decided to come to the emergency department where she was found to be febrile and had significant leukocytosis. Chest x-ray showed left upper lobe density. Patient was also tachycardic as well as tachypneic. Blood cultures were obtained, patient was started on empiric bionics for pneumonia.  Patient was also found to be pregnant. CT angiogram to rule out PE was planned by ER physician however canceled due to her pregnancy. Patient denied any recent travel or sick contacts. She does admit to cigarette smoking. She denies any leg swelling, nausea, vomiting, abdominal pain, diarrhea upon admission.  Hospital Course:  Sepsis secondary to community-acquired pneumonia -Patient intially placed on ceftriaxone and azithromycin -Urine Legionella and strep antigens neagtive -HIV nonreactive, Influenza negative -Leukocytosis improving -Continue IV hydration -Will discharge patient on Augmentin as well as azithromycin.  Pleuritic Chest pain likely secondary to pneumonia -Resolved -troponins cycled and negative -Chest CT negative for PE  Microcytic hypochromic anemia -Anemia panel: iron 10, ferritin 99 -Hb remains stable  Tobacco abuse -Smoking cessation counseling given  Gestational diabetes mellitus -on no medications -Should continue to follow up with her ob/gyn and PCP at discharge -Continue to monitor closely  Pregnancy -Will need to follow up with ob/gyn at discharge  Procedures: None  Consultations: Pulmonology  Discharge Exam: Filed Vitals:   08/16/14 0537  BP: 119/68  Pulse: 86  Temp: 98.2 F (36.8 C)  Resp: 20     General: Well developed, well nourished, NAD, appears stated age  HEENT: NCAT, mucous membranes moist.  Cardiovascular: S1 S2 auscultated, no rubs, murmurs or gallops. Regular rate and rhythm.  Respiratory: Clear to auscultation bilaterally with equal chest rise  Abdomen: Soft, obese, nontender, nondistended, + bowel sounds  Extremities: warm dry without cyanosis clubbing or edema  Neuro: AAOx3, no focal deficits  Psych: Appropriate mood and affect  Discharge Instructions      Discharge Instructions    Discharge instructions    Complete by:  As directed   Patient will be discharged home. Patiently to follow-up with her primary  care physician within  one week of discharge. Patient will need to also follow-up with OB/GYN as she is pregnant. Patient to continue her medications as prescribed. Patient was counseled on the avoidance of nicotine and alcohol products. Patient may continue physical activity as tolerated. Patient should resume a car modified diet given her history of gestational diabetes.            Medication List    TAKE these medications        amoxicillin-clavulanate 875-125 MG per tablet  Commonly known as:  AUGMENTIN  Take 1 tablet by mouth 2 (two) times daily.     azithromycin 250 MG tablet  Commonly known as:  ZITHROMAX  Take 1 tablet (250 mg total) by mouth daily.     fluticasone 50 MCG/ACT nasal spray  Commonly known as:  FLONASE  Place 2 sprays into both nostrils daily.       Allergies  Allergen Reactions  . Other Anaphylaxis    Crawfish   . Peanut-Containing Drug Products Itching  . Penicillins Itching  . Strawberry Hives, Itching and Rash   Follow-up Information    Follow up with Primary care physician/OB/GYN. Schedule an appointment as soon as possible for a visit in 1 week.   Why:  Hospital follow-up       The results of significant diagnostics from this hospitalization (including imaging, microbiology, ancillary and laboratory) are listed below for reference.    Significant Diagnostic Studies: Dg Chest 2 View (if Patient Has Fever And/or Copd)  08/14/2014   CLINICAL DATA:  Short of breath, left-sided chest pain  EXAM: CHEST  2 VIEW  COMPARISON:  Radiograph 04/2007  FINDINGS: Normal cardiac silhouette. There is a density in the left upper lobe measuring 5 cm. No pleural fluid or pneumothorax. No aggressive osseous lesion.  IMPRESSION: Left upper low density is most consistent with pneumonia in this age group. Recommend followup radiographs to ensure resolution and exclude underlying mass.   Electronically Signed   By: Suzy Bouchard M.D.   On: 08/14/2014 16:16   Ct Angio  Chest Pe W/cm &/or Wo Cm  08/14/2014   CLINICAL DATA:  Left-sided chest pain with known left upper lobe infiltrate  EXAM: CT ANGIOGRAPHY CHEST WITH CONTRAST  TECHNIQUE: Multidetector CT imaging of the chest was performed using the standard protocol during bolus administration of intravenous contrast. Multiplanar CT image reconstructions and MIPs were obtained to evaluate the vascular anatomy.  CONTRAST:  100 mL Omnipaque 350.  COMPARISON:  None.  FINDINGS: The right lung is well aerated and demonstrates no focal infiltrate. Significant left upper lobe and lingular infiltrate is noted corresponding to that seen on recent plain film examination. This is consistent with the patient's given clinical history of left-sided chest pain.  The thoracic inlet shows the thoracic aorta to be within normal limits. The contrast bolus is significantly limited due to the patient's body habitus. No large central pulmonary embolus is noted. Repeat imaging was not performed due to the patient's underlying pregnant state. No hilar or mediastinal adenopathy is seen. The abdomen is within normal limits as visualized. No acute bony abnormality is noted.  Review of the MIP images confirms the above findings.  IMPRESSION: Large left upper lobe and lingular infiltrate consistent with pneumonia.  No gross pulmonary emboli are identified although the imaging technique is somewhat limited secondary to patient's body habitus. Repeat imaging was not pursued due to the patient's current pregnant state as well as the large pneumonia and likely being the etiology of  the patient's underlying abnormality.   Electronically Signed   By: Inez Catalina M.D.   On: 08/14/2014 23:12    Microbiology: Recent Results (from the past 240 hour(s))  Blood Culture (routine x 2)     Status: None (Preliminary result)   Collection Time: 08/14/14  5:00 PM  Result Value Ref Range Status   Specimen Description BLOOD LEFT HAND  Final   Special Requests BOTTLES  DRAWN AEROBIC AND ANAEROBIC 4ML  Final   Culture  Setup Time   Final    08/14/2014 22:24 Performed at [facility]    Culture   Final           BLOOD CULTURE RECEIVED NO GROWTH TO DATE CULTURE WILL BE HELD FOR 5 DAYS BEFORE ISSUING A FINAL NEGATIVE REPORT Performed at [facility]    Report Status PENDING  Incomplete  Blood Culture (routine x 2)     Status: None (Preliminary result)   Collection Time: 08/14/14  5:00 PM  Result Value Ref Range Status   Specimen Description BLOOD RIGHT HAND  Final   Special Requests BOTTLES DRAWN AEROBIC AND ANAEROBIC 5ML  Final   Culture  Setup Time   Final    08/14/2014 22:24 Performed at [facility]    Culture   Final           BLOOD CULTURE RECEIVED NO GROWTH TO DATE CULTURE WILL BE HELD FOR 5 DAYS BEFORE ISSUING A FINAL NEGATIVE REPORT Performed at [facility]    Report Status PENDING  Incomplete  Urine culture     Status: None   Collection Time: 08/14/14  5:32 PM  Result Value Ref Range Status   Specimen Description URINE, CLEAN CATCH  Final   Special Requests NONE  Final   Culture  Setup Time   Final    08/14/2014 22:48 Performed at [place]   Final    50,000 COLONIES/ML Performed at [facility]    Culture   Final    Multiple bacterial morphotypes present, none predominant. Suggest appropriate recollection if clinically indicated. Performed at [facility]    Report Status 08/15/2014 FINAL  Final  Culture, expectorated sputum-assessment     Status: None   Collection Time: 08/14/14 11:09 PM  Result Value Ref Range Status   Specimen Description SPUTUM  Final   Special Requests NONE  Final   Sputum evaluation   Final    THIS SPECIMEN IS ACCEPTABLE. RESPIRATORY CULTURE REPORT TO FOLLOW.   Report Status 08/14/2014 FINAL  Final  Culture, respiratory (NON-Expectorated)     Status: None (Preliminary result)   Collection Time: 08/14/14 11:09 PM  Result Value  Ref Range Status   Specimen Description SPU  Final   Special Requests NONE  Final   Gram Stain   Final    FEW WBC PRESENT, PREDOMINANTLY PMN RARE SQUAMOUS EPITHELIAL CELLS PRESENT NO ORGANISMS SEEN Performed at [facility]    Culture PENDING  Incomplete   Report Status PENDING  Incomplete     Labs: Basic Metabolic Panel:  Recent Labs Lab 08/14/14 1532 08/14/14 2256 08/15/14 0342 08/16/14 0528  NA 135*  --  136* 141  K 4.0  --  3.6* 4.3  CL 98  --  101 109  CO2 22  --  21 23  GLUCOSE 131*  --  117* 101*  BUN 9  --  8 10  CREATININE 0.93 0.94 0.79 0.75  CALCIUM 9.4  --  8.5 8.5   Liver Function Tests:  Recent Labs Lab 08/14/14 2256 08/15/14 0342  AST 13 11  ALT 11 9  ALKPHOS 98 103  BILITOT 0.5 0.5  PROT 6.6 6.3  ALBUMIN 2.6* 2.5*   No results for input(s): LIPASE, AMYLASE in the last 168 hours. No results for input(s): AMMONIA in the last 168 hours. CBC:  Recent Labs Lab 08/14/14 1532 08/14/14 2256 08/15/14 0342 08/16/14 0528  WBC 39.0* 45.4* 35.4* 17.6*  NEUTROABS  --   --  30.4*  --   HGB 9.8* 8.8* 8.6* 8.7*  HCT 31.2* 27.9* 27.3* 28.2*  MCV 74.5* 75.8* 74.0* 76.8*  PLT 562* 433* 464* 497*   Cardiac Enzymes:  Recent Labs Lab 08/14/14 2256 08/15/14 0342 08/15/14 0930  TROPONINI <0.30 <0.30 <0.30   BNP: BNP (last 3 results)  Recent Labs  08/14/14 2130  PROBNP 149.9*   CBG: No results for input(s): GLUCAP in the last 168 hours.     SignedCristal Ford  [facility] 08/16/2014, 9:24 AM

## 2014-08-16 NOTE — Plan of Care (Signed)
Problem: Phase I Progression Outcomes Goal: OOB as tolerated unless otherwise ordered Outcome: Completed/Met Date Met:  08/16/14     

## 2014-08-16 NOTE — Plan of Care (Signed)
Problem: Phase I Progression Outcomes Goal: Pain controlled with appropriate interventions Outcome: Completed/Met Date Met:  08/16/14     

## 2014-08-18 LAB — CULTURE, RESPIRATORY W GRAM STAIN

## 2014-08-18 LAB — CULTURE, RESPIRATORY

## 2014-08-20 LAB — CULTURE, BLOOD (ROUTINE X 2)
Culture: NO GROWTH
Culture: NO GROWTH

## 2014-08-26 ENCOUNTER — Encounter (HOSPITAL_COMMUNITY): Payer: Self-pay | Admitting: Advanced Practice Midwife

## 2014-08-26 ENCOUNTER — Inpatient Hospital Stay (HOSPITAL_COMMUNITY)
Admission: AD | Admit: 2014-08-26 | Discharge: 2014-08-26 | Disposition: A | Discharge status: Home / Self Care | Payer: Medicaid Other | Source: Ambulatory Visit | Attending: Obstetrics and Gynecology | Admitting: Obstetrics and Gynecology

## 2014-08-26 ENCOUNTER — Inpatient Hospital Stay (HOSPITAL_COMMUNITY): Payer: Medicaid Other

## 2014-08-26 DIAGNOSIS — F17210 Nicotine dependence, cigarettes, uncomplicated: Secondary | ICD-10-CM | POA: Diagnosis not present

## 2014-08-26 DIAGNOSIS — R109 Unspecified abdominal pain: Secondary | ICD-10-CM

## 2014-08-26 DIAGNOSIS — O99331 Smoking (tobacco) complicating pregnancy, first trimester: Secondary | ICD-10-CM | POA: Insufficient documentation

## 2014-08-26 DIAGNOSIS — R1032 Left lower quadrant pain: Secondary | ICD-10-CM | POA: Diagnosis present

## 2014-08-26 DIAGNOSIS — Z3A01 Less than 8 weeks gestation of pregnancy: Secondary | ICD-10-CM | POA: Diagnosis not present

## 2014-08-26 DIAGNOSIS — O26899 Other specified pregnancy related conditions, unspecified trimester: Secondary | ICD-10-CM

## 2014-08-26 DIAGNOSIS — O9989 Other specified diseases and conditions complicating pregnancy, childbirth and the puerperium: Secondary | ICD-10-CM | POA: Diagnosis not present

## 2014-08-26 HISTORY — DX: Pneumonia, unspecified organism: J18.9

## 2014-08-26 HISTORY — DX: Headache, unspecified: R51.9

## 2014-08-26 HISTORY — DX: Headache: R51

## 2014-08-26 LAB — URINALYSIS, ROUTINE W REFLEX MICROSCOPIC
Bilirubin Urine: NEGATIVE
GLUCOSE, UA: NEGATIVE mg/dL
Ketones, ur: NEGATIVE mg/dL
Leukocytes, UA: NEGATIVE
Nitrite: NEGATIVE
Protein, ur: NEGATIVE mg/dL
SPECIFIC GRAVITY, URINE: 1.025 (ref 1.005–1.030)
UROBILINOGEN UA: 0.2 mg/dL (ref 0.0–1.0)
pH: 6.5 (ref 5.0–8.0)

## 2014-08-26 LAB — CBC
HCT: 31.5 % — ABNORMAL LOW (ref 36.0–46.0)
Hemoglobin: 9.8 g/dL — ABNORMAL LOW (ref 12.0–15.0)
MCH: 23.9 pg — ABNORMAL LOW (ref 26.0–34.0)
MCHC: 31.1 g/dL (ref 30.0–36.0)
MCV: 76.8 fL — ABNORMAL LOW (ref 78.0–100.0)
PLATELETS: 524 10*3/uL — AB (ref 150–400)
RBC: 4.1 MIL/uL (ref 3.87–5.11)
RDW: 17.2 % — ABNORMAL HIGH (ref 11.5–15.5)
WBC: 9.3 10*3/uL (ref 4.0–10.5)

## 2014-08-26 LAB — WET PREP, GENITAL
Clue Cells Wet Prep HPF POC: NONE SEEN
Trich, Wet Prep: NONE SEEN
Yeast Wet Prep HPF POC: NONE SEEN

## 2014-08-26 LAB — URINE MICROSCOPIC-ADD ON

## 2014-08-26 LAB — OB RESULTS CONSOLE GC/CHLAMYDIA
Chlamydia: NEGATIVE
Gonorrhea: NEGATIVE

## 2014-08-26 LAB — HCG, QUANTITATIVE, PREGNANCY: hCG, Beta Chain, Quant, S: 6689 m[IU]/mL — ABNORMAL HIGH (ref <5)

## 2014-08-26 NOTE — Discharge Instructions (Signed)
Abdominal Pain During Pregnancy Abdominal pain is common in pregnancy. Most of the time, it does not cause harm. There are many causes of abdominal pain. Some causes are more serious than others. Some of the causes of abdominal pain in pregnancy are easily diagnosed. Occasionally, the diagnosis takes time to understand. Other times, the cause is not determined. Abdominal pain can be a sign that something is very wrong with the pregnancy, or the pain may have nothing to do with the pregnancy at all. For this reason, always tell your health care provider if you have any abdominal discomfort. HOME CARE INSTRUCTIONS  Monitor your abdominal pain for any changes. The following actions may help to alleviate any discomfort you are experiencing:  Do not have sexual intercourse or put anything in your vagina until your symptoms go away completely.  Get plenty of rest until your pain improves.  Drink clear fluids if you feel nauseous. Avoid solid food as long as you are uncomfortable or nauseous.  Only take over-the-counter or prescription medicine as directed by your health care provider.  Keep all follow-up appointments with your health care provider. SEEK IMMEDIATE MEDICAL CARE IF:  You are bleeding, leaking fluid, or passing tissue from the vagina.  You have increasing pain or cramping.  You have persistent vomiting.  You have painful or bloody urination.  You have a fever.  You notice a decrease in your baby's movements.  You have extreme weakness or feel faint.  You have shortness of breath, with or without abdominal pain.  You develop a severe headache with abdominal pain.  You have abnormal vaginal discharge with abdominal pain.  You have persistent diarrhea.  You have abdominal pain that continues even after rest, or gets worse. MAKE SURE YOU:   Understand these instructions.  Will watch your condition.  Will get help right away if you are not doing well or get  worse. Document Released: 09/12/2005 Document Revised: 07/03/2013 Document Reviewed: 04/11/2013 [facility] Patient Information 2015 [place], [place]. This information is not intended to replace advice given to you by your health care provider. Make sure you discuss any questions you have with your health care provider.  Abdominal Pain During Pregnancy Abdominal pain is common in pregnancy. Most of the time, it does not cause harm. There are many causes of abdominal pain. Some causes are more serious than others. Some of the causes of abdominal pain in pregnancy are easily diagnosed. Occasionally, the diagnosis takes time to understand. Other times, the cause is not determined. Abdominal pain can be a sign that something is very wrong with the pregnancy, or the pain may have nothing to do with the pregnancy at all. For this reason, always tell your health care provider if you have any abdominal discomfort. HOME CARE INSTRUCTIONS  Monitor your abdominal pain for any changes. The following actions may help to alleviate any discomfort you are experiencing:  Do not have sexual intercourse or put anything in your vagina until your symptoms go away completely.  Get plenty of rest until your pain improves.  Drink clear fluids if you feel nauseous. Avoid solid food as long as you are uncomfortable or nauseous.  Only take over-the-counter or prescription medicine as directed by your health care provider.  Keep all follow-up appointments with your health care provider. SEEK IMMEDIATE MEDICAL CARE IF:  You are bleeding, leaking fluid, or passing tissue from the vagina.  You have increasing pain or cramping.  You have persistent vomiting.  You have painful  or bloody urination.  You have a fever.  You notice a decrease in your baby's movements.  You have extreme weakness or feel faint.  You have shortness of breath, with or without abdominal pain.  You develop a severe headache with abdominal  pain.  You have abnormal vaginal discharge with abdominal pain.  You have persistent diarrhea.  You have abdominal pain that continues even after rest, or gets worse. MAKE SURE YOU:   Understand these instructions.  Will watch your condition.  Will get help right away if you are not doing well or get worse. Document Released: 09/12/2005 Document Revised: 07/03/2013 Document Reviewed: 04/11/2013 [facility] Patient Information 2015 [place], [place]. This information is not intended to replace advice given to you by your health care provider. Make sure you discuss any questions you have with your health care provider.

## 2014-08-26 NOTE — MAU Note (Signed)
Patient states she was diagnosed with pneumonia on 11-19 at [facility]. Has taken all her antibiotics. States she continues to have pain under the left breast that is the same at the time of diagnosis. States she has some lower abdominal pain. Denies bleeding today, slight discharge, nausea or vomiting. Has spotting yesterday, not today.

## 2014-08-26 NOTE — MAU Provider Note (Signed)
Chief Complaint: No chief complaint on file. Pt is G5P2020 @[redacted]w[redacted]d  who presents with LLQ pain that comes and goes . Pt's LMP 07/14/2014 Pt had spotting yesterday.  Pt last had IC 2 days ago without pain or spotting. Pt denies nausea or vomiting.  Pt denies UTI sx.  Pt had diarrhea when she was taking antibiotics last week - pt was admitted to the [facility] 11/19- 11/21 with pneumonia. Pt with partner for 6 years.   SUBJECTIVE  Katherine Wendelyn Breslow, RN Registered Nurse Signed  MAU Note 08/26/2014 1:04 PM    Expand All Collapse All   C/o period-type cramps for past for 3 weeks; still has pneumonia symptoms- has been on antibiotics but rx is finished; still having the original pain underneath L breast and still having episodes of SOB;        Past Medical History  Diagnosis Date  . Stress   . Panic attacks   . Diabetes mellitus without complication     pt states was gestational   OB History  Gravida Para Term Preterm AB SAB TAB Ectopic Multiple Living  1             # Outcome Date GA Lbr Len/2nd Weight Sex Delivery Anes PTL Lv  1 Current              Past Surgical History  Procedure Laterality Date  . Induced abortion     History   Social History  . Marital Status: Single    Spouse Name: N/A    Number of Children: N/A  . Years of Education: N/A   Occupational History  . Not on file.   Social History Main Topics  . Smoking status: Current Every Day Smoker -- 0.25 packs/day for 18 years    Types: Cigarettes  . Smokeless tobacco: Never Used  . Alcohol Use: No  . Drug Use: No  . Sexual Activity: Yes    Birth Control/ Protection: None   Other Topics Concern  . Not on file   Social History Narrative   No current facility-administered medications on file prior to encounter.   Current Outpatient Prescriptions on File Prior to Encounter  Medication Sig Dispense Refill  . amoxicillin-clavulanate (AUGMENTIN) 875-125 MG per tablet Take 1 tablet by mouth 2 (two) times  daily. 10 tablet 0  . azithromycin (ZITHROMAX) 250 MG tablet Take 1 tablet (250 mg total) by mouth daily. 5 each 0  . fluticasone (FLONASE) 50 MCG/ACT nasal spray Place 2 sprays into both nostrils daily. 16 g 0   Allergies  Allergen Reactions  . Other Anaphylaxis    Crawfish   . Peanut-Containing Drug Products Itching  . Penicillins Itching  . Strawberry Hives, Itching and Rash    ROS: Pertinent items in HPI  OBJECTIVE Last menstrual period 07/14/2014. GENERAL: Well-developed, well-nourished female in no acute distress.  HEENT: Normocephalic HEART: normal rate RESP: normal effort ABDOMEN: Soft, non-tender EXTREMITIES: Nontender, no edema NEURO: Alert and oriented SPECULUM EXAM: NEFG, physiologic discharge, no blood noted, cervix clean BIMANUAL: cervix closed, clean, diffuse mild tenderness; uterus normal size, no adnexal tenderness or masses  LAB RESULTS No results found for this or any previous visit (from the past 24 hour(s)). Results for orders placed or performed during the hospital encounter of 08/26/14 (from the past 24 hour(s))  Urinalysis, Routine w reflex microscopic     Status: Abnormal   Collection Time: 08/26/14 12:39 PM  Result Value Ref Range   Color, Urine YELLOW YELLOW  APPearance CLOUDY (A) CLEAR   Specific Gravity, Urine 1.025 1.005 - 1.030   pH 6.5 5.0 - 8.0   Glucose, UA NEGATIVE NEGATIVE mg/dL   Hgb urine dipstick MODERATE (A) NEGATIVE   Bilirubin Urine NEGATIVE NEGATIVE   Ketones, ur NEGATIVE NEGATIVE mg/dL   Protein, ur NEGATIVE NEGATIVE mg/dL   Urobilinogen, UA 0.2 0.0 - 1.0 mg/dL   Nitrite NEGATIVE NEGATIVE   Leukocytes, UA NEGATIVE NEGATIVE  Urine microscopic-add on     Status: Abnormal   Collection Time: 08/26/14 12:39 PM  Result Value Ref Range   Squamous Epithelial / LPF MANY (A) RARE   WBC, UA 0-2 <3 WBC/hpf   RBC / HPF 0-2 <3 RBC/hpf   Bacteria, UA RARE RARE  hCG, quantitative, pregnancy     Status: Abnormal   Collection Time:  08/26/14 12:55 PM  Result Value Ref Range   hCG, Beta Chain, Quant, S 6689 (H) <5 mIU/mL  CBC     Status: Abnormal   Collection Time: 08/26/14 12:55 PM  Result Value Ref Range   WBC 9.3 4.0 - 10.5 K/uL   RBC 4.10 3.87 - 5.11 MIL/uL   Hemoglobin 9.8 (L) 12.0 - 15.0 g/dL   HCT 31.5 (L) 36.0 - 46.0 %   MCV 76.8 (L) 78.0 - 100.0 fL   MCH 23.9 (L) 26.0 - 34.0 pg   MCHC 31.1 30.0 - 36.0 g/dL   RDW 17.2 (H) 11.5 - 15.5 %   Platelets 524 (H) 150 - 400 K/uL  Wet prep, genital     Status: Abnormal   Collection Time: 08/26/14  1:55 PM  Result Value Ref Range   Yeast Wet Prep HPF POC NONE SEEN NONE SEEN   Trich, Wet Prep NONE SEEN NONE SEEN   Clue Cells Wet Prep HPF POC NONE SEEN NONE SEEN   WBC, Wet Prep HPF POC FEW (A) NONE SEEN   IMAGING Dg Chest 2 View (if Patient Has Fever And/or Copd)  08/14/2014   CLINICAL DATA:  Short of breath, left-sided chest pain  EXAM: CHEST  2 VIEW  COMPARISON:  Radiograph 04/2007  FINDINGS: Normal cardiac silhouette. There is a density in the left upper lobe measuring 5 cm. No pleural fluid or pneumothorax. No aggressive osseous lesion.  IMPRESSION: Left upper low density is most consistent with pneumonia in this age group. Recommend followup radiographs to ensure resolution and exclude underlying mass.   Electronically Signed   By: Suzy Bouchard M.D.   On: 08/14/2014 16:16   Ct Angio Chest Pe W/cm &/or Wo Cm  08/14/2014   CLINICAL DATA:  Left-sided chest pain with known left upper lobe infiltrate  EXAM: CT ANGIOGRAPHY CHEST WITH CONTRAST  TECHNIQUE: Multidetector CT imaging of the chest was performed using the standard protocol during bolus administration of intravenous contrast. Multiplanar CT image reconstructions and MIPs were obtained to evaluate the vascular anatomy.  CONTRAST:  100 mL Omnipaque 350.  COMPARISON:  None.  FINDINGS: The right lung is well aerated and demonstrates no focal infiltrate. Significant left upper lobe and lingular infiltrate is noted  corresponding to that seen on recent plain film examination. This is consistent with the patient's given clinical history of left-sided chest pain.  The thoracic inlet shows the thoracic aorta to be within normal limits. The contrast bolus is significantly limited due to the patient's body habitus. No large central pulmonary embolus is noted. Repeat imaging was not performed due to the patient's underlying pregnant state. No hilar or mediastinal adenopathy  is seen. The abdomen is within normal limits as visualized. No acute bony abnormality is noted.  Review of the MIP images confirms the above findings.  IMPRESSION: Large left upper lobe and lingular infiltrate consistent with pneumonia.  No gross pulmonary emboli are identified although the imaging technique is somewhat limited secondary to patient's body habitus. Repeat imaging was not pursued due to the patient's current pregnant state as well as the large pneumonia and likely being the etiology of the patient's underlying abnormality.   Electronically Signed   By: Inez Catalina M.D.   On: 08/14/2014 23:12  US Ob Comp Less 14 Wks  08/26/2014   CLINICAL DATA:  Abdominal pain affecting pregnancy. Vaginal bleeding. Cramping. Gestational age by LMP of 6 weeks 1 day.  EXAM: OBSTETRIC <14 WK [place] AND TRANSVAGINAL OB US  TECHNIQUE: Both transabdominal and transvaginal ultrasound examinations were performed for complete evaluation of the gestation as well as the maternal uterus, adnexal regions, and pelvic cul-de-sac. Transvaginal technique was performed to assess early pregnancy.  COMPARISON:  None.  FINDINGS: Intrauterine gestational sac: Visualized/normal in shape.  Yolk sac:  Visualized  Embryo:  Not visualized  MSD:  11  mm   5 w   6  d  Maternal uterus/adnexae: At least 3 separate uterine fibroids are seen which measure 2.3 cm, 2.5 cm, and 4.1 cm in maximum diameter. Small amount of subchorionic hemorrhage noted.  Both ovaries are normal in appearance. No adnexal  mass or free fluid identified.  IMPRESSION: Single intrauterine gestational sac with estimated gestational age of [redacted] weeks 6 days by mean sac diameter. Recommend continued followup of quantitative B-HCG levels, with follow-up US no earlier than 7-10 days to assess viability if warranted clinically.  Small subchorionic hemorrhage.  Several uterine fibroids, largest measuring 4.1 cm.   Electronically Signed   By: Earle Gell M.D.   On: 08/26/2014 15:07   US Ob Transvaginal  08/26/2014   CLINICAL DATA:  Abdominal pain affecting pregnancy. Vaginal bleeding. Cramping. Gestational age by LMP of 6 weeks 1 day.  EXAM: OBSTETRIC <14 WK [place] AND TRANSVAGINAL OB US  TECHNIQUE: Both transabdominal and transvaginal ultrasound examinations were performed for complete evaluation of the gestation as well as the maternal uterus, adnexal regions, and pelvic cul-de-sac. Transvaginal technique was performed to assess early pregnancy.  COMPARISON:  None.  FINDINGS: Intrauterine gestational sac: Visualized/normal in shape.  Yolk sac:  Visualized  Embryo:  Not visualized  MSD:  11  mm   5 w   6  d  Maternal uterus/adnexae: At least 3 separate uterine fibroids are seen which measure 2.3 cm, 2.5 cm, and 4.1 cm in maximum diameter. Small amount of subchorionic hemorrhage noted.  Both ovaries are normal in appearance. No adnexal mass or free fluid identified.  IMPRESSION: Single intrauterine gestational sac with estimated gestational age of [redacted] weeks 6 days by mean sac diameter. Recommend continued followup of quantitative B-HCG levels, with follow-up US no earlier than 7-10 days to assess viability if warranted clinically.  Small subchorionic hemorrhage.  Several uterine fibroids, largest measuring 4.1 cm.   Electronically Signed   By: Earle Gell M.D.   On: 08/26/2014 15:07    MAU COURSE  ASSESSMENT 1. Abdominal pain affecting pregnancy, antepartum   follow  Up for ultrasound in 10 days  PLAN Soon[place] if increase in pain or  bleeding    Medication List    ASK your doctor about these medications  amoxicillin-clavulanate 875-125 MG per tablet  Commonly known as:  AUGMENTIN  Take 1 tablet by mouth 2 (two) times daily.     azithromycin 250 MG tablet  Commonly known as:  ZITHROMAX  Take 1 tablet (250 mg total) by mouth daily.     fluticasone 50 MCG/ACT nasal spray  Commonly known as:  FLONASE  Place 2 sprays into both nostrils daily.        Sherolyn Buba, [place], [place] 08/26/2014  12:28 PM

## 2014-08-26 NOTE — MAU Note (Signed)
C/o period-type cramps for past for 3 weeks; still has pneumonia symptoms- has been on antibiotics but rx is finished; still having the original pain underneath L breast and still having episodes of SOB;

## 2014-08-27 LAB — GC/CHLAMYDIA PROBE AMP
CT PROBE, AMP APTIMA: NEGATIVE
GC Probe RNA: NEGATIVE

## 2014-09-11 ENCOUNTER — Ambulatory Visit (HOSPITAL_COMMUNITY)
Admission: RE | Admit: 2014-09-11 | Discharge: 2014-09-11 | Disposition: A | Discharge status: Home / Self Care | Payer: Medicaid Other | Source: Ambulatory Visit | Attending: Gynecology | Admitting: Gynecology

## 2014-09-11 ENCOUNTER — Telehealth: Payer: Self-pay | Admitting: Physician Assistant

## 2014-09-11 DIAGNOSIS — O3411 Maternal care for benign tumor of corpus uteri, first trimester: Secondary | ICD-10-CM | POA: Diagnosis not present

## 2014-09-11 DIAGNOSIS — D259 Leiomyoma of uterus, unspecified: Secondary | ICD-10-CM | POA: Diagnosis not present

## 2014-09-11 DIAGNOSIS — Z36 Encounter for antenatal screening of mother: Secondary | ICD-10-CM | POA: Diagnosis present

## 2014-09-11 DIAGNOSIS — Z3A08 8 weeks gestation of pregnancy: Secondary | ICD-10-CM | POA: Diagnosis not present

## 2014-09-11 DIAGNOSIS — O468X1 Other antepartum hemorrhage, first trimester: Secondary | ICD-10-CM | POA: Diagnosis not present

## 2014-09-11 NOTE — Telephone Encounter (Signed)
[name] seen from U/S to review results.   US Ob Transvaginal  09/11/2014   CLINICAL DATA:  Pregnant, follow-up viability  EXAM: TRANSVAGINAL OB ULTRASOUND  TECHNIQUE: Transvaginal ultrasound was performed for complete evaluation of the gestation as well as the maternal uterus, adnexal regions, and pelvic cul-de-sac.  COMPARISON:  08/26/2014  FINDINGS: Intrauterine gestational sac: Visualized/normal in shape.  Yolk sac:  Present  Embryo:  Present  Cardiac Activity: Present  Heart Rate: 175 bpm  CRL:   17.5  mm   8 w 2 d                  [place] EDC: 04/21/2015  Maternal uterus/adnexae: Small subchorionic hemorrhage.  Multiple fibroids, measuring up to 4.0 x 3.4 x 3.8 cm.  Bilateral ovaries are not discretely visualized.  No free fluid.  IMPRESSION: Single live intrauterine gestation with estimated gestational age [redacted] weeks 2 days by crown-rump length.   Electronically Signed   By: Julian Hy M.D.   On: 09/11/2014 14:01   Pt receives this news well.  She already has appt for [facility] and is advised to keep appt.  Return to MAU for any emergency.

## 2014-09-26 NOTE — L&D Delivery Note (Cosign Needed)
Delivery Note This is a 34 year old G 5 P3 who was admitted for Not in labor. and IOL. She progressed normally with cytotec and epidural to the second stage of labor.  She pushed for 10 min. At 8:56 AM a viable female was delivered via Vaginal, Spontaneous Delivery (Presentation: Left Occiput Anterior).  APGAR: 9, 9; weight 6 lb 11 oz (3033 g).  She delivered a viable infant female, cephalic.  A nuchal cord  Identified was not. Infant was placed on maternal abdomen.  Delayed cord clamping was done for 5-7 minutes.  Cord double clamed and cut.   Apgar scores were 9 and 9. The placenta delivered spontaneously, shultz, with a 3 vessel cord.  Inspection revealed 2nd degree. The uterus was firm bleeding stable.  The repair was done under epidural.   EBL was 112.    Placenta  was sent.  There were no complications during the procedure.  Mom and baby skin to skin following delivery. Left in stable condition.  Placenta status: Intact, Spontaneous.  Cord: 3 vessels with no complications.      Anesthesia: Epidural  Episiotomy: None Lacerations: 2nd degree Suture Repair: 3.0 vicryl Est. Blood Loss (mL): 112  Mom to postpartum.  Baby to Couplet care / Skin to Skin.  Deannah Rossi A Mateo Overbeck 04/15/2015, 10:32 AM

## 2014-09-30 ENCOUNTER — Encounter: Payer: Self-pay | Admitting: Obstetrics

## 2014-09-30 ENCOUNTER — Ambulatory Visit (INDEPENDENT_AMBULATORY_CARE_PROVIDER_SITE_OTHER): Payer: Medicaid Other | Admitting: Obstetrics

## 2014-09-30 VITALS — BP 133/84 | HR 77 | Temp 98.2°F | Wt 241.0 lb

## 2014-09-30 DIAGNOSIS — Z3481 Encounter for supervision of other normal pregnancy, first trimester: Secondary | ICD-10-CM

## 2014-09-30 DIAGNOSIS — Z3490 Encounter for supervision of normal pregnancy, unspecified, unspecified trimester: Secondary | ICD-10-CM

## 2014-09-30 LAB — POCT URINALYSIS DIPSTICK
Bilirubin, UA: NEGATIVE
Blood, UA: 50
Glucose, UA: NEGATIVE
Ketones, UA: NEGATIVE
Nitrite, UA: NEGATIVE
Spec Grav, UA: 1.01
Urobilinogen, UA: NEGATIVE
pH, UA: 6.5

## 2014-09-30 NOTE — Progress Notes (Signed)
Subjective:    Katherine Douglas is being seen today for her first obstetrical visit.  This is not a planned pregnancy. She is at [redacted]w[redacted]d gestation. Her obstetrical history is significant for obesity. Relationship with FOB: unknown. Patient does intend to breast feed. Pregnancy history fully reviewed.  The information documented in the HPI was reviewed and verified.  Menstrual History: OB History    Gravida Para Term Preterm AB TAB SAB Ectopic Multiple Living   5 2 2  2 2    2        Patient's last menstrual period was 07/14/2014.    Past Medical History  Diagnosis Date  . Stress   . Panic attacks   . Diabetes mellitus without complication     pt states was gestational  . Pneumonia   . Headache     Past Surgical History  Procedure Laterality Date  . Induced abortion       (Not in a hospital admission) Allergies  Allergen Reactions  . Other Anaphylaxis    Crawfish   . Peanut-Containing Drug Products Itching  . Penicillins Itching  . Strawberry Hives, Itching and Rash    History  Substance Use Topics  . Smoking status: Former Smoker -- 0.25 packs/day for 18 years    Types: Cigarettes  . Smokeless tobacco: Never Used  . Alcohol Use: No    Family History  Problem Relation Age of Onset  . Hypertension Mother   . Heart failure Mother   . Diabetes Father      Review of Systems Constitutional: negative for weight loss Gastrointestinal: negative for vomiting Genitourinary:negative for genital lesions and vaginal discharge and dysuria Musculoskeletal:negative for back pain Behavioral/Psych: negative for abusive relationship, depression, illegal drug usage and tobacco use    Objective:    BP 133/84 mmHg  Pulse 77  Temp(Src) 98.2 F (36.8 C)  Wt 241 lb (109.317 kg)  LMP 07/14/2014 General Appearance:    Alert, cooperative, no distress, appears stated age  Head:    Normocephalic, without obvious abnormality, atraumatic  Eyes:    PERRL, conjunctiva/corneas  clear, EOM's intact, fundi    benign, both eyes  Ears:    Normal TM's and external ear canals, both ears  Nose:   Nares normal, septum midline, mucosa normal, no drainage    or sinus tenderness  Throat:   Lips, mucosa, and tongue normal; teeth and gums normal  Neck:   Supple, symmetrical, trachea midline, no adenopathy;    thyroid:  no enlargement/tenderness/nodules; no carotid   bruit or JVD  Back:     Symmetric, no curvature, ROM normal, no CVA tenderness  Lungs:     Clear to auscultation bilaterally, respirations unlabored  Chest Wall:    No tenderness or deformity   Heart:    Regular rate and rhythm, S1 and S2 normal, no murmur, rub   or gallop  Breast Exam:    No tenderness, masses, or nipple abnormality  Abdomen:     Soft, non-tender, bowel sounds active all four quadrants,    no masses, no organomegaly  Genitalia:    Normal female without lesion, discharge or tenderness  Extremities:   Extremities normal, atraumatic, no cyanosis or edema  Pulses:   2+ and symmetric all extremities  Skin:   Skin color, texture, turgor normal, no rashes or lesions  Lymph nodes:   Cervical, supraclavicular, and axillary nodes normal  Neurologic:   CNII-XII intact, normal strength, sensation and reflexes    throughout  Lab Review Urine pregnancy test Labs reviewed yes Radiologic studies reviewed yes Assessment:    Pregnancy at [redacted]w[redacted]d weeks    Plan:      Prenatal vitamins.  Counseling provided regarding continued use of seat belts, cessation of alcohol consumption, smoking or use of illicit drugs; infection precautions i.e., influenza/TDAP immunizations, toxoplasmosis,CMV, parvovirus, listeria and varicella; workplace safety, exercise during pregnancy; routine dental care, safe medications, sexual activity, hot tubs, saunas, pools, travel, caffeine use, fish and methlymercury, potential toxins, hair treatments, varicose veins Weight gain recommendations per IOM guidelines reviewed:  underweight/BMI< 18.5--> gain 28 - 40 lbs; normal weight/BMI 18.5 - 24.9--> gain 25 - 35 lbs; overweight/BMI 25 - 29.9--> gain 15 - 25 lbs; obese/BMI >30->gain  11 - 20 lbs Problem list reviewed and updated. FIRST/CF mutation testing/NIPT/QUAD SCREEN/fragile X/Ashkenazi Jewish population testing/Spinal muscular atrophy discussed: requested. Role of ultrasound in pregnancy discussed; fetal survey: requested. Amniocentesis discussed: not indicated. VBAC calculator score: VBAC consent form provided No orders of the defined types were placed in this encounter.   Orders Placed This Encounter  Procedures  . Culture, OB Urine  . Obstetric panel  . HIV antibody  . Hemoglobinopathy evaluation  . Varicella zoster antibody, IgG  . Vit D  25 hydroxy (rtn osteoporosis monitoring)  . POCT urinalysis dipstick    Follow up in 4 weeks.

## 2014-10-01 LAB — OBSTETRIC PANEL
Antibody Screen: NEGATIVE
Basophils Absolute: 0 10*3/uL (ref 0.0–0.1)
Basophils Relative: 0 % (ref 0–1)
EOS ABS: 0.1 10*3/uL (ref 0.0–0.7)
EOS PCT: 1 % (ref 0–5)
HCT: 31.4 % — ABNORMAL LOW (ref 36.0–46.0)
HEMOGLOBIN: 9.8 g/dL — AB (ref 12.0–15.0)
Hepatitis B Surface Ag: NEGATIVE
LYMPHS ABS: 2.1 10*3/uL (ref 0.7–4.0)
Lymphocytes Relative: 28 % (ref 12–46)
MCH: 24 pg — AB (ref 26.0–34.0)
MCHC: 31.2 g/dL (ref 30.0–36.0)
MCV: 76.8 fL — AB (ref 78.0–100.0)
MONO ABS: 0.6 10*3/uL (ref 0.1–1.0)
MONOS PCT: 8 % (ref 3–12)
MPV: 8.1 fL — ABNORMAL LOW (ref 8.6–12.4)
NEUTROS PCT: 63 % (ref 43–77)
Neutro Abs: 4.7 10*3/uL (ref 1.7–7.7)
Platelets: 562 10*3/uL — ABNORMAL HIGH (ref 150–400)
RBC: 4.09 MIL/uL (ref 3.87–5.11)
RDW: 17.9 % — ABNORMAL HIGH (ref 11.5–15.5)
RH TYPE: POSITIVE
Rubella: 1 Index — ABNORMAL HIGH (ref <0.90)
WBC: 7.5 10*3/uL (ref 4.0–10.5)

## 2014-10-01 LAB — VARICELLA ZOSTER ANTIBODY, IGG: Varicella IgG: 1962 Index — ABNORMAL HIGH (ref <135.00)

## 2014-10-01 LAB — HIV ANTIBODY (ROUTINE TESTING W REFLEX): HIV: NONREACTIVE

## 2014-10-01 LAB — VITAMIN D 25 HYDROXY (VIT D DEFICIENCY, FRACTURES): VIT D 25 HYDROXY: 11 ng/mL — AB (ref 30–100)

## 2014-10-02 ENCOUNTER — Telehealth: Payer: Self-pay | Admitting: *Deleted

## 2014-10-02 LAB — HEMOGLOBINOPATHY EVALUATION
HGB A2 QUANT: 2.2 % (ref 2.2–3.2)
HGB F QUANT: 0.3 % (ref 0.0–2.0)
Hemoglobin Other: 0 %
Hgb A: 97.5 % (ref 96.8–97.8)
Hgb S Quant: 0 %

## 2014-10-02 NOTE — Telephone Encounter (Signed)
Patient states she was just in the office a couple days ago for her NOB appointment. Patient states she discussed ways to combat nausea and vomiting. She is eating small meals and it is just not helping her appetite for when she gets nausea it is hard to eat or drink.Reviewed OTC supplements and remedies as well as Bcalm PNV. Patient to pick up samples and to try Unisom or Bonine to see if she can calm things down to make it to her 2nd trimester and hopefully feel better.

## 2014-10-03 LAB — CULTURE, OB URINE

## 2014-10-04 ENCOUNTER — Other Ambulatory Visit: Payer: Self-pay | Admitting: Obstetrics

## 2014-10-04 DIAGNOSIS — N390 Urinary tract infection, site not specified: Secondary | ICD-10-CM

## 2014-10-04 MED ORDER — NITROFURANTOIN MONOHYD MACRO 100 MG PO CAPS: 100.0000 mg | ORAL_CAPSULE | Freq: Two times a day (BID) | ORAL | Status: DC

## 2014-10-06 ENCOUNTER — Other Ambulatory Visit: Payer: Self-pay | Admitting: *Deleted

## 2014-10-06 DIAGNOSIS — E559 Vitamin D deficiency, unspecified: Secondary | ICD-10-CM

## 2014-10-06 MED ORDER — OB COMPLETE PETITE 35-5-1-200 MG PO CAPS: 1.0000 | ORAL_CAPSULE | Freq: Every day | ORAL | Status: DC

## 2014-10-23 ENCOUNTER — Telehealth: Payer: Self-pay | Admitting: Obstetrics

## 2014-10-23 NOTE — Telephone Encounter (Signed)
Pt called and stated she is unable to keep anything in her stomach since Monday. It was suggested to pt to go to [facility].  Pt sound good and like she understood.  [place]

## 2014-10-28 ENCOUNTER — Encounter: Payer: Self-pay | Admitting: Obstetrics

## 2014-10-28 ENCOUNTER — Ambulatory Visit (INDEPENDENT_AMBULATORY_CARE_PROVIDER_SITE_OTHER): Payer: Medicaid Other | Admitting: Obstetrics

## 2014-10-28 ENCOUNTER — Other Ambulatory Visit: Payer: Self-pay | Admitting: Obstetrics

## 2014-10-28 VITALS — BP 125/75 | HR 87 | Temp 97.6°F | Wt 240.0 lb

## 2014-10-28 DIAGNOSIS — O219 Vomiting of pregnancy, unspecified: Secondary | ICD-10-CM

## 2014-10-28 DIAGNOSIS — Z36 Encounter for antenatal screening of mother: Secondary | ICD-10-CM

## 2014-10-28 DIAGNOSIS — Z3689 Encounter for other specified antenatal screening: Secondary | ICD-10-CM

## 2014-10-28 DIAGNOSIS — Z3482 Encounter for supervision of other normal pregnancy, second trimester: Secondary | ICD-10-CM

## 2014-10-28 DIAGNOSIS — N390 Urinary tract infection, site not specified: Secondary | ICD-10-CM

## 2014-10-28 LAB — POCT URINALYSIS DIPSTICK
Bilirubin, UA: NEGATIVE
Blood, UA: NEGATIVE
Glucose, UA: NEGATIVE
Ketones, UA: NEGATIVE
NITRITE UA: POSITIVE
PH UA: 7
PROTEIN UA: NEGATIVE
SPEC GRAV UA: 1.015
Urobilinogen, UA: NEGATIVE

## 2014-10-28 MED ORDER — ONDANSETRON HCL 4 MG PO TABS: 4.0000 mg | ORAL_TABLET | Freq: Three times a day (TID) | ORAL | Status: DC | PRN

## 2014-10-28 MED ORDER — NITROFURANTOIN MONOHYD MACRO 100 MG PO CAPS: 100.0000 mg | ORAL_CAPSULE | Freq: Two times a day (BID) | ORAL | Status: DC

## 2014-10-28 NOTE — Progress Notes (Signed)
  Subjective:    Katherine Douglas is a 34 y.o. female being seen today for her obstetrical visit. She is at [redacted]w[redacted]d gestation. Patient reports: no complaints.  Problem List Items Addressed This Visit    None    Visit Diagnoses    Encounter for supervision of other normal pregnancy in second trimester    -  Primary    Relevant Orders    POCT urinalysis dipstick (Completed)    Urine culture    AFP, Quad Screen    Encounter for fetal anatomic survey        Relevant Orders    US OB Comp + 14 Wk    Nausea and vomiting in pregnancy prior to [redacted] weeks gestation        Relevant Medications    ondansetron (ZOFRAN) tablet      Patient Active Problem List   Diagnosis Date Noted  . Pain in the chest   . SOB (shortness of breath)   . Tachycardia   . Chest pain 08/14/2014  . Sepsis 08/14/2014  . Pneumonia 08/14/2014  . Microcytic anemia 08/14/2014  . Tobacco abuse 08/14/2014    Objective:     BP 125/75 mmHg  Pulse 87  Temp(Src) 97.6 F (36.4 C)  Wt 240 lb (108.863 kg)  LMP 07/14/2014 Uterine Size: Below umbilicus     Assessment:    Pregnancy @ [redacted]w[redacted]d  weeks Doing well    Plan:    Problem list reviewed and updated. Labs reviewed.  Follow up in 4 weeks. FIRST/CF mutation testing/NIPT/QUAD SCREEN/fragile X/Ashkenazi Jewish population testing/Spinal muscular atrophy discussed: requested. Role of ultrasound in pregnancy discussed; fetal survey: requested. Amniocentesis discussed: not indicated.

## 2014-10-29 ENCOUNTER — Telehealth: Payer: Self-pay | Admitting: *Deleted

## 2014-10-29 LAB — AFP, QUAD SCREEN
AFP: 17.7 ng/mL
Age Alone: 1:425 {titer}
Curr Gest Age: 15.1 wks.days
HCG, Total: 52.49 IU/mL
INH: 203.7 pg/mL
Interpretation-AFP: NEGATIVE
MoM for AFP: 0.72
MoM for INH: 1.4
MoM for hCG: 1.37
Open Spina bifida: NEGATIVE
Tri 18 Scr Risk Est: NEGATIVE
Trisomy 18 (Edward) Syndrome Interp.: 1:25900 {titer}
UE3 MOM: 1.23
uE3 Value: 0.72 ng/mL

## 2014-10-30 LAB — URINE CULTURE

## 2014-10-31 ENCOUNTER — Other Ambulatory Visit: Payer: Self-pay | Admitting: Obstetrics

## 2014-11-03 ENCOUNTER — Other Ambulatory Visit: Payer: Self-pay | Admitting: *Deleted

## 2014-11-03 ENCOUNTER — Telehealth: Payer: Self-pay | Admitting: *Deleted

## 2014-11-03 DIAGNOSIS — O219 Vomiting of pregnancy, unspecified: Secondary | ICD-10-CM

## 2014-11-03 MED ORDER — PROMETHAZINE HCL 25 MG PO TABS: 25.0000 mg | ORAL_TABLET | Freq: Four times a day (QID) | ORAL | Status: DC | PRN

## 2014-11-03 NOTE — Telephone Encounter (Signed)
Patient states she does not want to use Zofran because of the media reports- she wants something else. Per Dr Jodi Mourning- call in [facility] as alternative.

## 2014-11-04 NOTE — Telephone Encounter (Signed)
Error

## 2014-11-19 ENCOUNTER — Ambulatory Visit (INDEPENDENT_AMBULATORY_CARE_PROVIDER_SITE_OTHER): Payer: Medicaid Other

## 2014-11-19 DIAGNOSIS — Z3689 Encounter for other specified antenatal screening: Secondary | ICD-10-CM

## 2014-11-19 DIAGNOSIS — Z36 Encounter for antenatal screening of mother: Secondary | ICD-10-CM

## 2014-11-19 LAB — US OB COMP + 14 WK

## 2014-11-25 ENCOUNTER — Ambulatory Visit (INDEPENDENT_AMBULATORY_CARE_PROVIDER_SITE_OTHER): Payer: Medicaid Other | Admitting: Obstetrics

## 2014-11-25 VITALS — BP 126/80 | HR 102 | Wt 241.0 lb

## 2014-11-25 DIAGNOSIS — O219 Vomiting of pregnancy, unspecified: Secondary | ICD-10-CM

## 2014-11-25 DIAGNOSIS — Z3482 Encounter for supervision of other normal pregnancy, second trimester: Secondary | ICD-10-CM

## 2014-11-25 LAB — POCT URINALYSIS DIPSTICK
Bilirubin, UA: NEGATIVE
Blood, UA: NEGATIVE
Glucose, UA: NEGATIVE
NITRITE UA: NEGATIVE
PH UA: 6
SPEC GRAV UA: 1.015
UROBILINOGEN UA: NEGATIVE

## 2014-11-25 MED ORDER — ONDANSETRON HCL 4 MG PO TABS: 4.0000 mg | ORAL_TABLET | Freq: Three times a day (TID) | ORAL | Status: AC | PRN

## 2014-11-25 NOTE — Progress Notes (Signed)
Subjective:    Katherine Douglas is a 34 y.o. female being seen today for her obstetrical visit. She is at [redacted]w[redacted]d gestation. Patient reports: fatigue, nausea, no contractions, no cramping and no leaking . C/O lower left abdominal discomfort, with movement.  Fetal movement: normal.  Problem List Items Addressed This Visit    None    Visit Diagnoses    Encounter for supervision of other normal pregnancy in second trimester    -  Primary    Relevant Orders    POCT urinalysis dipstick (Completed)    Nausea and vomiting during pregnancy prior to [redacted] weeks gestation        Relevant Medications    ondansetron (ZOFRAN) tablet      Patient Active Problem List   Diagnosis Date Noted  . Pain in the chest   . SOB (shortness of breath)   . Tachycardia   . Chest pain 08/14/2014  . Sepsis 08/14/2014  . Pneumonia 08/14/2014  . Microcytic anemia 08/14/2014  . Tobacco abuse 08/14/2014   Objective:    BP 126/80 mmHg  Pulse 102  Wt 109.317 kg (241 lb)  LMP 07/14/2014 FHT: 130 BPM  Uterine Size: Below U, size = dates     Assessment:    Pregnancy @ [redacted]w[redacted]d    Plan:    Signs and symptoms of preterm labor: discussed. Encouraged eating and drinking of fluids Probable LLQ abdominal pain related to pendulous abdomen, consider maternity support belt next visit.   OGTT planned next visit Labs, problem list reviewed and updated Follow up in 4 weeks.

## 2014-12-15 ENCOUNTER — Inpatient Hospital Stay (HOSPITAL_COMMUNITY)
Admission: AD | Admit: 2014-12-15 | Discharge: 2014-12-16 | Disposition: A | Discharge status: Home / Self Care | Payer: Medicaid Other | Source: Ambulatory Visit | Attending: Obstetrics | Admitting: Obstetrics

## 2014-12-15 ENCOUNTER — Inpatient Hospital Stay (HOSPITAL_COMMUNITY): Payer: Medicaid Other

## 2014-12-15 ENCOUNTER — Encounter (HOSPITAL_COMMUNITY): Payer: Self-pay | Admitting: *Deleted

## 2014-12-15 DIAGNOSIS — M940 Chondrocostal junction syndrome [Tietze]: Secondary | ICD-10-CM | POA: Insufficient documentation

## 2014-12-15 DIAGNOSIS — O9989 Other specified diseases and conditions complicating pregnancy, childbirth and the puerperium: Secondary | ICD-10-CM | POA: Insufficient documentation

## 2014-12-15 DIAGNOSIS — M546 Pain in thoracic spine: Secondary | ICD-10-CM | POA: Insufficient documentation

## 2014-12-15 DIAGNOSIS — Z3A22 22 weeks gestation of pregnancy: Secondary | ICD-10-CM | POA: Diagnosis not present

## 2014-12-15 DIAGNOSIS — R05 Cough: Secondary | ICD-10-CM

## 2014-12-15 DIAGNOSIS — Z8701 Personal history of pneumonia (recurrent): Secondary | ICD-10-CM | POA: Insufficient documentation

## 2014-12-15 DIAGNOSIS — Z87891 Personal history of nicotine dependence: Secondary | ICD-10-CM | POA: Insufficient documentation

## 2014-12-15 DIAGNOSIS — R059 Cough, unspecified: Secondary | ICD-10-CM

## 2014-12-15 LAB — URINALYSIS, ROUTINE W REFLEX MICROSCOPIC
Bilirubin Urine: NEGATIVE
Glucose, UA: NEGATIVE mg/dL
Hgb urine dipstick: NEGATIVE
Ketones, ur: 15 mg/dL — AB
Nitrite: NEGATIVE
PROTEIN: NEGATIVE mg/dL
SPECIFIC GRAVITY, URINE: 1.02 (ref 1.005–1.030)
UROBILINOGEN UA: 0.2 mg/dL (ref 0.0–1.0)
pH: 6.5 (ref 5.0–8.0)

## 2014-12-15 LAB — URINE MICROSCOPIC-ADD ON

## 2014-12-15 NOTE — MAU Note (Signed)
Pt reports right flank pain for the last 3 days. States pain is worsening. States the pain is like when she had pneumonia before. States it hurts more when she breathes in . Denies cough or fever.

## 2014-12-15 NOTE — MAU Provider Note (Signed)
History     CSN: [id number]  Arrival date and time: 12/15/14 2121   First Provider Initiated Contact with Patient 12/15/14 2314      No chief complaint on file.  HPI [name] is a 34 y.o. [phone] at [redacted]w[redacted]d who presents to MAU today with complaint of right sided, lateral mid-thoracic back pain. The patient states pain started today. She states recent productive cough x 4 days. She denies fever or SOB. She states a recent history of pneumonia, that felt similar to this. She states occasional mild abdominal cramping, but none now. She denies vaginal bleeding. She reports good fetal movement.   OB History    Gravida Para Term Preterm AB TAB SAB Ectopic Multiple Living   5 2 2  2 2    2       Past Medical History  Diagnosis Date  . Stress   . Panic attacks   . Diabetes mellitus without complication     pt states was gestational  . Pneumonia   . Headache     Past Surgical History  Procedure Laterality Date  . Induced abortion      Family History  Problem Relation Age of Onset  . Hypertension Mother   . Heart failure Mother   . Diabetes Father     History  Substance Use Topics  . Smoking status: Former Smoker -- 0.25 packs/day for 18 years    Types: Cigarettes    Quit date: 10/16/2014  . Smokeless tobacco: Never Used  . Alcohol Use: No    Allergies:  Allergies  Allergen Reactions  . Other Anaphylaxis    Crawfish   . Peanut-Containing Drug Products Itching  . Penicillins Itching  . Strawberry Hives, Itching and Rash    Prescriptions prior to admission  Medication Sig Dispense Refill Last Dose  . promethazine (PHENERGAN) 25 MG tablet Take 1 tablet (25 mg total) by mouth every 6 (six) hours as needed for nausea or vomiting. 30 tablet 2 Past Week at Unknown time  . nitrofurantoin, macrocrystal-monohydrate, (MACROBID) 100 MG capsule Take 1 capsule (100 mg total) by mouth 2 (two) times daily. 1 po BID x 7days (Patient not taking: Reported on 11/25/2014)  14 capsule 2 Unknown at Unknown time  . ondansetron (ZOFRAN) 4 MG tablet Take 1 tablet (4 mg total) by mouth every 8 (eight) hours as needed for nausea or vomiting. 30 tablet 1 More than a month at Unknown time  . Prenat-FeCbn-FeAspGl-FA-Omega (OB COMPLETE PETITE) 35-5-1-200 MG CAPS Take 1 capsule by mouth daily. (Patient not taking: Reported on 11/25/2014) 30 capsule 11 Unknown at Unknown time    Review of Systems  Constitutional: Negative for fever and malaise/fatigue.  Respiratory: Positive for cough and sputum production. Negative for shortness of breath and wheezing.   Gastrointestinal: Positive for abdominal pain.  Genitourinary: Negative for dysuria, urgency and frequency.       Neg - vaginal bleeding  Musculoskeletal: Positive for back pain.   Physical Exam   Blood pressure 129/85, pulse 79, temperature 98.4 F (36.9 C), temperature source Oral, resp. rate 16, height 5\' 3"  (1.6 m), weight 244 lb (110.678 kg), last menstrual period 07/14/2014, SpO2 99 %.  Physical Exam  Constitutional: She is oriented to person, place, and time. She appears well-developed and well-nourished. No distress.  HENT:  Head: Normocephalic.  Cardiovascular: Normal rate, regular rhythm, normal heart sounds and intact distal pulses.   Respiratory: Effort normal and breath sounds normal. No respiratory distress. She has  no wheezes. She has no rales. She exhibits tenderness (mild right lateral posterior chest tenderness to palpation).  GI: Soft. She exhibits no distension. There is no tenderness. There is no rebound, no guarding and no CVA tenderness.  Musculoskeletal: She exhibits no edema.       Arms: No edema noted in the bilateral lower extremities. Lower extremities are equal size bilaterally.  No evidence of varicose veins, ecchymosis or erythema noted. Negative Homan's sign. Mild tenderness to palpation of the left calf, no ecchymosis noted at that site.   Neurological: She is alert and oriented to  person, place, and time.  Skin: Skin is warm and dry. No erythema.  Psychiatric: She has a normal mood and affect.   Results for orders placed or performed during the hospital encounter of 12/15/14 (from the past 24 hour(s))  Urinalysis, Routine w reflex microscopic     Status: Abnormal   Collection Time: 12/15/14 10:32 PM  Result Value Ref Range   Color, Urine YELLOW YELLOW   APPearance CLOUDY (A) CLEAR   Specific Gravity, Urine 1.020 1.005 - 1.030   pH 6.5 5.0 - 8.0   Glucose, UA NEGATIVE NEGATIVE mg/dL   Hgb urine dipstick NEGATIVE NEGATIVE   Bilirubin Urine NEGATIVE NEGATIVE   Ketones, ur 15 (A) NEGATIVE mg/dL   Protein, ur NEGATIVE NEGATIVE mg/dL   Urobilinogen, UA 0.2 0.0 - 1.0 mg/dL   Nitrite NEGATIVE NEGATIVE   Leukocytes, UA SMALL (A) NEGATIVE  Urine microscopic-add on     Status: Abnormal   Collection Time: 12/15/14 10:32 PM  Result Value Ref Range   Squamous Epithelial / LPF MANY (A) RARE   WBC, UA 3-6 <3 WBC/hpf   Bacteria, UA MANY (A) RARE   Crystals CA OXALATE CRYSTALS (A) NEGATIVE   Urine-Other MUCOUS PRESENT    Dg Chest 2 View  12/15/2014   CLINICAL DATA:  Chronic cough.  Initial encounter.  EXAM: CHEST  2 VIEW  COMPARISON:  Chest radiograph and CTA of the chest performed 08/14/2014  FINDINGS: The lungs are well-aerated and clear. There is no evidence of focal opacification, pleural effusion or pneumothorax.  The heart is normal in size; the mediastinal contour is within normal limits. No acute osseous abnormalities are seen.  IMPRESSION: No acute cardiopulmonary process seen.   Electronically Signed   By: Garald Balding M.D.   On: 12/15/2014 23:56    MAU Course  Procedures None  MDM FHR - 145 bpm with doppler UA and chest xray today given patient's recent history of pneumonia and current productive cough Urine culture pending Discussed patient with dr. Jodi Mourning. He agrees with assessment of costochondritis and plan to treat with Tylenol, ice and cough  suppressants.   Assessment and Plan  A: SIUP at [redacted]w[redacted]d Costochondritis  P: Discharge home List of OTC medications safe in pregnancy given for symptomatic relief Tylenol PRN for pain advised Ice to the affected area also advised Warning signs for worsening condition discussed Urine culture pending Patient advised to follow-up with Dr. Jodi Mourning as scheduled for routine prenatal care Patient may return to MAU as needed or if her condition were to change or worsen   Luvenia Redden, PA-C  12/16/2014, 12:16 AM

## 2014-12-16 DIAGNOSIS — M940 Chondrocostal junction syndrome [Tietze]: Secondary | ICD-10-CM

## 2014-12-16 NOTE — Discharge Instructions (Signed)
Costochondritis Costochondritis is a condition in which the tissue (cartilage) that connects your ribs with your breastbone (sternum) becomes irritated. It causes pain in the chest and rib area. It usually goes away on its own over time. HOME CARE  Avoid activities that wear you out.  Do not strain your ribs. Avoid activities that use your:  Chest.  Belly.  Side muscles.  Put ice on the area for the first 2 days after the pain starts.  Put ice in a plastic bag.  Place a towel between your skin and the bag.  Leave the ice on for 20 minutes, 2-3 times a day.  Only take medicine as told by your doctor. GET HELP IF:  You have redness or puffiness (swelling) in the rib area.  Your pain does not go away with rest or medicine. GET HELP RIGHT AWAY IF:   Your pain gets worse.  You are very uncomfortable.  You have trouble breathing.  You cough up blood.  You start sweating or throwing up (vomiting).  You have a fever or lasting symptoms for more than 2-3 days.  You have a fever and your symptoms suddenly get worse. MAKE SURE YOU:   Understand these instructions.  Will watch your condition.  Will get help right away if you are not doing well or get worse. Document Released: 02/29/2008 Document Revised: 05/15/2013 Document Reviewed: 04/16/2013 [facility] Patient Information 2015 [place], [place]. This information is not intended to replace advice given to you by your health care provider. Make sure you discuss any questions you have with your health care provider.

## 2014-12-17 LAB — CULTURE, OB URINE: Colony Count: 1000

## 2014-12-22 ENCOUNTER — Encounter (HOSPITAL_COMMUNITY): Payer: Self-pay | Admitting: *Deleted

## 2014-12-22 ENCOUNTER — Inpatient Hospital Stay (HOSPITAL_COMMUNITY)
Admission: AD | Admit: 2014-12-22 | Discharge: 2014-12-22 | Disposition: A | Discharge status: Home / Self Care | Payer: Medicaid Other | Source: Ambulatory Visit | Attending: Obstetrics | Admitting: Obstetrics

## 2014-12-22 DIAGNOSIS — Z87891 Personal history of nicotine dependence: Secondary | ICD-10-CM | POA: Diagnosis not present

## 2014-12-22 DIAGNOSIS — Z880 Allergy status to penicillin: Secondary | ICD-10-CM

## 2014-12-22 DIAGNOSIS — R1032 Left lower quadrant pain: Secondary | ICD-10-CM | POA: Diagnosis not present

## 2014-12-22 DIAGNOSIS — O9989 Other specified diseases and conditions complicating pregnancy, childbirth and the puerperium: Secondary | ICD-10-CM | POA: Insufficient documentation
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
## 2014-12-22 NOTE — MAU Note (Signed)
Pt. Urine in lab 
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
## 2014-12-22 NOTE — Discharge Instructions (Signed)

## 2014-12-23 ENCOUNTER — Encounter: Payer: Self-pay | Admitting: Obstetrics

## 2014-12-23 ENCOUNTER — Ambulatory Visit (INDEPENDENT_AMBULATORY_CARE_PROVIDER_SITE_OTHER): Payer: Medicaid Other | Admitting: Obstetrics

## 2014-12-23 VITALS — BP 131/79 | HR 86 | Wt 242.0 lb

## 2014-12-23 DIAGNOSIS — R52 Pain, unspecified: Secondary | ICD-10-CM

## 2014-12-23 DIAGNOSIS — Z3482 Encounter for supervision of other normal pregnancy, second trimester: Secondary | ICD-10-CM

## 2014-12-23 LAB — POCT URINALYSIS DIPSTICK
Bilirubin, UA: NEGATIVE
Blood, UA: NEGATIVE
Glucose, UA: NEGATIVE
Nitrite, UA: NEGATIVE
PH UA: 7
SPEC GRAV UA: 1.01
UROBILINOGEN UA: NEGATIVE

## 2014-12-23 MED ORDER — OXYCODONE HCL 10 MG PO TABS: 10.0000 mg | ORAL_TABLET | Freq: Four times a day (QID) | ORAL | Status: DC | PRN
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
## 2014-12-31 ENCOUNTER — Telehealth: Payer: Self-pay | Admitting: *Deleted
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
## 2015-01-19 ENCOUNTER — Other Ambulatory Visit: Payer: Self-pay | Admitting: Obstetrics

## 2015-01-19 ENCOUNTER — Encounter: Payer: Self-pay | Admitting: Obstetrics

## 2015-01-19 ENCOUNTER — Other Ambulatory Visit: Payer: Medicaid Other

## 2015-01-19 ENCOUNTER — Ambulatory Visit (INDEPENDENT_AMBULATORY_CARE_PROVIDER_SITE_OTHER): Payer: Medicaid Other | Admitting: Obstetrics

## 2015-01-19 VITALS — BP 120/76 | HR 86 | Temp 98.2°F | Wt 251.0 lb

## 2015-01-19 DIAGNOSIS — Z3482 Encounter for supervision of other normal pregnancy, second trimester: Secondary | ICD-10-CM

## 2015-01-19 DIAGNOSIS — Z3483 Encounter for supervision of other normal pregnancy, third trimester: Secondary | ICD-10-CM

## 2015-01-19 LAB — POCT URINALYSIS DIPSTICK
Bilirubin, UA: NEGATIVE
Glucose, UA: NEGATIVE
Ketones, UA: NEGATIVE
Nitrite, UA: NEGATIVE
PH UA: 6.5
Protein, UA: NEGATIVE
RBC UA: NEGATIVE
SPEC GRAV UA: 1.01
Urobilinogen, UA: NEGATIVE

## 2015-01-19 NOTE — Progress Notes (Signed)
Subjective:    Katherine Douglas is a 34 y.o. female being seen today for her obstetrical visit. She is at [redacted]w[redacted]d gestation. Patient reports: no complaints . Fetal movement: normal.  Problem List Items Addressed This Visit    None    Visit Diagnoses    Encounter for supervision of other normal pregnancy in second trimester    -  Primary    Relevant Orders    POCT urinalysis dipstick (Completed)      Patient Active Problem List   Diagnosis Date Noted  . Pain in the chest   . SOB (shortness of breath)   . Tachycardia   . Chest pain 08/14/2014  . Sepsis 08/14/2014  . Pneumonia 08/14/2014  . Microcytic anemia 08/14/2014  . Tobacco abuse 08/14/2014   Objective:    BP 120/76 mmHg  Pulse 86  Temp(Src) 98.2 F (36.8 C)  Wt 251 lb (113.853 kg)  LMP 07/14/2014 FHT: 150 BPM  Uterine Size: size equals dates     Assessment:    Pregnancy @ [redacted]w[redacted]d    Plan:    OBGCT: ordered.  Labs, problem list reviewed and updated 2 hr GTT planned Follow up in 2 weeks.

## 2015-01-20 LAB — CBC
HEMATOCRIT: 28.7 % — AB (ref 36.0–46.0)
Hemoglobin: 9 g/dL — ABNORMAL LOW (ref 12.0–15.0)
MCH: 26.1 pg (ref 26.0–34.0)
MCHC: 31.4 g/dL (ref 30.0–36.0)
MCV: 83.2 fL (ref 78.0–100.0)
MPV: 8.7 fL (ref 8.6–12.4)
Platelets: 455 10*3/uL — ABNORMAL HIGH (ref 150–400)
RBC: 3.45 MIL/uL — AB (ref 3.87–5.11)
RDW: 17.2 % — ABNORMAL HIGH (ref 11.5–15.5)
WBC: 10.3 10*3/uL (ref 4.0–10.5)

## 2015-01-20 LAB — GLUCOSE TOLERANCE, 2 HOURS W/ 1HR
GLUCOSE, 2 HOUR: 81 mg/dL (ref 70–139)
GLUCOSE, FASTING: 75 mg/dL (ref 70–99)
GLUCOSE: 132 mg/dL (ref 70–170)

## 2015-01-21 LAB — RPR

## 2015-02-03 ENCOUNTER — Ambulatory Visit (INDEPENDENT_AMBULATORY_CARE_PROVIDER_SITE_OTHER): Payer: Medicaid Other | Admitting: Obstetrics

## 2015-02-03 VITALS — BP 123/76 | HR 78 | Temp 99.2°F | Wt 246.0 lb

## 2015-02-03 DIAGNOSIS — Z3483 Encounter for supervision of other normal pregnancy, third trimester: Secondary | ICD-10-CM

## 2015-02-03 LAB — POCT URINALYSIS DIPSTICK
Bilirubin, UA: NEGATIVE
Blood, UA: NEGATIVE
Glucose, UA: NEGATIVE
Ketones, UA: NEGATIVE
NITRITE UA: NEGATIVE
PROTEIN UA: NEGATIVE
SPEC GRAV UA: 1.015
UROBILINOGEN UA: NEGATIVE
pH, UA: 7

## 2015-02-04 ENCOUNTER — Encounter: Payer: Self-pay | Admitting: Obstetrics

## 2015-02-04 LAB — HIV ANTIBODY (ROUTINE TESTING W REFLEX): HIV 1&2 Ab, 4th Generation: NONREACTIVE

## 2015-02-04 NOTE — Progress Notes (Signed)
Subjective:    Katherine Douglas is a 34 y.o. female being seen today for her obstetrical visit. She is at [redacted]w[redacted]d gestation. Patient reports no complaints. Fetal movement: normal.  Problem List Items Addressed This Visit    None    Visit Diagnoses    Encounter for supervision of other normal pregnancy in third trimester    -  Primary    Relevant Orders    POCT urinalysis dipstick (Completed)    HIV antibody (Completed)      Patient Active Problem List   Diagnosis Date Noted  . Pain in the chest   . SOB (shortness of breath)   . Tachycardia   . Chest pain 08/14/2014  . Sepsis 08/14/2014  . Pneumonia 08/14/2014  . Microcytic anemia 08/14/2014  . Tobacco abuse 08/14/2014   Objective:    BP 123/76 mmHg  Pulse 78  Temp(Src) 99.2 F (37.3 C)  Wt 246 lb (111.585 kg)  LMP 07/14/2014 FHT:  150 BPM  Uterine Size: size equals dates  Presentation: unsure     Assessment:    Pregnancy @ [redacted]w[redacted]d weeks   Plan:     labs reviewed, problem list updated Consent signed. GBS sent TDAP offered  Rhogam given for RH negative Pediatrician: discussed. Infant feeding: plans to breastfeed. Maternity leave: discussed. Cigarette smoking: former smoker. Orders Placed This Encounter  Procedures  . HIV antibody  . POCT urinalysis dipstick   No orders of the defined types were placed in this encounter.   Follow up in 2 Weeks.

## 2015-02-17 ENCOUNTER — Encounter: Payer: Self-pay | Admitting: Obstetrics

## 2015-02-17 ENCOUNTER — Ambulatory Visit (INDEPENDENT_AMBULATORY_CARE_PROVIDER_SITE_OTHER): Payer: Medicaid Other | Admitting: Obstetrics

## 2015-02-17 VITALS — BP 123/77 | HR 99 | Temp 99.2°F | Wt 247.0 lb

## 2015-02-17 DIAGNOSIS — Z3483 Encounter for supervision of other normal pregnancy, third trimester: Secondary | ICD-10-CM

## 2015-02-17 DIAGNOSIS — G44009 Cluster headache syndrome, unspecified, not intractable: Secondary | ICD-10-CM

## 2015-02-17 LAB — POCT URINALYSIS DIPSTICK
Bilirubin, UA: NEGATIVE
GLUCOSE UA: NEGATIVE
KETONES UA: NEGATIVE
LEUKOCYTES UA: NEGATIVE
Nitrite, UA: NEGATIVE
Protein, UA: NEGATIVE
RBC UA: NEGATIVE
SPEC GRAV UA: 1.015
UROBILINOGEN UA: NEGATIVE
pH, UA: 6

## 2015-02-17 MED ORDER — BUTALBITAL-APAP-CAFFEINE 50-325-40 MG PO TABS: 2.0000 | ORAL_TABLET | Freq: Four times a day (QID) | ORAL | Status: DC | PRN

## 2015-02-17 NOTE — Progress Notes (Signed)
Subjective:    Katherine Douglas is a 34 y.o. female being seen today for her obstetrical visit. She is at [redacted]w[redacted]d gestation. Patient reports headache. Fetal movement: normal.  Problem List Items Addressed This Visit    None    Visit Diagnoses    Encounter for supervision of other normal pregnancy in third trimester    -  Primary    Relevant Orders    POCT urinalysis dipstick (Completed)    Cluster headache, not intractable, unspecified chronicity pattern        Relevant Medications    butalbital-acetaminophen-caffeine (FIORICET) 50-325-40 MG per tablet      Patient Active Problem List   Diagnosis Date Noted  . Pain in the chest   . SOB (shortness of breath)   . Tachycardia   . Chest pain 08/14/2014  . Sepsis 08/14/2014  . Pneumonia 08/14/2014  . Microcytic anemia 08/14/2014  . Tobacco abuse 08/14/2014   Objective:    BP 123/77 mmHg  Pulse 99  Temp(Src) 99.2 F (37.3 C)  Wt 247 lb (112.038 kg)  LMP 07/14/2014 FHT:  150 BPM  Uterine Size: size equals dates  Presentation: unsure     Assessment:    Pregnancy @ [redacted]w[redacted]d weeks    Headache  Plan:   Fioricet Rx    labs reviewed, problem list updated Consent signed. GBS sent TDAP offered  Rhogam given for RH negative Pediatrician: discussed. Infant feeding: plans to breastfeed. Maternity leave: discussed. Cigarette smoking: former smoker. Orders Placed This Encounter  Procedures  . POCT urinalysis dipstick   Meds ordered this encounter  Medications  . DISCONTD: butalbital-acetaminophen-caffeine (FIORICET) 50-325-40 MG per tablet    Sig: Take 2 tablets by mouth every 6 (six) hours as needed for headache.    Dispense:  40 tablet    Refill:  2  . butalbital-acetaminophen-caffeine (FIORICET) 50-325-40 MG per tablet    Sig: Take 2 tablets by mouth every 6 (six) hours as needed for headache.    Dispense:  40 tablet    Refill:  2   Follow up in 2 Weeks.

## 2015-03-04 ENCOUNTER — Ambulatory Visit (INDEPENDENT_AMBULATORY_CARE_PROVIDER_SITE_OTHER): Payer: Medicaid Other | Admitting: Obstetrics

## 2015-03-04 ENCOUNTER — Encounter: Payer: Self-pay | Admitting: Obstetrics

## 2015-03-04 VITALS — BP 140/82 | HR 82 | Temp 98.0°F | Wt 249.0 lb

## 2015-03-04 DIAGNOSIS — O0993 Supervision of high risk pregnancy, unspecified, third trimester: Secondary | ICD-10-CM | POA: Diagnosis not present

## 2015-03-04 DIAGNOSIS — G44009 Cluster headache syndrome, unspecified, not intractable: Secondary | ICD-10-CM

## 2015-03-04 DIAGNOSIS — Z3482 Encounter for supervision of other normal pregnancy, second trimester: Secondary | ICD-10-CM

## 2015-03-04 LAB — POCT URINALYSIS DIPSTICK
Bilirubin, UA: NEGATIVE
Glucose, UA: NEGATIVE
Ketones, UA: NEGATIVE
Leukocytes, UA: NEGATIVE
Nitrite, UA: NEGATIVE
Protein, UA: NEGATIVE
RBC UA: NEGATIVE
Spec Grav, UA: 1.015
Urobilinogen, UA: NEGATIVE
pH, UA: 6.5

## 2015-03-04 NOTE — Progress Notes (Signed)
Subjective:    Katherine Douglas is a 34 y.o. female being seen today for her obstetrical visit. She is at [redacted]w[redacted]d gestation. Patient reports headache and dizziness with palpatations. Fetal movement: normal.  Problem List Items Addressed This Visit    None    Visit Diagnoses    Encounter for supervision of other normal pregnancy in second trimester    -  Primary    Relevant Orders    POCT urinalysis dipstick (Completed)      Patient Active Problem List   Diagnosis Date Noted  . Pain in the chest   . SOB (shortness of breath)   . Tachycardia   . Chest pain 08/14/2014  . Sepsis 08/14/2014  . Pneumonia 08/14/2014  . Microcytic anemia 08/14/2014  . Tobacco abuse 08/14/2014   Objective:    BP 140/82 mmHg  Pulse 82  Temp(Src) 98 F (36.7 C)  Wt 249 lb (112.946 kg)  LMP 07/14/2014 FHT:  150 BPM  Uterine Size: size equals dates  Presentation: unsure     Assessment:    Pregnancy @ [redacted]w[redacted]d weeks    HA, dizziness and palpitations.  Plan:   Patient advised to go to [facility] ER next time above symptoms occur.   labs reviewed, problem list updated Consent signed. GBS sent TDAP offered  Rhogam given for RH negative Pediatrician: discussed. Infant feeding: plans to breastfeed. Maternity leave: discussed. Cigarette smoking: former smoker. Orders Placed This Encounter  Procedures  . POCT urinalysis dipstick   No orders of the defined types were placed in this encounter.   Follow up in 1 Week.

## 2015-03-05 LAB — OB RESULTS CONSOLE GBS: STREP GROUP B AG: NEGATIVE

## 2015-03-05 NOTE — Addendum Note (Signed)
Addended by: Carole Binning on: 03/05/2015 11:48 AM   Modules accepted: Orders

## 2015-03-08 LAB — CULTURE, STREPTOCOCCUS GRP B W/SUSCEPT

## 2015-03-11 ENCOUNTER — Ambulatory Visit (INDEPENDENT_AMBULATORY_CARE_PROVIDER_SITE_OTHER): Payer: Medicaid Other | Admitting: Obstetrics

## 2015-03-11 ENCOUNTER — Encounter: Payer: Self-pay | Admitting: Obstetrics

## 2015-03-11 VITALS — BP 133/86 | HR 83 | Temp 97.9°F | Wt 250.0 lb

## 2015-03-11 DIAGNOSIS — Z3483 Encounter for supervision of other normal pregnancy, third trimester: Secondary | ICD-10-CM

## 2015-03-11 DIAGNOSIS — O0993 Supervision of high risk pregnancy, unspecified, third trimester: Secondary | ICD-10-CM | POA: Diagnosis not present

## 2015-03-11 NOTE — Progress Notes (Signed)
Subjective:    Katherine Douglas is a 34 y.o. female being seen today for her obstetrical visit. She is at [redacted]w[redacted]d gestation. Patient reports backache. Fetal movement: normal.  Problem List Items Addressed This Visit    None     Patient Active Problem List   Diagnosis Date Noted  . Pain in the chest   . SOB (shortness of breath)   . Tachycardia   . Chest pain 08/14/2014  . Sepsis 08/14/2014  . Pneumonia 08/14/2014  . Microcytic anemia 08/14/2014  . Tobacco abuse 08/14/2014   Objective:    BP 133/86 mmHg  Pulse 83  Temp(Src) 97.9 F (36.6 C)  Wt 250 lb (113.399 kg)  LMP 07/14/2014 FHT:  150 BPM  Uterine Size: size equals dates  Presentation: unsure     Assessment:    Pregnancy @ [redacted]w[redacted]d weeks   Plan:     labs reviewed, problem list updated Consent signed. GBS sent TDAP offered  Rhogam given for RH negative Pediatrician: discussed. Infant feeding: plans to breastfeed. Maternity leave: discussed. Cigarette smoking: former smoker. No orders of the defined types were placed in this encounter.   No orders of the defined types were placed in this encounter.   Follow up in 1 Week.

## 2015-03-18 ENCOUNTER — Encounter: Payer: Self-pay | Admitting: Obstetrics

## 2015-03-18 ENCOUNTER — Ambulatory Visit (INDEPENDENT_AMBULATORY_CARE_PROVIDER_SITE_OTHER): Payer: Medicaid Other | Admitting: Obstetrics

## 2015-03-18 VITALS — BP 122/79 | HR 101 | Temp 98.4°F | Wt 246.0 lb

## 2015-03-18 DIAGNOSIS — Z3483 Encounter for supervision of other normal pregnancy, third trimester: Secondary | ICD-10-CM

## 2015-03-18 DIAGNOSIS — O0993 Supervision of high risk pregnancy, unspecified, third trimester: Secondary | ICD-10-CM | POA: Diagnosis not present

## 2015-03-18 LAB — POCT URINALYSIS DIPSTICK
Bilirubin, UA: NEGATIVE
Blood, UA: NEGATIVE
NITRITE UA: NEGATIVE
Spec Grav, UA: 1.01
Urobilinogen, UA: NEGATIVE
pH, UA: 7

## 2015-03-18 NOTE — Progress Notes (Signed)
Subjective:    Katherine Douglas is a 34 y.o. female being seen today for her obstetrical visit. She is at [redacted]w[redacted]d gestation. Patient reports headache and anxiety. Fetal movement: normal.  Problem List Items Addressed This Visit    None     Patient Active Problem List   Diagnosis Date Noted  . Pain in the chest   . SOB (shortness of breath)   . Tachycardia   . Chest pain 08/14/2014  . Sepsis 08/14/2014  . Pneumonia 08/14/2014  . Microcytic anemia 08/14/2014  . Tobacco abuse 08/14/2014   Objective:    BP 122/79 mmHg  Pulse 101  Temp(Src) 98.4 F (36.9 C)  Wt 246 lb (111.585 kg)  LMP 07/14/2014 FHT:  150 BPM  Uterine Size: size greater than dates  Presentation: unsure    NST:  Reactive.  Occasional mild UC's  Assessment:    Pregnancy @ [redacted]w[redacted]d weeks   Plan:     labs reviewed, problem list updated Consent signed. GBS sent TDAP offered  Rhogam given for RH negative Pediatrician: discussed. Infant feeding: plans to breastfeed. Maternity leave: discussed. Cigarette smoking: former smoker. No orders of the defined types were placed in this encounter.   No orders of the defined types were placed in this encounter.   Follow up in 1 Week.

## 2015-03-18 NOTE — Addendum Note (Signed)
Addended by: Willia Craze on: 03/18/2015 04:46 PM   Modules accepted: Orders

## 2015-03-25 ENCOUNTER — Inpatient Hospital Stay (HOSPITAL_COMMUNITY)
Admission: AD | Admit: 2015-03-25 | Discharge: 2015-03-25 | Disposition: A | Discharge status: Home / Self Care | Payer: Medicaid Other | Source: Ambulatory Visit | Attending: Obstetrics | Admitting: Obstetrics

## 2015-03-25 ENCOUNTER — Encounter (HOSPITAL_COMMUNITY): Payer: Self-pay | Admitting: *Deleted

## 2015-03-25 ENCOUNTER — Encounter: Payer: Medicaid Other | Admitting: Obstetrics

## 2015-03-25 ENCOUNTER — Telehealth: Payer: Self-pay | Admitting: Obstetrics

## 2015-03-25 DIAGNOSIS — D649 Anemia, unspecified: Secondary | ICD-10-CM | POA: Diagnosis not present

## 2015-03-25 DIAGNOSIS — Z880 Allergy status to penicillin: Secondary | ICD-10-CM | POA: Diagnosis not present

## 2015-03-25 DIAGNOSIS — R51 Headache: Secondary | ICD-10-CM | POA: Insufficient documentation

## 2015-03-25 DIAGNOSIS — Z3A36 36 weeks gestation of pregnancy: Secondary | ICD-10-CM | POA: Insufficient documentation

## 2015-03-25 DIAGNOSIS — O26893 Other specified pregnancy related conditions, third trimester: Secondary | ICD-10-CM

## 2015-03-25 DIAGNOSIS — O99013 Anemia complicating pregnancy, third trimester: Secondary | ICD-10-CM | POA: Diagnosis not present

## 2015-03-25 DIAGNOSIS — Z87891 Personal history of nicotine dependence: Secondary | ICD-10-CM | POA: Insufficient documentation

## 2015-03-25 DIAGNOSIS — R519 Headache, unspecified: Secondary | ICD-10-CM

## 2015-03-25 LAB — URINALYSIS, ROUTINE W REFLEX MICROSCOPIC
Bilirubin Urine: NEGATIVE
Glucose, UA: NEGATIVE mg/dL
Hgb urine dipstick: NEGATIVE
KETONES UR: 15 mg/dL — AB
Nitrite: NEGATIVE
Protein, ur: NEGATIVE mg/dL
Specific Gravity, Urine: 1.02 (ref 1.005–1.030)
Urobilinogen, UA: 0.2 mg/dL (ref 0.0–1.0)
pH: 7 (ref 5.0–8.0)

## 2015-03-25 LAB — POCT FERN TEST

## 2015-03-25 LAB — URINE MICROSCOPIC-ADD ON

## 2015-03-25 LAB — CBC
HCT: 26.5 % — ABNORMAL LOW (ref 36.0–46.0)
HEMOGLOBIN: 8.4 g/dL — AB (ref 12.0–15.0)
MCH: 25.5 pg — ABNORMAL LOW (ref 26.0–34.0)
MCHC: 31.7 g/dL (ref 30.0–36.0)
MCV: 80.3 fL (ref 78.0–100.0)
Platelets: 425 10*3/uL — ABNORMAL HIGH (ref 150–400)
RBC: 3.3 MIL/uL — ABNORMAL LOW (ref 3.87–5.11)
RDW: 16.2 % — AB (ref 11.5–15.5)
WBC: 10.1 10*3/uL (ref 4.0–10.5)

## 2015-03-25 LAB — WET PREP, GENITAL
CLUE CELLS WET PREP: NONE SEEN
TRICH WET PREP: NONE SEEN
YEAST WET PREP: NONE SEEN

## 2015-03-25 MED ORDER — DIPHENHYDRAMINE HCL 50 MG/ML IJ SOLN
25.0000 mg | Freq: Once | INTRAMUSCULAR | Status: DC
Start: 2015-03-25 — End: 2015-03-25

## 2015-03-25 MED ORDER — ACETAMINOPHEN 325 MG PO TABS: 650.0000 mg | ORAL_TABLET | Freq: Once | ORAL | Status: DC

## 2015-03-25 MED ORDER — PROCHLORPERAZINE EDISYLATE 5 MG/ML IJ SOLN
10.0000 mg | Freq: Once | INTRAMUSCULAR | Status: DC
  Filled 2015-03-25: qty 2

## 2015-03-25 MED ORDER — INTEGRA F 125-1 MG PO CAPS: 1.0000 | ORAL_CAPSULE | Freq: Every day | ORAL | Status: DC

## 2015-03-25 MED ORDER — SODIUM CHLORIDE 0.9 % IV SOLN: INTRAVENOUS | Status: DC

## 2015-03-25 MED ORDER — DEXAMETHASONE SODIUM PHOSPHATE 10 MG/ML IJ SOLN: 10.0000 mg | Freq: Once | INTRAMUSCULAR | Status: DC

## 2015-03-25 MED ORDER — CYCLOBENZAPRINE HCL 10 MG PO TABS: ORAL_TABLET | ORAL | Status: DC

## 2015-03-25 MED ORDER — CYCLOBENZAPRINE HCL 10 MG PO TABS: 10.0000 mg | ORAL_TABLET | Freq: Every day | ORAL | Status: DC

## 2015-03-25 NOTE — Telephone Encounter (Signed)
[phone] -  Patient called with recrurring headache. Moved appt up to 11:30am and patient was a no show. Called and pt enroute to appt - spoke to mother and advised to call patient and send to [facility] to be evaluated as she was a no show for appt.brm
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
## 2015-03-25 NOTE — MAU Note (Signed)
Patient sent office at [redacted] weeks gestation with headache X 31/2 weeks; intermittent abdominal cramps X 2 weeks; diarrhea X 3 days and leakage of fluid since Sunday. Fetus active. Denies bleeding.

## 2015-03-25 NOTE — MAU Provider Note (Signed)
History     CSN: [id number]  Arrival date and time: 03/25/15 1232   None     Chief Complaint  Patient presents with  . Headache  . Abdominal Pain  . Rupture of Membranes  . Diarrhea   HPI  [name] is a 34 y.o. [phone] at [redacted]w[redacted]d here with report of headaches that started one month ago.  Frontal headache today.  Headache shifts from occipital area to frontal area.  No photophobia.  +nausea that improves with eating ice.  No report of vomiting.  +cramping.  Questionable vaginal discharge three days ago, underwear were wet.  Small amount of discharge present today.  +vaginal odor, no itching.    Past Medical History  Diagnosis Date  . Stress   . Panic attacks   . Diabetes mellitus without complication     pt states was gestational  . Pneumonia   . Headache     Past Surgical History  Procedure Laterality Date  . Induced abortion      Family History  Problem Relation Age of Onset  . Hypertension Mother   . Heart failure Mother   . Diabetes Father     History  Substance Use Topics  . Smoking status: Former Smoker -- 0.25 packs/day for 18 years    Types: Cigarettes    Quit date: 10/16/2014  . Smokeless tobacco: Never Used  . Alcohol Use: No    Allergies:  Allergies  Allergen Reactions  . Other Anaphylaxis    Crawfish   . Amoxicillin Hives  . Peanut-Containing Drug Products Itching  . Penicillins Itching  . Strawberry Hives, Itching and Rash    Prescriptions prior to admission  Medication Sig Dispense Refill Last Dose  . acetaminophen (TYLENOL) 325 MG tablet Take 325 mg by mouth every 6 (six) hours as needed.   03/24/2015 at Unknown time  . butalbital-acetaminophen-caffeine (FIORICET) 50-325-40 MG per tablet Take 2 tablets by mouth every 6 (six) hours as needed for headache. 40 tablet 2 Past Week at Unknown time  . promethazine (PHENERGAN) 25 MG tablet Take 1 tablet (25 mg total) by mouth every 6 (six) hours as needed for nausea or vomiting. 30  tablet 2 03/24/2015 at Unknown time  . Oxycodone HCl 10 MG TABS Take 1 tablet (10 mg total) by mouth every 6 (six) hours as needed. (Patient not taking: Reported on 01/19/2015) 40 tablet 0 Not Taking  . Prenat-FeCbn-FeAspGl-FA-Omega (OB COMPLETE PETITE) 35-5-1-200 MG CAPS Take 1 capsule by mouth daily. (Patient not taking: Reported on 03/25/2015) 30 capsule 11 Taking    Review of Systems  Constitutional: Negative for fever and chills.  Eyes: Negative for blurred vision, double vision and photophobia.  Respiratory: Negative for shortness of breath.   Cardiovascular: Positive for palpitations. Negative for chest pain.  Gastrointestinal: Positive for nausea and abdominal pain. Negative for vomiting. Diarrhea: cramping.  Genitourinary: Negative for dysuria, urgency and frequency.  Neurological: Positive for dizziness and headaches.  All other systems reviewed and are negative.  Physical Exam   Blood pressure 134/79, pulse 87, temperature 98.2 F (36.8 C), temperature source Oral, resp. rate 16, height 5\' 3"  (1.6 m), weight 113.172 kg (249 lb 8 oz), last menstrual period 07/14/2014.  Physical Exam  Constitutional: She is oriented to person, place, and time. She appears well-developed and well-nourished. No distress.  HENT:  Head: Normocephalic.  Eyes: EOM are normal. Pupils are equal, round, and reactive to light.  Neck: Normal range of motion. Neck supple.  Cardiovascular: Normal rate, regular rhythm and normal heart sounds.   Respiratory: Effort normal and breath sounds normal.  GI: Soft. There is no tenderness.  Genitourinary: No bleeding in the vagina. Vaginal discharge (mucusy; +odor) found.  Neurological: She is alert and oriented to person, place, and time.  Skin: Skin is warm and dry.    MAU Course  Procedures Labs:  Wet prep, CBC  Results for orders placed or performed during the hospital encounter of 03/25/15 (from the past 24 hour(s))  Urinalysis, Routine w reflex  microscopic (not at [facility])     Status: Abnormal   Collection Time: 03/25/15  1:00 PM  Result Value Ref Range   Color, Urine YELLOW YELLOW   APPearance CLOUDY (A) CLEAR   Specific Gravity, Urine 1.020 1.005 - 1.030   pH 7.0 5.0 - 8.0   Glucose, UA NEGATIVE NEGATIVE mg/dL   Hgb urine dipstick NEGATIVE NEGATIVE   Bilirubin Urine NEGATIVE NEGATIVE   Ketones, ur 15 (A) NEGATIVE mg/dL   Protein, ur NEGATIVE NEGATIVE mg/dL   Urobilinogen, UA 0.2 0.0 - 1.0 mg/dL   Nitrite NEGATIVE NEGATIVE   Leukocytes, UA MODERATE (A) NEGATIVE  Urine microscopic-add on     Status: Abnormal   Collection Time: 03/25/15  1:00 PM  Result Value Ref Range   Squamous Epithelial / LPF MANY (A) RARE   WBC, UA 3-6 <3 WBC/hpf   Bacteria, UA FEW (A) RARE  CBC     Status: Abnormal   Collection Time: 03/25/15  3:06 PM  Result Value Ref Range   WBC 10.1 4.0 - 10.5 K/uL   RBC 3.30 (L) 3.87 - 5.11 MIL/uL   Hemoglobin 8.4 (L) 12.0 - 15.0 g/dL   HCT 26.5 (L) 36.0 - 46.0 %   MCV 80.3 78.0 - 100.0 fL   MCH 25.5 (L) 26.0 - 34.0 pg   MCHC 31.7 30.0 - 36.0 g/dL   RDW 16.2 (H) 11.5 - 15.5 %   Platelets 425 (H) 150 - 400 K/uL  Wet prep, genital     Status: Abnormal   Collection Time: 03/25/15  3:30 PM  Result Value Ref Range   Yeast Wet Prep HPF POC NONE SEEN NONE SEEN   Trich, Wet Prep NONE SEEN NONE SEEN   Clue Cells Wet Prep HPF POC NONE SEEN NONE SEEN   WBC, Wet Prep HPF POC FEW (A) NONE SEEN  Fern Test     Status: Normal   Collection Time: 03/25/15  3:41 PM  Result Value Ref Range   POCT [place] Test      73 Consulted with Dr. Jodi Mourning > Reviewed HPI/Exam/labs/OB hx > give IV migraine cocktail > discussed with patient, driving self home (benadryl), desires to try flexeril at home, if not relief will return for migraine cocktail  Assessment and Plan  34 y.o. [id number] at [redacted]w[redacted]d IUP Anemia Headaches in Pregnancy  Plan: Discharge to home RX Flexeril 10 mg  Integra  Urine culture to lab  Sharyon Medicus, [facility]  N 03/25/2015, 4:02 PM

## 2015-03-27 LAB — URINE CULTURE

## 2015-03-31 NOTE — Telephone Encounter (Signed)
[id number] - Patient seen. brm

## 2015-04-01 ENCOUNTER — Encounter: Payer: Self-pay | Admitting: Obstetrics

## 2015-04-01 ENCOUNTER — Encounter: Payer: Self-pay | Admitting: *Deleted

## 2015-04-01 ENCOUNTER — Ambulatory Visit (INDEPENDENT_AMBULATORY_CARE_PROVIDER_SITE_OTHER): Payer: Medicaid Other | Admitting: Obstetrics

## 2015-04-01 VITALS — BP 124/72 | HR 87 | Temp 97.8°F | Wt 253.0 lb

## 2015-04-01 DIAGNOSIS — Z3483 Encounter for supervision of other normal pregnancy, third trimester: Secondary | ICD-10-CM

## 2015-04-01 LAB — POCT URINALYSIS DIPSTICK
Bilirubin, UA: NEGATIVE
Blood, UA: NEGATIVE
Glucose, UA: NEGATIVE
KETONES UA: NEGATIVE
Nitrite, UA: NEGATIVE
PROTEIN UA: NEGATIVE
Spec Grav, UA: 1.01
Urobilinogen, UA: NEGATIVE
pH, UA: 7

## 2015-04-01 NOTE — Progress Notes (Signed)
Subjective:    Katherine Douglas is a 34 y.o. female being seen today for her obstetrical visit. She is at [redacted]w[redacted]d gestation. Patient reports no complaints. Fetal movement: normal.  Problem List Items Addressed This Visit    None    Visit Diagnoses    Encounter for supervision of other normal pregnancy in third trimester    -  Primary    Relevant Orders    POCT urinalysis dipstick (Completed)      Patient Active Problem List   Diagnosis Date Noted  . Pain in the chest   . SOB (shortness of breath)   . Tachycardia   . Chest pain 08/14/2014  . Sepsis 08/14/2014  . Pneumonia 08/14/2014  . Microcytic anemia 08/14/2014  . Tobacco abuse 08/14/2014    Objective:    BP 124/72 mmHg  Pulse 87  Temp(Src) 97.8 F (36.6 C)  Wt 253 lb (114.76 kg)  LMP 07/14/2014 FHT: 150 BPM  Uterine Size: size equals dates  Presentations: unsure    Assessment:    Pregnancy @ [redacted]w[redacted]d weeks   Plan:   Plans for delivery: Vaginal anticipated; labs reviewed; problem list updated Counseling: Consent signed. Infant feeding: plans to breastfeed. Cigarette smoking: former smoker. L&D discussion: symptoms of labor, discussed when to call, discussed what number to call, anesthetic/analgesic options reviewed and delivering clinician:  plans no preference. Postpartum supports and preparation: circumcision discussed and contraception plans discussed. Exam Follow up in 1 Week.

## 2015-04-04 ENCOUNTER — Encounter (HOSPITAL_COMMUNITY): Payer: Self-pay | Admitting: *Deleted

## 2015-04-04 ENCOUNTER — Inpatient Hospital Stay (HOSPITAL_COMMUNITY)
Admission: AD | Admit: 2015-04-04 | Discharge: 2015-04-04 | Disposition: A | Discharge status: Home / Self Care | Payer: Medicaid Other | Source: Ambulatory Visit | Attending: Obstetrics | Admitting: Obstetrics

## 2015-04-04 DIAGNOSIS — Z3493 Encounter for supervision of normal pregnancy, unspecified, third trimester: Secondary | ICD-10-CM | POA: Insufficient documentation

## 2015-04-04 LAB — URINE MICROSCOPIC-ADD ON

## 2015-04-04 LAB — URINALYSIS, ROUTINE W REFLEX MICROSCOPIC
BILIRUBIN URINE: NEGATIVE
GLUCOSE, UA: NEGATIVE mg/dL
Ketones, ur: 15 mg/dL — AB
NITRITE: NEGATIVE
PH: 6.5 (ref 5.0–8.0)
Protein, ur: NEGATIVE mg/dL
SPECIFIC GRAVITY, URINE: 1.01 (ref 1.005–1.030)
Urobilinogen, UA: 0.2 mg/dL (ref 0.0–1.0)

## 2015-04-04 NOTE — MAU Note (Signed)
Pt states here for irregular contractions. Also had small amount of fluid leaking yesterday. States she doesn't feel like herself. States she always has headaches accompanied by blurred vision, and takes fioricet as needed.

## 2015-04-08 ENCOUNTER — Other Ambulatory Visit: Payer: Self-pay | Admitting: *Deleted

## 2015-04-08 ENCOUNTER — Encounter: Payer: Self-pay | Admitting: Obstetrics

## 2015-04-08 ENCOUNTER — Ambulatory Visit (INDEPENDENT_AMBULATORY_CARE_PROVIDER_SITE_OTHER): Payer: Medicaid Other | Admitting: Obstetrics

## 2015-04-08 VITALS — BP 127/80 | HR 107 | Temp 98.7°F | Wt 250.0 lb

## 2015-04-08 DIAGNOSIS — Z3483 Encounter for supervision of other normal pregnancy, third trimester: Secondary | ICD-10-CM

## 2015-04-08 LAB — POCT URINALYSIS DIPSTICK
Bilirubin, UA: NEGATIVE
Blood, UA: NEGATIVE
GLUCOSE UA: NEGATIVE
Ketones, UA: NEGATIVE
NITRITE UA: NEGATIVE
PH UA: 6
SPEC GRAV UA: 1.015
UROBILINOGEN UA: NEGATIVE

## 2015-04-08 NOTE — Progress Notes (Signed)
Subjective:    Katherine Douglas is a 34 y.o. female being seen today for her obstetrical visit. She is at [redacted]w[redacted]d gestation. Patient reports no complaints. Fetal movement: normal.  Problem List Items Addressed This Visit    None    Visit Diagnoses    Encounter for supervision of other normal pregnancy in third trimester    -  Primary    Relevant Orders    POCT urinalysis dipstick (Completed)      Patient Active Problem List   Diagnosis Date Noted  . Pain in the chest   . SOB (shortness of breath)   . Tachycardia   . Chest pain 08/14/2014  . Sepsis 08/14/2014  . Pneumonia 08/14/2014  . Microcytic anemia 08/14/2014  . Tobacco abuse 08/14/2014    Objective:    BP 127/80 mmHg  Pulse 107  Temp(Src) 98.7 F (37.1 C)  Wt 250 lb (113.399 kg)  LMP 07/14/2014 FHT: 150 BPM  Uterine Size: size greater than dates  Presentations: unsure    Assessment:    Pregnancy @ [redacted]w[redacted]d weeks   Plan:   Plans for delivery: Vaginal anticipated; labs reviewed; problem list updated Counseling: Consent signed. Infant feeding: plans to breastfeed. Cigarette smoking: former smoker. L&D discussion: symptoms of labor, discussed when to call, discussed what number to call, anesthetic/analgesic options reviewed and delivering clinician:  plans no preference. Postpartum supports and preparation: circumcision discussed and contraception plans discussed.  Follow up in 1 Week.

## 2015-04-09 ENCOUNTER — Encounter (HOSPITAL_COMMUNITY): Payer: Self-pay | Admitting: *Deleted

## 2015-04-09 ENCOUNTER — Telehealth (HOSPITAL_COMMUNITY): Payer: Self-pay | Admitting: *Deleted

## 2015-04-09 NOTE — Telephone Encounter (Signed)
Preadmission screen  

## 2015-04-14 ENCOUNTER — Encounter (HOSPITAL_COMMUNITY): Payer: Self-pay

## 2015-04-14 ENCOUNTER — Inpatient Hospital Stay (HOSPITAL_COMMUNITY)
Admission: RE | Admit: 2015-04-14 | Discharge: 2015-04-17 | DRG: 775 | Disposition: A | Discharge status: Home / Self Care | Payer: Medicaid Other | Source: Ambulatory Visit | Attending: Obstetrics | Admitting: Obstetrics

## 2015-04-14 DIAGNOSIS — Z87891 Personal history of nicotine dependence: Secondary | ICD-10-CM

## 2015-04-14 DIAGNOSIS — Z3A39 39 weeks gestation of pregnancy: Secondary | ICD-10-CM | POA: Diagnosis present

## 2015-04-14 DIAGNOSIS — O99214 Obesity complicating childbirth: Secondary | ICD-10-CM | POA: Diagnosis present

## 2015-04-14 DIAGNOSIS — Z833 Family history of diabetes mellitus: Secondary | ICD-10-CM

## 2015-04-14 DIAGNOSIS — Z8249 Family history of ischemic heart disease and other diseases of the circulatory system: Secondary | ICD-10-CM

## 2015-04-14 DIAGNOSIS — Z641 Problems related to multiparity: Secondary | ICD-10-CM

## 2015-04-14 DIAGNOSIS — Z6841 Body Mass Index (BMI) 40.0 and over, adult: Secondary | ICD-10-CM | POA: Diagnosis not present

## 2015-04-14 DIAGNOSIS — O9989 Other specified diseases and conditions complicating pregnancy, childbirth and the puerperium: Secondary | ICD-10-CM | POA: Diagnosis present

## 2015-04-14 LAB — RPR: RPR Ser Ql: NONREACTIVE

## 2015-04-14 LAB — CBC
HCT: 28.6 % — ABNORMAL LOW (ref 36.0–46.0)
HEMOGLOBIN: 9.1 g/dL — AB (ref 12.0–15.0)
MCH: 26.1 pg (ref 26.0–34.0)
MCHC: 31.8 g/dL (ref 30.0–36.0)
MCV: 82.2 fL (ref 78.0–100.0)
Platelets: 424 10*3/uL — ABNORMAL HIGH (ref 150–400)
RBC: 3.48 MIL/uL — ABNORMAL LOW (ref 3.87–5.11)
RDW: 17.3 % — AB (ref 11.5–15.5)
WBC: 9.7 10*3/uL (ref 4.0–10.5)

## 2015-04-14 LAB — GLUCOSE, CAPILLARY: GLUCOSE-CAPILLARY: 99 mg/dL (ref 65–99)

## 2015-04-14 LAB — TYPE AND SCREEN
ABO/RH(D): A POS
ANTIBODY SCREEN: NEGATIVE

## 2015-04-14 MED ORDER — OXYTOCIN 40 UNITS IN LACTATED RINGERS INFUSION - SIMPLE MED
62.5000 mL/h | INTRAVENOUS | Status: DC
  Administered 2015-04-15: 62.5 mL/h via INTRAVENOUS

## 2015-04-14 MED ORDER — EPHEDRINE 5 MG/ML INJ: 10.0000 mg | INTRAVENOUS | Status: DC | PRN

## 2015-04-14 MED ORDER — ONDANSETRON HCL 4 MG/2ML IJ SOLN: 4.0000 mg | Freq: Four times a day (QID) | INTRAMUSCULAR | Status: DC | PRN

## 2015-04-14 MED ORDER — OXYCODONE-ACETAMINOPHEN 5-325 MG PO TABS: 2.0000 | ORAL_TABLET | ORAL | Status: DC | PRN

## 2015-04-14 MED ORDER — OXYTOCIN BOLUS FROM INFUSION: 500.0000 mL | INTRAVENOUS | Status: DC

## 2015-04-14 MED ORDER — LACTATED RINGERS IV SOLN
INTRAVENOUS | Status: DC
  Administered 2015-04-14 – 2015-04-15 (×5): via INTRAVENOUS

## 2015-04-14 MED ORDER — TERBUTALINE SULFATE 1 MG/ML IJ SOLN: 0.2500 mg | Freq: Once | INTRAMUSCULAR | Status: AC | PRN

## 2015-04-14 MED ORDER — BUTORPHANOL TARTRATE 1 MG/ML IJ SOLN
1.0000 mg | INTRAMUSCULAR | Status: DC | PRN
  Administered 2015-04-14 – 2015-04-15 (×2): 1 mg via INTRAVENOUS
  Filled 2015-04-14 (×2): qty 1

## 2015-04-14 MED ORDER — MISOPROSTOL 25 MCG QUARTER TABLET
25.0000 ug | ORAL_TABLET | ORAL | Status: DC
  Administered 2015-04-14 (×2): 25 ug via VAGINAL
  Filled 2015-04-14 (×2): qty 0.25

## 2015-04-14 MED ORDER — LACTATED RINGERS IV SOLN
500.0000 mL | INTRAVENOUS | Status: DC | PRN
Start: 2015-04-14 — End: 2015-04-15
  Administered 2015-04-15: 500 mL via INTRAVENOUS

## 2015-04-14 MED ORDER — FENTANYL 2.5 MCG/ML BUPIVACAINE 1/10 % EPIDURAL INFUSION (WH - ANES)
14.0000 mL/h | INTRAMUSCULAR | Status: DC | PRN
  Administered 2015-04-15 (×3): 14 mL/h via EPIDURAL
  Filled 2015-04-14 (×2): qty 125

## 2015-04-14 MED ORDER — OXYCODONE-ACETAMINOPHEN 5-325 MG PO TABS: 1.0000 | ORAL_TABLET | ORAL | Status: DC | PRN

## 2015-04-14 MED ORDER — DIPHENHYDRAMINE HCL 50 MG/ML IJ SOLN: 12.5000 mg | INTRAMUSCULAR | Status: DC | PRN

## 2015-04-14 MED ORDER — OXYTOCIN 40 UNITS IN LACTATED RINGERS INFUSION - SIMPLE MED
1.0000 m[IU]/min | INTRAVENOUS | Status: DC
  Administered 2015-04-15: 2 m[IU]/min via INTRAVENOUS
  Filled 2015-04-14: qty 1000

## 2015-04-14 MED ORDER — ACETAMINOPHEN 325 MG PO TABS: 650.0000 mg | ORAL_TABLET | ORAL | Status: DC | PRN

## 2015-04-14 MED ORDER — CITRIC ACID-SODIUM CITRATE 334-500 MG/5ML PO SOLN: 30.0000 mL | ORAL | Status: DC | PRN

## 2015-04-14 MED ORDER — MISOPROSTOL 25 MCG QUARTER TABLET
25.0000 ug | ORAL_TABLET | Freq: Once | ORAL | Status: AC
  Administered 2015-04-14: 25 ug via VAGINAL
  Filled 2015-04-14: qty 0.25

## 2015-04-14 MED ORDER — PHENYLEPHRINE 40 MCG/ML (10ML) SYRINGE FOR IV PUSH (FOR BLOOD PRESSURE SUPPORT)
80.0000 ug | PREFILLED_SYRINGE | INTRAVENOUS | Status: DC | PRN
  Administered 2015-04-15 (×2): 80 ug via INTRAVENOUS
  Filled 2015-04-14: qty 20

## 2015-04-14 MED ORDER — LIDOCAINE HCL (PF) 1 % IJ SOLN: 30.0000 mL | INTRAMUSCULAR | Status: DC | PRN

## 2015-04-14 MED ORDER — ZOLPIDEM TARTRATE 5 MG PO TABS
5.0000 mg | ORAL_TABLET | Freq: Every day | ORAL | Status: DC
  Administered 2015-04-14: 5 mg via ORAL
  Filled 2015-04-14 (×2): qty 1

## 2015-04-14 NOTE — H&P (Signed)
KRISTIANA JACKO is a 34 y.o. female presenting for IOL, elective. Maternal Medical History:  Fetal activity: Perceived fetal activity is normal.   Last perceived fetal movement was within the past hour.    Prenatal Complications - Diabetes: none.    OB History    Gravida Para Term Preterm AB TAB SAB Ectopic Multiple Living   5 2 2  2 2    2      Past Medical History  Diagnosis Date  . Stress   . Panic attacks   . Pneumonia   . Headache   . Diabetes mellitus without complication     pt states was gestational with previous pregnancy   Past Surgical History  Procedure Laterality Date  . Induced abortion     Family History: family history includes Diabetes in her father; Heart failure in her mother; Hypertension in her mother. Social History:  reports that she quit smoking about 6 months ago. Her smoking use included Cigarettes. She has a 4.5 pack-year smoking history. She has never used smokeless tobacco. She reports that she uses illicit drugs (Marijuana) about 4 times per week. She reports that she does not drink alcohol.   Prenatal Transfer Tool  Maternal Diabetes: No Genetic Screening: Normal Maternal Ultrasounds/Referrals: Normal Fetal Ultrasounds or other Referrals:  None Maternal Substance Abuse:  No Significant Maternal Medications:  None Significant Maternal Lab Results:  None Other Comments:  None  Review of Systems  All other systems reviewed and are negative.   Dilation: 3 Effacement (%): Thick Station: -2 Exam by:: lee Blood pressure 131/70, pulse 83, temperature 98 F (36.7 C), resp. rate 18, height 5\' 2"  (1.575 m), weight 250 lb (113.399 kg), last menstrual period 07/14/2014. Maternal Exam:  Abdomen: Fetal presentation: vertex  Cervix: Cervix evaluated by digital exam.     Physical Exam  Nursing note and vitals reviewed. Constitutional: She is oriented to person, place, and time. She appears well-developed and well-nourished.  HENT:  Head:  Normocephalic and atraumatic.  Eyes: Conjunctivae are normal. Pupils are equal, round, and reactive to light.  Neck: Normal range of motion. Neck supple.  Cardiovascular: Normal rate and regular rhythm.   Respiratory: Effort normal and breath sounds normal.  GI: Soft. Bowel sounds are normal.  Genitourinary: Vagina normal and uterus normal.  Musculoskeletal: Normal range of motion.  Neurological: She is alert and oriented to person, place, and time.  Skin: Skin is warm and dry.  Psychiatric: She has a normal mood and affect. Her behavior is normal. Judgment and thought content normal.    Prenatal labs: ABO, Rh: A/POS/-- (01/05 1110) Antibody: NEG (01/05 1110) Rubella: 1.00 (01/05 1110) RPR: NON REAC (04/25 0000)  HBsAg: NEGATIVE (01/05 1110)  HIV: NONREACTIVE (05/10 1443)  GBS: Negative (06/09 0000)   Assessment/Plan: 39 weeks.  Elective IOL.   HARPER,CHARLES A 04/14/2015, 8:39 AM

## 2015-04-15 ENCOUNTER — Inpatient Hospital Stay (HOSPITAL_COMMUNITY): Payer: Medicaid Other | Admitting: Anesthesiology

## 2015-04-15 ENCOUNTER — Encounter (HOSPITAL_COMMUNITY): Payer: Self-pay

## 2015-04-15 ENCOUNTER — Encounter: Payer: Medicaid Other | Admitting: Obstetrics

## 2015-04-15 MED ORDER — SIMETHICONE 80 MG PO CHEW: 80.0000 mg | CHEWABLE_TABLET | ORAL | Status: DC | PRN

## 2015-04-15 MED ORDER — IBUPROFEN 600 MG PO TABS
600.0000 mg | ORAL_TABLET | Freq: Four times a day (QID) | ORAL | Status: DC
  Administered 2015-04-15 – 2015-04-17 (×8): 600 mg via ORAL
  Filled 2015-04-15 (×8): qty 1

## 2015-04-15 MED ORDER — TETANUS-DIPHTH-ACELL PERTUSSIS 5-2.5-18.5 LF-MCG/0.5 IM SUSP
0.5000 mL | Freq: Once | INTRAMUSCULAR | Status: AC
  Administered 2015-04-16: 0.5 mL via INTRAMUSCULAR
  Filled 2015-04-15: qty 0.5

## 2015-04-15 MED ORDER — WITCH HAZEL-GLYCERIN EX PADS: 1.0000 "application " | MEDICATED_PAD | CUTANEOUS | Status: DC | PRN

## 2015-04-15 MED ORDER — OXYCODONE-ACETAMINOPHEN 5-325 MG PO TABS
2.0000 | ORAL_TABLET | ORAL | Status: DC | PRN
  Administered 2015-04-16: 2 via ORAL
  Filled 2015-04-15 (×2): qty 2

## 2015-04-15 MED ORDER — METHYLERGONOVINE MALEATE 0.2 MG PO TABS: 0.2000 mg | ORAL_TABLET | ORAL | Status: DC | PRN

## 2015-04-15 MED ORDER — ACETAMINOPHEN 325 MG PO TABS: 650.0000 mg | ORAL_TABLET | ORAL | Status: DC | PRN

## 2015-04-15 MED ORDER — DIBUCAINE 1 % RE OINT
1.0000 "application " | TOPICAL_OINTMENT | RECTAL | Status: DC | PRN
  Filled 2015-04-15: qty 28

## 2015-04-15 MED ORDER — LIDOCAINE HCL (PF) 1 % IJ SOLN
INTRAMUSCULAR | Status: DC | PRN
  Administered 2015-04-15: 9 mL via EPIDURAL
  Administered 2015-04-15 (×2): 8 mL via EPIDURAL
  Administered 2015-04-15: 9 mL via EPIDURAL

## 2015-04-15 MED ORDER — METHYLERGONOVINE MALEATE 0.2 MG/ML IJ SOLN: 0.2000 mg | INTRAMUSCULAR | Status: DC | PRN

## 2015-04-15 MED ORDER — ONDANSETRON HCL 4 MG PO TABS: 4.0000 mg | ORAL_TABLET | ORAL | Status: DC | PRN

## 2015-04-15 MED ORDER — PNEUMOCOCCAL VAC POLYVALENT 25 MCG/0.5ML IJ INJ
0.5000 mL | INJECTION | INTRAMUSCULAR | Status: DC
  Filled 2015-04-15: qty 0.5

## 2015-04-15 MED ORDER — OXYTOCIN 40 UNITS IN LACTATED RINGERS INFUSION - SIMPLE MED: 62.5000 mL/h | INTRAVENOUS | Status: DC | PRN

## 2015-04-15 MED ORDER — DIPHENHYDRAMINE HCL 25 MG PO CAPS: 25.0000 mg | ORAL_CAPSULE | Freq: Four times a day (QID) | ORAL | Status: DC | PRN

## 2015-04-15 MED ORDER — SENNOSIDES-DOCUSATE SODIUM 8.6-50 MG PO TABS
2.0000 | ORAL_TABLET | ORAL | Status: DC
  Administered 2015-04-15 – 2015-04-16 (×2): 2 via ORAL
  Filled 2015-04-15 (×2): qty 2

## 2015-04-15 MED ORDER — LIDOCAINE-EPINEPHRINE (PF) 2 %-1:200000 IJ SOLN
INTRAMUSCULAR | Status: DC | PRN
  Administered 2015-04-15: 3 mL via EPIDURAL
  Administered 2015-04-15: 5 mL via EPIDURAL
  Administered 2015-04-15: 2 mL via EPIDURAL

## 2015-04-15 MED ORDER — BENZOCAINE-MENTHOL 20-0.5 % EX AERO
1.0000 "application " | INHALATION_SPRAY | CUTANEOUS | Status: DC | PRN
  Administered 2015-04-15: 1 via TOPICAL
  Filled 2015-04-15 (×2): qty 56

## 2015-04-15 MED ORDER — ONDANSETRON HCL 4 MG/2ML IJ SOLN: 4.0000 mg | INTRAMUSCULAR | Status: DC | PRN

## 2015-04-15 MED ORDER — LANOLIN HYDROUS EX OINT: TOPICAL_OINTMENT | CUTANEOUS | Status: DC | PRN

## 2015-04-15 MED ORDER — PRENATAL MULTIVITAMIN CH
1.0000 | ORAL_TABLET | Freq: Every day | ORAL | Status: DC
  Administered 2015-04-15 – 2015-04-17 (×3): 1 via ORAL
  Filled 2015-04-15 (×3): qty 1

## 2015-04-15 MED ORDER — OXYCODONE-ACETAMINOPHEN 5-325 MG PO TABS
1.0000 | ORAL_TABLET | ORAL | Status: DC | PRN
  Administered 2015-04-15 – 2015-04-17 (×6): 1 via ORAL
  Filled 2015-04-15 (×5): qty 1

## 2015-04-15 MED ORDER — ZOLPIDEM TARTRATE 5 MG PO TABS: 5.0000 mg | ORAL_TABLET | Freq: Every evening | ORAL | Status: DC | PRN

## 2015-04-15 NOTE — Anesthesia Preprocedure Evaluation (Signed)
Anesthesia Evaluation  Patient identified by MRN, date of birth, ID band Patient awake    Reviewed: Allergy & Precautions, H&P , NPO status , Patient's Chart, lab work & pertinent test results  Airway Mallampati: II  TM Distance: >3 FB Neck ROM: full    Dental no notable dental hx.    Pulmonary former smoker,    Pulmonary exam normal       Cardiovascular negative cardio ROS Normal cardiovascular exam    Neuro/Psych    GI/Hepatic negative GI ROS, Neg liver ROS,   Endo/Other  diabetesMorbid obesity  Renal/GU negative Renal ROS     Musculoskeletal   Abdominal (+) + obese,   Peds  Hematology negative hematology ROS (+)   Anesthesia Other Findings   Reproductive/Obstetrics (+) Pregnancy                             Anesthesia Physical Anesthesia Plan  ASA: III  Anesthesia Plan: Epidural   Post-op Pain Management:    Induction:   Airway Management Planned:   Additional Equipment:   Intra-op Plan:   Post-operative Plan:   Informed Consent: I have reviewed the patients History and Physical, chart, labs and discussed the procedure including the risks, benefits and alternatives for the proposed anesthesia with the patient or authorized representative who has indicated his/her understanding and acceptance.     Plan Discussed with:   Anesthesia Plan Comments:         Anesthesia Quick Evaluation

## 2015-04-15 NOTE — Progress Notes (Signed)
Delivery of live viable female by R. Denney CNM. APGARS 9,9

## 2015-04-15 NOTE — Anesthesia Procedure Notes (Addendum)
Epidural Patient location during procedure: OB Start time: 04/15/2015 1:59 AM End time: 04/15/2015 2:08 AM  Staffing Anesthesiologist: Lyn Hollingshead Performed by: anesthesiologist   Preanesthetic Checklist Completed: patient identified, surgical consent, pre-op evaluation, timeout performed, IV checked, risks and benefits discussed and monitors and equipment checked  Epidural Patient position: sitting Prep: site prepped and draped and DuraPrep Patient monitoring: continuous pulse ox and blood pressure Approach: midline Location: L3-L4 Injection technique: LOR air  Needle:  Needle type: Tuohy  Needle gauge: 17 G Needle length: 9 cm and 9 Needle insertion depth: 9 cm Catheter type: closed end flexible Catheter size: 19 Gauge Catheter at skin depth: 14 cm Test dose: negative and Other  Assessment Sensory level: T9 Events: blood not aspirated, injection not painful, no injection resistance, negative IV test and no paresthesia  Additional Notes Reason for block:procedure for pain  Epidural Patient location during procedure: OB Start time: 04/15/2015 4:30 AM End time: 04/15/2015 4:34 AM  Staffing Anesthesiologist: Lyn Hollingshead Performed by: anesthesiologist   Preanesthetic Checklist Completed: patient identified, surgical consent, pre-op evaluation, timeout performed, IV checked, risks and benefits discussed and monitors and equipment checked  Epidural Patient position: sitting Prep: site prepped and draped and DuraPrep Patient monitoring: continuous pulse ox and blood pressure Approach: midline Location: L2-L3 Injection technique: LOR air  Needle:  Needle type: Tuohy  Needle gauge: 17 G Needle length: 9 cm and 9 Needle insertion depth: 7 cm Catheter type: closed end flexible Catheter size: 19 Gauge Catheter at skin depth: 12 cm Test dose: negative and Other  Assessment Sensory level: T8 Events: blood not aspirated, injection not painful, no  injection resistance, negative IV test and no paresthesia  Additional Notes Reason for block:procedure for pain

## 2015-04-16 LAB — CBC
HCT: 24.6 % — ABNORMAL LOW (ref 36.0–46.0)
Hemoglobin: 7.5 g/dL — ABNORMAL LOW (ref 12.0–15.0)
MCH: 25.1 pg — AB (ref 26.0–34.0)
MCHC: 30.5 g/dL (ref 30.0–36.0)
MCV: 82.3 fL (ref 78.0–100.0)
Platelets: 347 10*3/uL (ref 150–400)
RBC: 2.99 MIL/uL — ABNORMAL LOW (ref 3.87–5.11)
RDW: 17 % — ABNORMAL HIGH (ref 11.5–15.5)
WBC: 16.1 10*3/uL — ABNORMAL HIGH (ref 4.0–10.5)

## 2015-04-16 MED ORDER — FERROUS SULFATE 325 (65 FE) MG PO TABS
325.0000 mg | ORAL_TABLET | Freq: Three times a day (TID) | ORAL | Status: DC
  Administered 2015-04-16 – 2015-04-17 (×3): 325 mg via ORAL
  Filled 2015-04-16 (×3): qty 1

## 2015-04-16 NOTE — Anesthesia Postprocedure Evaluation (Signed)
  Anesthesia Post-op Note  Patient: Katherine Douglas  Procedure(s) Performed: * No procedures listed *  Patient Location: Mother/Baby  Anesthesia Type:Epidural  Level of Consciousness: awake, alert , oriented and patient cooperative  Airway and Oxygen Therapy: Patient Spontanous Breathing  Post-op Pain: none  Post-op Assessment: Post-op Vital signs reviewed, Patient's Cardiovascular Status Stable, Respiratory Function Stable, Patent Airway, No headache, No backache and Patient able to bend at knees              Post-op Vital Signs: Reviewed and stable  Last Vitals:  Filed Vitals:   04/16/15 0539  BP: 117/56  Pulse: 71  Temp: 37.1 C  Resp: 18    Complications: No apparent anesthesia complications

## 2015-04-16 NOTE — Progress Notes (Signed)
CPS case has been assigned to [facility] CPS.  CSW received a call from Ms. Grandville Silos inquiring about discharge date and room phone number.  CSW provided information.

## 2015-04-16 NOTE — Clinical Social Work Maternal (Signed)
CLINICAL SOCIAL WORK MATERNAL/CHILD NOTE  Patient Details  Name: BERTA DENSON MRN: [id number] Date of Birth: [birth year]  Date:  04/16/2015  Clinical Social Worker Initiating Note:  Genova Kiner E. Brigitte Pulse, [place] Date/ Time Initiated:  04/16/15/1100     Child's Name:  Jocelyn Lamer   Legal Guardian:  Mother  FOB: Brenton Grills Murphy-not involved  Need for Interpreter:  None   Date of Referral:  04/15/15     Reason for Referral:  Behavioral Health Issues, including SI , Current Substance Use/Substance Use During Pregnancy    Referral Source:  [facility]   Address:  [address], [place], [place] [address]  Phone number:  [phone]   Household Members:  Minor Children (MOB has two other children: daughter-age 423 and son-age 42)   Natural Supports (not living in the home):  Extended Family, Immediate Family   Professional Supports: Transport planner (MOB receives counseling services through the [facility] and Music therapist)   Employment:     Type of Work:     Education:      Museum/gallery curator Resources:  [employer]   Other Resources:      Cultural/Religious Considerations Which May Impact Care: None stated  Strengths:  Ability to meet basic needs , Home prepared for child    Risk Factors/Current Problems:  Mental Health Concerns , Substance Use    Cognitive State:  Alert , Goal Oriented , Insightful , Linear Thinking    Mood/Affect:  Relaxed , Calm , Comfortable , Interested    CSW Assessment: CSW met with MOB in her first floor room to complete assessment due to hx of mental health concerns including a suicide attempt one year ago and marijuana use during pregnancy.  MOB's 34 year old daughter was in the room with her and CSW offered to return at a later time, explaining that CSW would prefer to meet with MOB privately.  MOB informed CSW that her daughter will be here later and insisted that we could talk about anything with her present. CSW inquired  about how MOB is feeling emotional at this time as well as throughout her pregnancy.  MOB was very open with CSW and explained that she went through a very difficult depression about a year ago.  She reports that for the past 3 months, she is "feeling fine."  She states she had experienced job loss, loss in housing and had lost her car, but that "everything came together."  She reports she now has stable housing again and has gotten her car back.  She reports being able to think positively at this time in her life and identify things to be thankful for.  She reports being in counseling at the [facility], which she finds very beneficial.  She is also linked with [employer].  She states they are "supportive."  She reports good supports through her family as well, although states FOB is not supportive.  She is not taking any medication for her depression or anxiety since she found out she was pregnant around the time she was being evaluated by the psychiatrist.  She states she wants to see how things go with continuing therapy and states she can talk with the psychiatrist at the counseling office if she feels she needs more support.  CSW commends her for her plan.  CSW discussed common emotions after delivery as well as provided education on signs and symptoms of perinatal mood disorders.  MOB was engaged and attentive and commits to talking with her  therapist if she has concerns about her emotions at any time.  She reports no SI at this time.   CSW informed MOB of hospital drug screen policy and asked about marijuana use documented in her medical record.  MOB states she was diagnosed with pneumonia around the time she found out she was pregnant and quit smoking cigarettes and marijuana at that time.  She states that she experienced poor appetite and nausea throughout the pregnancy and that FOB "handed me a blunt," which she found helped with these problems.  She states she was up front about  this with her OB.  CSW informed MOB that baby's UDS is positive and therefore CSW is mandated to make a report to Designer, jewellery.  MOB states she assumed baby would be tested and come back positive and that she is not concerned about the report being made.  She states she had an open case until May of this year due to her suicide attempt (with daughter present) last year.  CSW explained that a worker from [facility] may come to the hospital to speak with her prior to discharge, or may wait until they get home from the hospital.  CSW explained that a positive marijuana screen will not delay discharge.  MOB stated understanding.   MOB seemed appreciative of the visit with CSW and support offered.  She states no questions, concerns or needs at this time.  Report made to [facility].  CSW Plan/Description:  Engineer, mining , Child Protective Service Report , No Further Intervention Required/No Barriers to Discharge    Kalman Shan 04/16/2015, 12:54 PM

## 2015-04-16 NOTE — Progress Notes (Signed)
Post Partum Day #1 Subjective: no complaints, up ad lib, voiding and tolerating PO  Objective: Blood pressure 117/56, pulse 71, temperature 98.8 F (37.1 C), temperature source Oral, resp. rate 18, height 5\' 2"  (1.575 m), weight 250 lb (113.399 kg), last menstrual period 07/14/2014, SpO2 100 %, unknown if currently breastfeeding.  Physical Exam:  General: alert, cooperative and no distress Lochia: appropriate Uterine Fundus: firm Incision: healing well DVT Evaluation: No evidence of DVT seen on physical exam. Negative Homan's sign. No cords or calf tenderness. No significant calf/ankle edema. Positive Homan's sign.   Recent Labs  04/14/15 0754 04/16/15 0540  HGB 9.1* 7.5*  HCT 28.6* 24.6*    Assessment/Plan: Plan for discharge tomorrow, Breastfeeding and Lactation consult   LOS: 2 days   Keyanna Sandefer A Fillmore Bynum 04/16/2015, 9:39 AM

## 2015-04-17 LAB — CBC WITH DIFFERENTIAL/PLATELET
Basophils Absolute: 0 10*3/uL (ref 0.0–0.1)
Basophils Relative: 0 % (ref 0–1)
Eosinophils Absolute: 0.3 10*3/uL (ref 0.0–0.7)
Eosinophils Relative: 3 % (ref 0–5)
HEMATOCRIT: 25.2 % — AB (ref 36.0–46.0)
Hemoglobin: 8 g/dL — ABNORMAL LOW (ref 12.0–15.0)
LYMPHS PCT: 30 % (ref 12–46)
Lymphs Abs: 3.1 10*3/uL (ref 0.7–4.0)
MCH: 26.1 pg (ref 26.0–34.0)
MCHC: 31.7 g/dL (ref 30.0–36.0)
MCV: 82.1 fL (ref 78.0–100.0)
MONO ABS: 0.7 10*3/uL (ref 0.1–1.0)
Monocytes Relative: 7 % (ref 3–12)
NEUTROS ABS: 6.1 10*3/uL (ref 1.7–7.7)
Neutrophils Relative %: 60 % (ref 43–77)
Platelets: 381 10*3/uL (ref 150–400)
RBC: 3.07 MIL/uL — ABNORMAL LOW (ref 3.87–5.11)
RDW: 16.9 % — ABNORMAL HIGH (ref 11.5–15.5)
WBC: 10.3 10*3/uL (ref 4.0–10.5)

## 2015-04-17 MED ORDER — IBUPROFEN 800 MG PO TABS: 800.0000 mg | ORAL_TABLET | Freq: Three times a day (TID) | ORAL | Status: DC | PRN

## 2015-04-17 MED ORDER — PNEUMOCOCCAL VAC POLYVALENT 25 MCG/0.5ML IJ INJ: 0.5000 mL | INJECTION | INTRAMUSCULAR | Status: DC

## 2015-04-17 MED ORDER — WITCH HAZEL-GLYCERIN EX PADS: 1.0000 "application " | MEDICATED_PAD | CUTANEOUS | Status: DC | PRN

## 2015-04-17 MED ORDER — BENZOCAINE-MENTHOL 20-0.5 % EX AERO: 1.0000 "application " | INHALATION_SPRAY | CUTANEOUS | Status: DC | PRN

## 2015-04-17 MED ORDER — OXYCODONE-ACETAMINOPHEN 5-325 MG PO TABS: 2.0000 | ORAL_TABLET | ORAL | Status: DC | PRN

## 2015-04-17 MED ORDER — IBUPROFEN 600 MG PO TABS: 600.0000 mg | ORAL_TABLET | Freq: Four times a day (QID) | ORAL | Status: DC

## 2015-04-17 MED ORDER — PNEUMOCOCCAL VAC POLYVALENT 25 MCG/0.5ML IJ INJ
0.5000 mL | INJECTION | INTRAMUSCULAR | Status: AC | PRN
  Administered 2015-04-17: 0.5 mL via INTRAMUSCULAR
  Filled 2015-04-17: qty 0.5

## 2015-04-17 MED ORDER — FERIVA 21/7 75-1 MG PO TABS: 1.0000 | ORAL_TABLET | Freq: Every day | ORAL | Status: DC

## 2015-04-17 MED ORDER — SENNOSIDES-DOCUSATE SODIUM 8.6-50 MG PO TABS: 2.0000 | ORAL_TABLET | Freq: Every evening | ORAL | Status: DC | PRN

## 2015-04-17 MED ORDER — LANOLIN HYDROUS EX OINT: 1.0000 "application " | TOPICAL_OINTMENT | CUTANEOUS | Status: DC | PRN

## 2015-04-17 MED ORDER — DIBUCAINE 1 % RE OINT: 1.0000 "application " | TOPICAL_OINTMENT | RECTAL | Status: DC | PRN

## 2015-04-17 NOTE — Discharge Summary (Signed)
Obstetric Discharge Summary Reason for Admission: onset of labor Prenatal Procedures: ultrasound Intrapartum Procedures: spontaneous vaginal delivery Postpartum Procedures: none Complications-Operative and Postpartum: no  Physical Exam:  General: alert and no distress Lochia: appropriate Uterine: firm Incision: None DVT Evaluation: No evidence of DVT seen on physical exam.  Discharge Diagnoses: Normal Delivery  Discharge Information:  Discharged home in good condition. Date: 08-25-13 Activity: pelvic rest Diet: routine Medications: Ibuprofen, PNV, Percocet Condition: stable Instructions: refer to routine discharge instructions Discharge to: home Follow up in office in 2 weeks.  Newborn Data:   Home with mother.

## 2015-04-17 NOTE — Lactation Note (Signed)
This note was copied from the chart of Katherine Douglas. Lactation Consultation Note Mom has 2 older children that she stated she attempted to BF but they wouldn't latch. So she pumped and bottle fed for 2 months then became frustrated and totally formula fed. She wants to mainly breast feed this baby, will occasionally do formula. Spoke to her about being + for MJ and shouldn't smoke while BF. Mom stated she didn't smoke it she ate it, mainly put it between her cheek and under her tongue to prevent nausea. Stated she told Dr. Jodi Mourning and he said it was fine. Mom is mad for hospital to call CPS and make a big deal out of it. Explained the reasons why we didn't want the baby exposed to [employer]. Mom stated she wouldn't need it any longer since she's not pregnant and not nauseated. Mom requested a hand pump, reported to tech.  Referred to Baby and Me Book in Breastfeeding section Pg. 22-23 for position options and Proper latch demonstration.Mom encouraged to feed baby 8-12 times/24 hours and with feeding cues. Educated about newborn behavior. Mom reports + breast changes w/pregnancy. Hand expression taught to Mom. [place] brochure given w/resources, support groups and [place] services.  Patient Name: Katherine Douglas [id number] Date: 04/17/2015 Reason for consult: Initial assessment   Maternal Data Has patient been taught Hand Expression?: Yes Does the patient have breastfeeding experience prior to this delivery?: Yes  Feeding    LATCH Score/Interventions       Type of Nipple: Everted at rest and after stimulation  Comfort (Breast/Nipple): Soft / non-tender     Intervention(s): Breastfeeding basics reviewed;Support Pillows;Position options;Skin to skin     Lactation Tools Discussed/Used     Consult Status Consult Status: Complete    Breea Loncar G 04/17/2015, 7:21 AM

## 2015-04-17 NOTE — Discharge Instructions (Signed)
Call [facility] to schedule your 2 week postpartum visit.    Postpartum Care After Vaginal Delivery After you deliver your baby, you will stay in the hospital for 24 to 72 hours, unless there were problems with the labor or delivery, or you have medical problems. While you are in the hospital, you will receive help and instructions on how to care for yourself and your baby. Your doctor will order pain medicine, in case you need it. You will have a small amount of bleeding from your vagina and should change your sanitary pad frequently. Wash your hands thoroughly with soap and water for at least 20 seconds after changing pads and using the toilet. Let the nurses know if you begin to pass blood clots or your bleeding increases. Do not flush blood clots down the toilet before having the nurse look at them, to make sure there is no placental tissue with them. If you had an intravenous, it will be removed within 24 hours, if there are no problems. The first time you get out of bed or take a shower, call the nurse to help you because you may get weak, lightheaded, or even faint. If you are breastfeeding, you may feel painful contractions of your uterus for a couple of weeks. This is normal. The contractions help your uterus get back to normal size. If you are not breastfeeding, wear a tight, binding bra and decrease your fluid intake. You may be given a medicine to dry up the milk in your breasts. Hormones should not be given to dry up the breasts, because they can cause blood clots. You will be given your normal diet, unless you have diabetes or other medical problems.  The nurses may put an ice pack on your episiotomy (surgically enlarged opening), if you have one, to reduce the pain and puffiness (swelling). On rare occasions, you may not be able to urinate and the nurse will need to empty your bladder with a catheter. If you had a postpartum tubal ligation ("tying tubes," female sterilization), it should not make your  stay in the hospital longer. You may have your baby in your room with you as much as you like, unless you or the baby has a problem. Use the bassinet (basket) for the baby when going to and from the nursery. Do not carry the baby. Do not leave the postpartum area. If the mother is Rh negative (lacks a protein on the red blood cells) and the baby is Rh positive, the mother should get a Rho-gam shot to prevent Rh problems with future pregnancies. You may be given written instructions for you and your baby, and necessary medicines, when you are discharged from the hospital. Be sure you understand and follow the instructions as advised. HOME CARE INSTRUCTIONS AFTER YOUR DELIVERY:  Follow instructions and take the medicines given to you.   Only take over-the-counter or prescription medicines for pain, discomfort, or fever as directed by your caregiver.   Do not take aspirin, because it can cause bleeding.   Increase your activities a little bit every day to build up your strength and endurance.   Do not drink alcohol, especially if you are breastfeeding or taking pain medicine.   Take your temperature twice a day and record it.   You may have a small amount of bleeding or spotting for 2 to 4 weeks. This is normal.   Do not use tampons or douche. Use sanitary pads.   Try to have someone stay and  help you for a few days when you go home.   Try to rest or take a nap when the baby is sleeping.   If you are breastfeeding, wear a good support bra. If you are not breastfeeding, wear a tight bra, do not stimulate your nipples, and decrease your fluid intake.   Eat a healthy, nutritious diet and continue to take your prenatal vitamins.   Do not drive, do any heavy activities or travel until your caregiver tells you it is OK.   Do not have intercourse until your caregiver gives you permission to do so.   Ask your caregiver when you can begin to exercise and what type of exercises to do.   Call  your caregiver if you think you are having a problem from your delivery.   Call your pediatrician if you are having a problem with the baby.   Schedule your postpartum visit and keep it.  SEEK MEDICAL CARE IF:  You have a temperature of 100 F (37.8 C) or higher.   You have increased vaginal bleeding or are passing clots. Save any clots to show your caregiver.   You have bloody urine, or pain when you urinate.   You have a bad smelling vaginal discharge.   You have increasing pain or swelling on your episiotomy (surgically enlarged opening).   You develop a severe headache.   You feel depressed.   The episiotomy is separating.   You become dizzy or lightheaded.   You develop a rash.   You have a reaction or problems with your medicine.   You have pain, redness, and/or swelling at the intravenous site.  SEEK IMMEDIATE MEDICAL CARE IF:  You have chest pain.   You develop shortness of breath.   You pass out.   You develop pain, with or without swelling or redness in your leg.   You develop heavy vaginal bleeding, with or without blood clots.   You develop stomach pain.   You develop a bad smelling vaginal discharge.  MAKE SURE YOU:   Understand these instructions.   Will watch your condition.   Will get help right away if you are not doing well or get worse.  Document Released: 07/10/2007 Document Re-Released: 08/31/2009 [facility] Patient Information 2011 [place].Patient information: Common breastfeeding problems (Beyond the Basics)  Author Estevan Ryder, MD Section Editor Reynold Bowen, MD Deputy Editors Inocencio Homes, MD Chalmers Guest, PhD Disclosures: Estevan Ryder, MD Nothing to disclose. Reynold Bowen, MD Grant/Research/Clinical Trial Support: [employer] (infant nutrition [specialized formulas for preterm infants]). Inocencio Homes, MD Employee of [employer] Chalmers Guest, PhD Employee of [employer]  disclosures are reviewed for conflicts of interest by the editorial group. When found, these are addressed by vetting through a multi-level review process, and through requirements for references to be provided to support the content. Appropriately referenced content is required of all authors and must conform to [employer] standards of evidence.  Conflict of interest policy  All topics are updated as new evidence becomes available and our peer review process is complete.  Literature review current through: Apr 2014.   This topic last updated: Jan 21, 2013.  BREASTFEEDING PROBLEMS OVERVIEW -- Breastfeeding is universally recognized as the best way to feed an infant because it protects mother and infant from a variety of health problems. Even so, many women who start out breastfeeding stop before the recommended minimum of exclusive breastfeeding for six months. Often women stop because common  problems interfere with their ability to breastfeed. Luckily, with sound guidance and appropriate medical treatment, most women can overcome these obstacles and continue breastfeeding for longer periods. This topic discusses common problems associated with breastfeeding and how to handle them. Other aspects of breastfeeding are discussed elsewhere. (See "Patient information: Deciding to breastfeed (Beyond the Basics)" and "Patient information: Breastfeeding guide (Beyond the Basics)" and "Patient information: Maternal health and nutrition during breastfeeding (Beyond the Basics)" and "Patient information: Breast pumps (Beyond the Basics)".) INADEQUATE MILK INTAKE -- The most common reason women stop breastfeeding is that they think their infant is not getting enough milk, but in many cases the mother has an adequate supply. A true inadequate supply can happen if the infant is unable to extract milk well or if the mother doesnt make enough milk. Unfortunately, figuring out if a mother has enough milk and if not, why not,  can be challenging. Inadequate milk production -- There are a number of reasons why a mother might not make enough milk, including that: ?Her breasts did not develop sufficiently during pregnancy - This can happen if she doesnt have enough milk-producing tissue (called glandular tissue) (figure 1). ?She previously had breast surgery or radiation treatment. ?She has a hormonal imbalance. ?She takes certain medications that interfere with milk production. Women who have had breast surgery, such a breast augmentation or breast reduction surgery, often have trouble making enough milk. For some, breastfeeding is impossible. If you had breast surgery, ask your healthcare provider if the type of surgery you had would totally interfere with breastfeeding. If not, or if you are unable to get complete information on your surgery, do go ahead and try, but make sure your healthcare provider closely monitors your babys progress. Poor milk extraction -- The most common reasons infants have trouble getting enough milk are: ?They do not get fed frequently enough (which can cause milk production to slow or stop) ?They cannot latch on properly (figure 2) ("A Perfect Latch" video; for a good demonstration of latch, place the timer of the video on minute 7:43) ?They are separated from their mother too much ?They are fed formula Many babies are sleepy and difficult to keep awake during the first several days after birth. This can prevent the baby from getting enough to eat. Other babies can have trouble controlling the muscles involved in suckling, which makes it hard for them to extract milk. Feeding difficulty is especially common among premature and late preterm babies. Many mothers judge adequacy of feeding by lack of crying. This can be misleading if the baby is not getting enough milk and is overly sleepy. Diagnosis of inadequate intake -- Healthcare providers determine whether a baby is getting enough milk based on  the following: ?Number of feeding sessions the mother reports having - During the first week of life, mothers with term infants (meaning they are not premature) generally nurse 8 to 12 times in 24 hours. By four weeks after delivery, nursing usually decreases to seven to nine times per day. ?Amount of urine and stool the baby makes - By the fifth day of life, infants who are getting enough milk urinate six to eight times a day and have three or more stools a day. (Once a mothers milk comes in, her infants stool should be pale yellow and seedy.) ?Weight of the baby - Term infants lose an average of 7 percent of their birth weight in the first three to five days of life. They typically get back  to their birth weight within one to two weeks. Once a mother's breasts fill with milk - by day three to five - her infant should not keep losing weight. If an infant has lost 10 percent of its weight or fails to return to its birth weight when expected, healthcare providers start to explore potential problems. Household scales are not accurate enough to detect these small weight differences. If you are using a medical scale for infants, remember to weigh the infant with the same clothes and diaper before and after the feeding. Management of inadequate intake -- If your healthcare provider suspects your baby is not getting enough milk, he or she will want to figure out why. To do that, the healthcare provider will ask you about your experiences breastfeeding and about your and your babys medical history. A healthcare provider should also watch as you try to breastfeed to see if there could be something wrong with the way your baby latches on or with the babys mouth. If so, it will be important for you to learn how to position your baby so that the baby can latch on properly (figure 2 and figure 3). If you are having trouble with this, the healthcare provider will direct you to community resources ? often a Environmental consultant ? for assistance. If your baby has a good latch, but you still have problems with inadequate milk intake, your healthcare provider might suggest that you try to feed more often or try to stimulate more milk production by using a breast pump or expressing by hand (figure 4) ("Hand Expression of Breastmilk" video). There are medications called galactagogues (or lactagogues) that supposedly increase milk production, but its unclear whether these medications actually work and whether they are safe for a nursing baby, so we do not recommend their use. NIPPLE AND BREAST PAIN -- The second most common reason mothers stop breastfeeding early is nipple or breast pain. The causes of nipple and breast pain include: ?Nipple injury (caused by the baby or a breast pump) ?Engorgement, which means the breasts get overly full ?Plugged milk ducts ?Nipple and breast infections ?Excessive milk supply ?Skin disorders (such as dermatitis or psoriasis) affecting the nipple ?Nipple vasoconstriction, which means the blood vessels in the nipple tighten and do not let enough blood through Possible causes of breast or nipple pain related to the baby could include: ?Ankyloglossia (also called tongue-tie), which is when the babys tongue cannot move as freely as it should, making it hard for the baby to suckle effectively ?Torticollis, which is when the babys neck is twisted, making it hard for the baby to nurse from both breasts comfortably ?Birth defects in the shape of the babys mouth that make it hard for the baby to latch on ?Uncoordinated suck, which is when the baby does not move its tongue in the correct rhythm to extract milk To determine the cause of your pain, your healthcare provider will examine you and your baby, and watch you breastfeed. He or she will also ask about your pain (when it started, what makes it better or worse), and about aspects of your health that could hold clues about the cause of  your pain. The most important part of the exam takes place when the healthcare provider watches you breastfeed. Thats because most cases of breast pain in the nursing mother are due to incorrect breastfeeding technique. One common problem is that the baby is not latching on properly, and so injures the nipple, but also  cannot empty the breast. This, in turn, can lead to engorgement, plugged ducts, and breast infections. Nipple pain -- Sore nipples are one of the most common complaints by new mothers. Pain due to nipple injury needs to be distinguished from nipple sensitivity, which normally increases during pregnancy and peaks about four days after giving birth. You can usually tell the difference between normal nipple sensitivity and pain caused by nipple injury based on when it happens and how it changes over time. Normal sensitivity typically subsides 30 seconds after suckling begins. It also diminishes on the fourth day after giving birth and completely resolves when the baby is about a week old. Nipple pain caused by trauma, on the other hand, persists or gets worse after suckling begins. Severe pain or pain that continues after the first week after birth is more likely to be due to nipple injury. Normal nipple sensitivity -- If you have some discomfort related to normal nipple sensitivity, keep in mind that this sensitivity usually goes away after the first few suckles of a feeding, and stops happening after the first week or two of nursing. If you find the pins and needles sensation of milk let-down to be uncomfortable, rest assured this discomfort also resolves in the first weeks of breastfeeding. If needed, you can take acetaminophen (sample brand name: Tylenol) to ease your discomfort. Nipple injury -- Nipple injury usually is due to incorrect breastfeeding technique, particularly poor position or latch-on. Other factors that can make pain caused by injury worse include harsh breast cleansing, use  of potentially irritating products, and biting by an older infant. Here are some things you can do to prevent nipple injury: ?Learn how to position your baby so that the baby can latch on properly (figure 2 and figure 3). If you are having trouble with this, get help from a healthcare provider or a Science writer. ?Try to keep your nipples dry, and allow them to air-dry after feedings. ?Do not use harsh soaps or cleansers on your breasts. ?If your babys mouth has any abnormalities, make sure to have them addressed as soon as possible. For example, if your baby has tongue-tie, surgery to release the tongue will make it easier for the baby to latch on properly. ?If your baby is biting you, position the baby so that his or her mouth is wide open during feedings. That will make it harder to bite. Also, stick your finger between your nipple and the babys mouth any time he or she bites you and firmly say no. Then put the baby down in a safe place. The baby will learn not to bite you. Here are some things you can do to promote healing if your nipples are already injured: ?Always start nursing with the breast that does not have the injury. ?If your nipples are cracked or raw, put an ointment on them, such as Vitamin A and D or purified lanolin (if you are not allergic) and cover them with a nonstick pad. This will help prevent nipple infection and keep the injured part of your nipple from sticking to your bra. If you think your nipple is infected or you have a rash, see your healthcare provider. ?Use cool or warm compresses, if they seem to help. Avoid ice. ?Take a mild pain reliever, such as acetaminophen (brand name Tylenol) or ibuprofen (sample brand names: Advil, Motrin), before feeding. ?If nipple pain prevents your baby from emptying your breasts, try using a pump or hand expression to empty your breasts.  This will give your nipples a chance to heal and prevent engorgement. Use the milk you  remove to feed your baby. ?Do NOT use vitamin E oil on your nipples. At high levels, it could be toxic to your baby. Nipple vasoconstriction -- Nipple vasoconstriction is when the blood vessels in the nipple tighten and do not let enough blood through. Mothers with this problem can have pain, burning, or numbness in their nipples in response to cold, nursing, or injury. The nipples can also turn white or blue, and then pink when the blood returns. One way to tell nipple vasoconstriction apart from other causes of nipple pain is that it can be predictably triggered by cold, while other causes of pain cannot. To manage nipple vasoconstriction, try to keep your whole body warm and dress warmly. Also, if possible, try to breastfeed in warm conditions. It might also help to avoid nicotine and caffeine, as they can make the problem worse. Engorgement -- Engorgement is the medical term for when the breasts get too full of milk. It can make your breast feel full and firm and can cause pain and tenderness. Engorgement can sometimes impair the babys ability to latch, which makes engorgement worse, because the baby cannot then empty the breast. Here are some things you can do to prevent and deal with engorgement: ?Learn how to position your baby so that the baby can latch on properly (figure 2 and figure 3). If you are having trouble with this, get help from a healthcare provider or a Science writer. ?If the engorgement makes it hard for your baby to latch on, manually express a small amount of milk before each feeding to soften your areola and make it easier for the baby to latch on (figure 4). To do this, place your thumb and forefingers well behind your areola (close to your chest) and then compress them together and toward your nipple in a rhythmic fashion. You can also use your hand to present your nipple in a way that is easier to latch and to help get milk out for the baby while the baby is  suckling. ?You can use a breast pump to help soften your breast before a feeding, but be careful not to do it too much. Using a pump too much will stimulate your breast to make even more milk, which will make engorgement worse. ?Apply warm compresses or take a warm shower before a feeding. This can enhance let-down and may make it easier to get milk out. ?Take a mild pain reliever, such as acetaminophen (brand name Tylenol) or ibuprofen (sample brand names: Advil, Motrin). Plugged ducts -- A plugged milk duct can cause a tender or painful lump to form on the breast. If the nipple itself is plugged, a white dot or bleb can form at the end of the nipple. Things that can lead to a plugged milk duct include poor feeding technique, wearing tight clothing or an ill-fitting bra, abrupt decrease in feeding, engorgement, and infections. Here are some things you can do to prevent and deal with a plugged duct: ?Learn how to position your baby so that the baby can latch on properly (figure 2 and figure 3). If you are having trouble with this, get help from a healthcare provider or a Science writer. Make sure to vary your position during feedings so every part of the breast gets emptied. You might even try to position the baby so that his or her chin is near the plugged area,  because this positioning can help drain that area best. You can also try pumping or manually expressing after feedings to improve drainage. Do not stop breastfeeding, as this could lead to engorgement and worsen the problem. ?Try using warm compresses or taking a warm shower and then manually massaging your breast from the outer part of your breast toward the nipple to advance the blockage toward the nipple or in the opposite direction to clear the duct. ?Take a mild pain reliever, such as acetaminophen (brand name Tylenol) or ibuprofen (sample brand names: Advil, Motrin). ?If your blockage does not get better within two days, see your  healthcare provider, as what appears to be a blocked duct may be something more concerning. Galactoceles -- Sometimes a blocked milk duct can cause a milk-filled cyst called a galactocele to form (picture 1). Unless they are infected, galactoceles are usually painless, but they can get quite large. If necessary, a healthcare provider can drain a galactocele using a needle, or suggest surgery if the problem is severe. BREAST INFECTIONS Lactational mastitis -- Mastitis is an inflammation of the breast that is often associated with fever (which might be masked by pain medications), muscle and breast pain, and redness. It is not always caused by an infection, but most people associate it with infection. Mastitis can happen at any time during lactation, but it is most common during the first six weeks after delivery. Mastitis tends to occur if the nipples are damaged or the breasts stay engorged for too long or do not drain properly. To prevent and treat mastitis, its important to get these problems under control. If you have any of the following symptoms, see your healthcare provider: ?A firm, red, and tender area of the breast ?Fever higher than 101?F or 38.5?C ?Muscle aches, chills, malaise, or flu-like symptoms If you do have an infection, your healthcare provider will probably put you on antibiotics. Here are some things you can do to manage mastitis: ?Take your antibiotics exactly as directed, if your healthcare provider prescribes them. If you dont start to feel better within two to three days of starting antibiotics, call your healthcare provider. You may need a different antibiotic or may have a different problem. ?Continue breastfeeding, even while you are being treated, and work on your feeding technique, so that your breasts empty well. ?Take a mild pain reliever, such as acetaminophen (sample brand name Tylenol) or ibuprofen (sample brand names: Advil, Motrin), if you think it could  help. ?Apply cold compresses or ice packs. Yeast infection -- Many women who are breastfeeding are diagnosed with a yeast infection of the nipple or breast (also called candidal infection) based on their symptoms (primarily nipple pain). Even so, yeast infections of the nipple or breast are poorly understood, and researchers arent sure what role they play in nipple pain. For now, healthcare providers often diagnose yeast infections in women who have: ?Breast pain out of proportion to any apparent cause ?A history of vaginal yeast infections or an infant with a history of yeast infections such as thrush or diaper rash ?Shiny or flaky skin on the affected nipple ?Skin scrapings from the nipple or areola that test positive for yeast (or breast milk that does) Treatments include: ?Topical antifungals - Topical antifungals are creams or gels that contain medications called antifungals, which kill yeast. Women using these agents must wipe any remaining medication they can see off their nipples before each feeding and reapply them after each feeding. Antifungal ointments are not recommended, as  the paraffins they contain could be harmful to the infant. ?Gentian violet - Gentian violet 0.25 to 1% is a purple medication that you apply to your babys mouth with a Q-tip before a feeding. After the feeding, you apply more gentian violet to any areas of your nipple and areola that are not already purple. You do this once a day for three to four days. Gentian violet is a bit messy and can sometimes irritate the babys mouth or the nipple area, but it is effective. ?Antifungal pills - Mothers who do not get better with the options described above can be put on prescription antifungal pills. They can continue to breastfeed while on these pills, as the typical amount of drug that makes it into breast milk is safe for breastfeeding infants. BLOODY NIPPLE DISCHARGE -- Some women have bloody nipple discharge during the  first days to weeks of lactation. This is more common with the first pregnancy and has been called rusty pipe syndrome. It is thought to be caused by the increased blood flow to the breasts and ducts that happens when the mother starts making milk. The color of the milk varies from pink to red and generally resolves within a few days. Women who have bloody discharge for more than a week should be seen by a healthcare provider. MILK OVERSUPPLY -- Some mothers make too much milk, which paradoxically can make breastfeeding difficult. Generally the production of milk is determined by the infant's demand, but in this case the supply exceeds demand. The problem begins early in lactation and is most common among women having their first child. In women with an oversupply of milk, the rush of the milk can be so strong that it causes the infant to choke and cough and have trouble feeding, or even to bite down to clamp the nipple. Infants whose mothers make too much milk can either gain weight quickly or gain too little weight because they cannot handle the flow of milk, or because they do not get the last of the milk in the breast, which has the most calories. If you have a problem with overproduction, dont worry. The problem usually goes away on its own. But tell your healthcare provider about it, so he or she can check whether you have any hormonal imbalances or take any medications that could make the problems worse. Here are some things you can do to deal with milk oversupply: ?Nurse in an upright position - Hold your baby upright to nurse and lean back or lie on your side (figure 3 and picture 2). This will give the baby better control of the flow of milk. ?Use your fingers to reduce the flow of milk - Try putting a scissors-hold on your areola or pressing on your breast with the heel of your hand to restrict flow. ?Give baby control - Let your baby interrupt feedings, and burp him or her often. ?Pump very  little or not at all - Avoid pumping, because it can stimulate even more milk production, but you can hand-pump a little at the beginning of a feeding to relieve some of the pressure. ?Apply cold water or ice to your nipples to decrease leaking WHEN TO SEEK HELP -- If you are unable to breastfeed due to engorgement, pain, or difficulty latching your infant, help is available. Talk to your obstetrical or pediatric healthcare provider, nurse, lactation consultant, or a breastfeeding counselor. (See 'Finding a lactation consultant' below.) [place] -- Your healthcare  provider is the best source of information for questions and concerns related to your medical problem.

## 2015-04-17 NOTE — Progress Notes (Signed)
Post Partum Day #2 Subjective: no complaints, up ad lib, voiding, tolerating PO and + flatus  Objective: Blood pressure 105/65, pulse 72, temperature 98.5 F (36.9 C), temperature source Oral, resp. rate 20, height 5\' 2"  (1.575 m), weight 250 lb (113.399 kg), last menstrual period 07/14/2014, SpO2 100 %, unknown if currently breastfeeding.  Physical Exam:  General: alert, cooperative, no distress and morbidly obese Lochia: appropriate Uterine Fundus: firm Incision: healing well DVT Evaluation: No evidence of DVT seen on physical exam. Negative Homan's sign. No cords or calf tenderness. No significant calf/ankle edema.   Recent Labs  04/16/15 0540 04/17/15 0520  HGB 7.5* 8.0*  HCT 24.6* 25.2*    Assessment/Plan: Discharge home, Breastfeeding and Lactation consult   LOS: 3 days   Katherine Douglas A Aadil Sur 04/17/2015, 8:40 AM

## 2015-04-22 ENCOUNTER — Encounter: Payer: Medicaid Other | Admitting: Obstetrics

## 2015-04-22 ENCOUNTER — Ambulatory Visit: Payer: Medicaid Other | Admitting: Obstetrics

## 2015-05-01 ENCOUNTER — Telehealth: Payer: Self-pay | Admitting: *Deleted

## 2015-05-01 NOTE — Telephone Encounter (Signed)
Patient contacted the office stating she had delivered on 04-15-15 vaginally and was given ibuprofen and percocet when leaving the hospital. Patient states the percocet is making her nauseous and feel weird. Patient requesting a prescriptions for something different. Patient advised that at this point after a vaginal delivery the providers advise ibuprofen for pain as well as comfort measures. Patient states ibuprofen just isn't helping. Patient encouraged to keep her appointment on May 04, 2015. Patient was upset and hung up the phone.

## 2015-05-04 ENCOUNTER — Ambulatory Visit: Payer: Medicaid Other | Admitting: Obstetrics

## 2015-05-05 ENCOUNTER — Ambulatory Visit: Payer: Medicaid Other | Admitting: Obstetrics

## 2015-05-15 ENCOUNTER — Ambulatory Visit (INDEPENDENT_AMBULATORY_CARE_PROVIDER_SITE_OTHER): Payer: Medicaid Other | Admitting: Obstetrics

## 2015-05-15 ENCOUNTER — Encounter: Payer: Self-pay | Admitting: Obstetrics

## 2015-05-15 DIAGNOSIS — O9089 Other complications of the puerperium, not elsewhere classified: Secondary | ICD-10-CM

## 2015-05-15 DIAGNOSIS — Z30013 Encounter for initial prescription of injectable contraceptive: Secondary | ICD-10-CM

## 2015-05-15 DIAGNOSIS — R52 Pain, unspecified: Secondary | ICD-10-CM

## 2015-05-15 MED ORDER — OXYCODONE HCL 10 MG PO TABS: 10.0000 mg | ORAL_TABLET | Freq: Four times a day (QID) | ORAL | Status: DC | PRN

## 2015-05-15 MED ORDER — MEDROXYPROGESTERONE ACETATE 150 MG/ML IM SUSP: 150.0000 mg | INTRAMUSCULAR | Status: DC

## 2015-05-15 NOTE — Progress Notes (Signed)
Subjective:     Katherine Douglas is a 34 y.o. female who presents for a postpartum visit. She is 4 weeks postpartum following a spontaneous vaginal delivery. I have fully reviewed the prenatal and intrapartum course. The delivery was at 23 gestational weeks. Outcome: spontaneous vaginal delivery. Anesthesia: epidural. Postpartum course has been normal. Baby's course has been normal. Baby is feeding by bottle - Similac Advance. Bleeding no bleeding. Bowel function is normal. Bladder function is normal. Patient is not sexually active. Contraception method is abstinence. Postpartum depression screening: negative.  Tobacco, alcohol and substance abuse history reviewed.  Adult immunizations reviewed including TDAP, rubella and varicella.  The following portions of the patient's history were reviewed and updated as appropriate: allergies, current medications, past family history, past medical history, past social history, past surgical history and problem list.  Review of Systems A comprehensive review of systems was negative.   Objective:    BP 136/96 mmHg  Pulse 73  Wt 228 lb (103.42 kg)   PE:  Deferred   100% of 10 min visit spent on counseling and coordination of care.   Assessment:    2 weeks postpartum.  Doing well. Contraceptive management  Plan:    1. Contraception: Depo-Provera injections 2. Depo Provera Rx 3. Follow up in: 4 weeks or as needed.   Healthy lifestyle practices reviewed

## 2015-05-19 ENCOUNTER — Ambulatory Visit: Payer: Medicaid Other

## 2015-06-10 ENCOUNTER — Ambulatory Visit (INDEPENDENT_AMBULATORY_CARE_PROVIDER_SITE_OTHER): Payer: Medicaid Other | Admitting: Obstetrics

## 2015-06-10 ENCOUNTER — Encounter: Payer: Self-pay | Admitting: Obstetrics

## 2015-06-10 DIAGNOSIS — O9089 Other complications of the puerperium, not elsewhere classified: Secondary | ICD-10-CM

## 2015-06-10 DIAGNOSIS — M549 Dorsalgia, unspecified: Secondary | ICD-10-CM

## 2015-06-10 DIAGNOSIS — R52 Pain, unspecified: Secondary | ICD-10-CM

## 2015-06-10 MED ORDER — OXYCODONE HCL 10 MG PO TABS: 10.0000 mg | ORAL_TABLET | Freq: Four times a day (QID) | ORAL | Status: DC | PRN

## 2015-06-10 NOTE — Progress Notes (Signed)
Subjective:     Katherine Douglas is a 34 y.o. female who presents for a postpartum visit. She is 8 weeks postpartum following a spontaneous vaginal delivery. I have fully reviewed the prenatal and intrapartum course. The delivery was at 35 gestational weeks. Outcome: spontaneous vaginal delivery. Anesthesia: epidural. Postpartum course has been normal. Baby's course has been normal. Baby is feeding by bottle - Similac Advance. Bleeding no bleeding. Bowel function is normal. Bladder function is normal. Patient is sexually active. Contraception method is none. Postpartum depression screening: negative.  Tobacco, alcohol and substance abuse history reviewed.  Adult immunizations reviewed including TDAP, rubella and varicella.  The following portions of the patient's history were reviewed and updated as appropriate: allergies, current medications, past family history, past medical history, past social history, past surgical history and problem list.  Review of Systems A comprehensive review of systems was negative.   Objective:    BP 144/90 mmHg  Pulse 103  Wt 223 lb (101.152 kg)  LMP 06/03/2015  General:  alert   Breasts:  inspection negative, no nipple discharge or bleeding, no masses or nodularity palpable  Lungs: clear to auscultation bilaterally  Heart:  regular rate and rhythm, S1, S2 normal, no murmur, click, rub or gallop  Abdomen: normal findings: soft, non-tender   Vulva:  normal  Vagina: normal vagina  Cervix:  no cervical motion tenderness  Corpus: normal size, contour, position, consistency, mobility, non-tender  Adnexa:  no mass, fullness, tenderness  Rectal Exam: Not performed.           Assessment:     Normal postpartum exam. Pap smear not done at today's visit.  Plan:    1. Contraception: Depo-Provera injections 2. Depo Provera Rx 3. Follow up in: 3 months or as needed.   Healthy lifestyle practices reviewed

## 2015-06-11 ENCOUNTER — Ambulatory Visit: Payer: Medicaid Other | Admitting: Obstetrics

## 2015-07-02 ENCOUNTER — Telehealth: Payer: Self-pay | Admitting: *Deleted

## 2015-07-02 NOTE — Telephone Encounter (Signed)
Patient is calling for pain medication- she wants to know if she can have a refill or if she needs to go to the hospital. Advil and Tylenol are not working. ( Oxycodone #40 given 9/14) 5:26 Attempted to call patient back and got biblical massage that went on for some time- hung up.

## 2015-07-06 ENCOUNTER — Telehealth: Payer: Self-pay | Admitting: *Deleted

## 2015-07-06 NOTE — Telephone Encounter (Signed)
Patient contacted the office stating she was still having pain. Patient states she is having back pain that she had suffered with prior to pregnancy. Patient states she was given a prescription for Oxycodone and Ibuprofen at her last appointment. Patient states she is out of the medication and is requesting a refill. Patient advised that Dr. Jodi Mourning doesn't generally refill narcotic medications this far after a vaginal delivery. Advised patient that I would pass her message along to Dr. Jodi Mourning for advisement. Advised patient that he may want to refer her to an orthopedic and refill her Ibuprofen. Patient states she is going to call back to schedule an appointment because she doesn't feel like anyone can explain her pain the way she can and would like to speak with Dr. Jodi Mourning directly. Advised patient she was welcome to schedule an appointment. Dr. Jodi Mourning advised of patient's symptoms.

## 2015-08-11 ENCOUNTER — Encounter: Payer: Self-pay | Admitting: Obstetrics

## 2015-08-11 ENCOUNTER — Ambulatory Visit (INDEPENDENT_AMBULATORY_CARE_PROVIDER_SITE_OTHER): Payer: Medicaid Other | Admitting: Obstetrics

## 2015-08-11 VITALS — BP 141/97 | HR 91 | Temp 99.3°F | Wt 236.0 lb

## 2015-08-11 DIAGNOSIS — M549 Dorsalgia, unspecified: Secondary | ICD-10-CM | POA: Diagnosis not present

## 2015-08-11 MED ORDER — CYCLOBENZAPRINE HCL 10 MG PO TABS: 10.0000 mg | ORAL_TABLET | Freq: Three times a day (TID) | ORAL | Status: DC

## 2015-08-11 MED ORDER — METHOCARBAMOL 500 MG PO TABS: 500.0000 mg | ORAL_TABLET | Freq: Three times a day (TID) | ORAL | Status: DC | PRN

## 2015-08-11 NOTE — Progress Notes (Signed)
Patient ID: Katherine Douglas, female   DOB: [birth year], 34 y.o.   MRN: [id number]  Chief Complaint  Patient presents with  . Back Pain    back pain since delivery, has contacted PCP-can't be seen until December.    HPI Katherine Douglas is a 34 y.o. female.  Lower back pain, near epidural site.  Chronic, has been going on for several months.  Taking Ibuprofen without relief.  HPI  Past Medical History  Diagnosis Date  . Stress   . Panic attacks   . Pneumonia   . Headache   . Diabetes mellitus without complication     pt states was gestational with previous pregnancy    Past Surgical History  Procedure Laterality Date  . Induced abortion      Family History  Problem Relation Age of Onset  . Hypertension Mother   . Heart failure Mother   . Diabetes Father     Social History Social History  Substance Use Topics  . Smoking status: Former Smoker -- 0.25 packs/day for 18 years    Types: Cigarettes    Quit date: 09/15/2014  . Smokeless tobacco: Never Used  . Alcohol Use: No    Allergies  Allergen Reactions  . Other Anaphylaxis    Crawfish   . Amoxicillin Hives  . Peanut-Containing Drug Products Itching  . Penicillins Itching  . Strawberry Extract Hives, Itching and Rash    Current Outpatient Prescriptions  Medication Sig Dispense Refill  . FeAsp-B12-FA-C-DSS-SuccAc-Zn (FERIVA 21/7) 75-1 MG TABS Take 1 tablet by mouth daily. 28 tablet 3  . medroxyPROGESTERone (DEPO-PROVERA) 150 MG/ML injection Inject 1 mL (150 mg total) into the muscle every 3 (three) months. 1 mL 3  . senna-docusate (SENOKOT-S) 8.6-50 MG per tablet Take 2 tablets by mouth at bedtime as needed for mild constipation. 60 tablet 2  . benzocaine-Menthol (DERMOPLAST) 20-0.5 % AERO Apply 1 application topically as needed for irritation (perineal discomfort). 78 g 0  . cyclobenzaprine (FLEXERIL) 10 MG tablet Take 1 tablet (10 mg total) by mouth 3 (three) times daily. Headaches 30 tablet 2  . dibucaine  (NUPERCAINAL) 1 % OINT Place 1 application rectally as needed for hemorrhoids. 56.7 g 0  . Fe Fum-FePoly-FA-Vit C-Vit B3 (INTEGRA F) 125-1 MG CAPS Take 1 tablet by mouth daily. 30 capsule 1  . lanolin OINT Apply 1 application topically as needed (for breast care). 40 g 0  . methocarbamol (ROBAXIN) 500 MG tablet Take 1 tablet (500 mg total) by mouth every 8 (eight) hours as needed for muscle spasms. 30 tablet 2  . Oxycodone HCl 10 MG TABS Take 1 tablet (10 mg total) by mouth every 6 (six) hours as needed. (Patient not taking: Reported on 08/11/2015) 40 tablet 0  . oxyCODONE-acetaminophen (PERCOCET/ROXICET) 5-325 MG per tablet Take 2 tablets by mouth every 4 (four) hours as needed (for pain scale greater than 7). (Patient not taking: Reported on 05/15/2015) 50 tablet 0  . witch hazel-glycerin (TUCKS) pad Apply 1 application topically as needed for hemorrhoids. (Patient not taking: Reported on 06/10/2015) 40 each 12   No current facility-administered medications for this visit.    Review of Systems Review of Systems Constitutional: negative for fatigue and weight loss Respiratory: negative for cough and wheezing Cardiovascular: negative for chest pain, fatigue and palpitations Gastrointestinal: negative for abdominal pain and change in bowel habits Genitourinary:negative Integument/breast: negative for nipple discharge Musculoskeletal: positive for myalgias Neurological: negative for gait problems and tremors Behavioral/Psych: negative for abusive relationship,  depression Endocrine: negative for temperature intolerance     Blood pressure 141/97, pulse 91, temperature 99.3 F (37.4 C), weight 236 lb (107.049 kg), unknown if currently breastfeeding.  Physical Exam Physical Exam: Deferred  100% of 10 min visit spent on counseling and coordination of care.   Data Reviewed Labs  Assessment     Backache     Plan    Flexeril / Robaxin Rx Referred to Orthopedics F/U prn   Orders  Placed This Encounter  Procedures  . Ambulatory referral to Orthopedics    Referral Priority:  Routine    Referral Type:  Consultation    Number of Visits Requested:  1   Meds ordered this encounter  Medications  . methocarbamol (ROBAXIN) 500 MG tablet    Sig: Take 1 tablet (500 mg total) by mouth every 8 (eight) hours as needed for muscle spasms.    Dispense:  30 tablet    Refill:  2  . cyclobenzaprine (FLEXERIL) 10 MG tablet    Sig: Take 1 tablet (10 mg total) by mouth 3 (three) times daily. Headaches    Dispense:  30 tablet    Refill:  2

## 2015-09-09 ENCOUNTER — Ambulatory Visit: Payer: Medicaid Other | Admitting: Obstetrics

## 2015-10-28 ENCOUNTER — Ambulatory Visit: Payer: Medicaid Other | Admitting: Obstetrics

## 2016-04-20 ENCOUNTER — Ambulatory Visit: Payer: Self-pay | Admitting: Obstetrics and Gynecology

## 2016-05-02 ENCOUNTER — Ambulatory Visit: Payer: Self-pay | Admitting: Obstetrics and Gynecology

## 2016-05-24 ENCOUNTER — Ambulatory Visit: Payer: Self-pay | Admitting: Obstetrics and Gynecology

## 2016-06-07 ENCOUNTER — Ambulatory Visit: Payer: Self-pay | Admitting: Obstetrics & Gynecology

## 2016-06-18 ENCOUNTER — Emergency Department (HOSPITAL_COMMUNITY): Payer: Medicaid Other

## 2016-06-18 ENCOUNTER — Encounter (HOSPITAL_COMMUNITY): Payer: Self-pay | Admitting: Emergency Medicine

## 2016-06-18 ENCOUNTER — Emergency Department (HOSPITAL_COMMUNITY)
Admission: EM | Admit: 2016-06-18 | Discharge: 2016-06-18 | Disposition: A | Discharge status: Home / Self Care | Payer: Medicaid Other | Attending: Emergency Medicine | Admitting: Emergency Medicine

## 2016-06-18 DIAGNOSIS — R1030 Lower abdominal pain, unspecified: Secondary | ICD-10-CM | POA: Diagnosis not present

## 2016-06-18 DIAGNOSIS — Z79899 Other long term (current) drug therapy: Secondary | ICD-10-CM | POA: Insufficient documentation

## 2016-06-18 DIAGNOSIS — R0789 Other chest pain: Secondary | ICD-10-CM | POA: Diagnosis present

## 2016-06-18 DIAGNOSIS — Z87891 Personal history of nicotine dependence: Secondary | ICD-10-CM | POA: Diagnosis not present

## 2016-06-18 DIAGNOSIS — E119 Type 2 diabetes mellitus without complications: Secondary | ICD-10-CM | POA: Diagnosis not present

## 2016-06-18 DIAGNOSIS — R079 Chest pain, unspecified: Secondary | ICD-10-CM

## 2016-06-18 LAB — URINALYSIS, ROUTINE W REFLEX MICROSCOPIC
Bilirubin Urine: NEGATIVE
Glucose, UA: NEGATIVE mg/dL
HGB URINE DIPSTICK: NEGATIVE
KETONES UR: NEGATIVE mg/dL
LEUKOCYTES UA: NEGATIVE
Nitrite: NEGATIVE
PH: 6 (ref 5.0–8.0)
PROTEIN: NEGATIVE mg/dL
Specific Gravity, Urine: 1.031 — ABNORMAL HIGH (ref 1.005–1.030)

## 2016-06-18 LAB — CBC
HCT: 40.1 % (ref 36.0–46.0)
Hemoglobin: 12.9 g/dL (ref 12.0–15.0)
MCH: 28.4 pg (ref 26.0–34.0)
MCHC: 32.2 g/dL (ref 30.0–36.0)
MCV: 88.3 fL (ref 78.0–100.0)
Platelets: 444 10*3/uL — ABNORMAL HIGH (ref 150–400)
RBC: 4.54 MIL/uL (ref 3.87–5.11)
RDW: 15.2 % (ref 11.5–15.5)
WBC: 12.5 10*3/uL — ABNORMAL HIGH (ref 4.0–10.5)

## 2016-06-18 LAB — I-STAT TROPONIN, ED: Troponin i, poc: 0 ng/mL (ref 0.00–0.08)

## 2016-06-18 LAB — BASIC METABOLIC PANEL
Anion gap: 9 (ref 5–15)
BUN: 16 mg/dL (ref 6–20)
CALCIUM: 8.9 mg/dL (ref 8.9–10.3)
CO2: 26 mmol/L (ref 22–32)
Chloride: 102 mmol/L (ref 101–111)
Creatinine, Ser: 1.03 mg/dL — ABNORMAL HIGH (ref 0.44–1.00)
GFR calc Af Amer: >60 mL/min (ref >60)
GLUCOSE: 132 mg/dL — AB (ref 65–99)
Potassium: 3.7 mmol/L (ref 3.5–5.1)
Sodium: 137 mmol/L (ref 135–145)

## 2016-06-18 LAB — HIV ANTIBODY (ROUTINE TESTING W REFLEX): HIV SCREEN 4TH GENERATION: NONREACTIVE

## 2016-06-18 LAB — POC URINE PREG, ED: Preg Test, Ur: NEGATIVE

## 2016-06-18 LAB — WET PREP, GENITAL
SPERM: NONE SEEN
Trich, Wet Prep: NONE SEEN
Yeast Wet Prep HPF POC: NONE SEEN

## 2016-06-18 LAB — RPR: RPR Ser Ql: NONREACTIVE

## 2016-06-18 MED ORDER — AZITHROMYCIN 250 MG PO TABS
2000.0000 mg | ORAL_TABLET | Freq: Once | ORAL | Status: AC
  Administered 2016-06-18: 2000 mg via ORAL
  Filled 2016-06-18: qty 8

## 2016-06-18 MED ORDER — GENTAMICIN SULFATE 40 MG/ML IJ SOLN
240.0000 mg | Freq: Once | INTRAMUSCULAR | Status: AC
  Administered 2016-06-18: 240 mg via INTRAMUSCULAR
  Filled 2016-06-18: qty 6

## 2016-06-18 NOTE — ED Notes (Signed)
Pt ambulated out of department with a steady gait.  No reaction to medication noted at time of discharge.

## 2016-06-18 NOTE — ED Triage Notes (Signed)
Pt from home with complaints of central chest pain that she describes as pressure. Pt denies radiation. Pt denies lightheadedness. Pt is not diaphoretic. Pt has normal heart and lung sounds. Pt has adequate cap refill and radial pulses. Pt also has complaints of lower abdominal cramps and burning with urination. These symptoms have been present x 2 days

## 2016-06-18 NOTE — ED Provider Notes (Signed)
[facility] DEPT Provider Note   CSN: [id number] Arrival date & time: 06/18/16  0157     History   Chief Complaint Chief Complaint  Patient presents with  . Chest Pain  . Abdominal Pain    HPI Katherine Douglas is a 35 y.o. female.  Patient presents with complaint of chest discomfort, described as located in the left sternal border and is pressure like in nature, started 1 week ago. She feels she has been under significant stress and this may be the cause of the chest pain. She also complains of lower abdominal pain that started 2-3 days ago. She denies urinary symptoms, back pain or abnormal vaginal bleeding. She reports a clear vaginal discharge. She is concerned because she recently became sexually active again after one year and "the condom broke" so she is concerned about the possibility of STD's. No SOB, cough, fever, vomiting. No aggravating or alleviating factors.    The history is provided by the patient. No language interpreter was used.    Past Medical History:  Diagnosis Date  . Diabetes mellitus without complication ([place])    pt states was gestational with previous pregnancy  . Headache   . Panic attacks   . Pneumonia   . Stress     Patient Active Problem List   Diagnosis Date Noted  . NSVD (normal spontaneous vaginal delivery) 04/15/2015  . Multiparity 04/14/2015  . Pain in the chest   . SOB (shortness of breath)   . Tachycardia   . Chest pain 08/14/2014  . Sepsis ([place]) 08/14/2014  . Pneumonia 08/14/2014  . Microcytic anemia 08/14/2014  . Tobacco abuse 08/14/2014    Past Surgical History:  Procedure Laterality Date  . INDUCED ABORTION      OB History    Gravida Para Term Preterm AB Living   5 3 3   2 3    SAB TAB Ectopic Multiple Live Births     2   0 3       Home Medications    Prior to Admission medications   Medication Sig Start Date End Date Taking? Authorizing Provider  amLODipine (NORVASC) 5 MG tablet Take 5 mg by mouth daily.    Yes Historical Provider, MD  medroxyPROGESTERone (DEPO-PROVERA) 150 MG/ML injection Inject 1 mL (150 mg total) into the muscle every 3 (three) months. Patient not taking: Reported on 06/18/2016 05/15/15   Shelly Bombard, MD  methocarbamol (ROBAXIN) 500 MG tablet Take 1 tablet (500 mg total) by mouth every 8 (eight) hours as needed for muscle spasms. Patient not taking: Reported on 06/18/2016 08/11/15   Shelly Bombard, MD  witch hazel-glycerin (TUCKS) pad Apply 1 application topically as needed for hemorrhoids. Patient not taking: Reported on 06/10/2015 04/17/15   Morene Crocker, CNM    Family History Family History  Problem Relation Age of Onset  . Hypertension Mother   . Heart failure Mother   . Diabetes Father     Social History Social History  Substance Use Topics  . Smoking status: Former Smoker    Packs/day: 0.25    Years: 18.00    Types: Cigarettes    Quit date: 09/15/2014  . Smokeless tobacco: Never Used  . Alcohol use No     Allergies   Other; Amoxicillin; Peanut-containing drug products; Penicillins; and Strawberry extract   Review of Systems Review of Systems  Constitutional: Negative for chills and fever.  HENT: Negative.   Respiratory: Negative.   Cardiovascular: Positive for chest pain.  Gastrointestinal: Positive for abdominal pain. Negative for nausea and vomiting.  Genitourinary: Positive for vaginal discharge. Negative for dysuria, flank pain and vaginal bleeding.  Musculoskeletal: Negative.  Negative for back pain.  Neurological: Negative.      Physical Exam Updated Vital Signs BP 121/62   Pulse 88   Temp 98.3 F (36.8 C) (Oral)   Resp 20   LMP 05/27/2016   SpO2 95%   Physical Exam  Constitutional: She appears well-developed and well-nourished.  HENT:  Head: Normocephalic.  Neck: Normal range of motion. Neck supple.  Cardiovascular: Normal rate and regular rhythm.   Pulmonary/Chest: Effort normal and breath sounds normal. She  exhibits tenderness (Mild tenderness to left mid-sternal border).  Abdominal: Soft. Bowel sounds are normal. There is no tenderness. There is no rebound and no guarding.  Genitourinary:  Genitourinary Comments: No vaginal discharge present on exam. No CMT. There is minimal tenderness to palpation throughout the pelvis without mass.   Musculoskeletal: Normal range of motion.  Neurological: She is alert. No cranial nerve deficit.  Skin: Skin is warm and dry. No rash noted.  Psychiatric: She has a normal mood and affect.     ED Treatments / Results  Labs (all labs ordered are listed, but only abnormal results are displayed) Labs Reviewed  WET PREP, GENITAL - Abnormal; Notable for the following:       Result Value   Clue Cells Wet Prep HPF POC PRESENT (*)    WBC, Wet Prep HPF POC MODERATE (*)    All other components within normal limits  BASIC METABOLIC PANEL - Abnormal; Notable for the following:    Glucose, Bld 132 (*)    Creatinine, Ser 1.03 (*)    All other components within normal limits  CBC - Abnormal; Notable for the following:    WBC 12.5 (*)    Platelets 444 (*)    All other components within normal limits  URINALYSIS, ROUTINE W REFLEX MICROSCOPIC (NOT AT [facility]) - Abnormal; Notable for the following:    APPearance CLOUDY (*)    Specific Gravity, Urine 1.031 (*)    All other components within normal limits  RPR  HIV ANTIBODY (ROUTINE TESTING)  I-STAT TROPOININ, ED  POC URINE PREG, ED  GC/CHLAMYDIA PROBE AMP ([place]) NOT AT [facility]   Results for orders placed or performed during the hospital encounter of 06/18/16  Wet prep, genital  Result Value Ref Range   Yeast Wet Prep HPF POC NONE SEEN NONE SEEN   Trich, Wet Prep NONE SEEN NONE SEEN   Clue Cells Wet Prep HPF POC PRESENT (A) NONE SEEN   WBC, Wet Prep HPF POC MODERATE (A) NONE SEEN   Sperm NONE SEEN   Basic metabolic panel  Result Value Ref Range   Sodium 137 135 - 145 mmol/L   Potassium 3.7 3.5 - 5.1 mmol/L    Chloride 102 101 - 111 mmol/L   CO2 26 22 - 32 mmol/L   Glucose, Bld 132 (H) 65 - 99 mg/dL   BUN 16 6 - 20 mg/dL   Creatinine, Ser 1.03 (H) 0.44 - 1.00 mg/dL   Calcium 8.9 8.9 - 10.3 mg/dL   GFR calc non Af Amer >60 >60 mL/min   GFR calc Af Amer >60 >60 mL/min   Anion gap 9 5 - 15  CBC  Result Value Ref Range   WBC 12.5 (H) 4.0 - 10.5 K/uL   RBC 4.54 3.87 - 5.11 MIL/uL   Hemoglobin 12.9 12.0 -  15.0 g/dL   HCT 40.1 36.0 - 46.0 %   MCV 88.3 78.0 - 100.0 fL   MCH 28.4 26.0 - 34.0 pg   MCHC 32.2 30.0 - 36.0 g/dL   RDW 15.2 11.5 - 15.5 %   Platelets 444 (H) 150 - 400 K/uL  Urinalysis, Routine w reflex microscopic  Result Value Ref Range   Color, Urine YELLOW YELLOW   APPearance CLOUDY (A) CLEAR   Specific Gravity, Urine 1.031 (H) 1.005 - 1.030   pH 6.0 5.0 - 8.0   Glucose, UA NEGATIVE NEGATIVE mg/dL   Hgb urine dipstick NEGATIVE NEGATIVE   Bilirubin Urine NEGATIVE NEGATIVE   Ketones, ur NEGATIVE NEGATIVE mg/dL   Protein, ur NEGATIVE NEGATIVE mg/dL   Nitrite NEGATIVE NEGATIVE   Leukocytes, UA NEGATIVE NEGATIVE  I-stat troponin, ED  Result Value Ref Range   Troponin i, poc 0.00 0.00 - 0.08 ng/mL   Comment 3          POC urine preg, ED  Result Value Ref Range   Preg Test, Ur NEGATIVE NEGATIVE    EKG  EKG Interpretation  Date/Time:  Saturday June 18 2016 02:02:44 EDT Ventricular Rate:  79 PR Interval:    QRS Duration: 74 QT Interval:  367 QTC Calculation: 421 R Axis:   83 Text Interpretation:  Sinus rhythm tachcyardia has now resolved Confirmed by Glynn Octave [phone]) on 06/18/2016 2:12:58 AM       Radiology Dg Chest 2 View  Result Date: 06/18/2016 CLINICAL DATA:  Acute onset of mid to left-sided chest pain. Initial encounter. EXAM: CHEST  2 VIEW COMPARISON:  Chest radiograph performed 12/15/2014 FINDINGS: The lungs are well-aerated and clear. There is no evidence of focal opacification, pleural effusion or pneumothorax. The heart is normal in size; the  mediastinal contour is within normal limits. No acute osseous abnormalities are seen. IMPRESSION: No acute cardiopulmonary process seen. Electronically Signed   By: Garald Balding M.D.   On: 06/18/2016 03:10    Procedures Procedures (including critical care time)  Medications Ordered in ED Medications  azithromycin (ZITHROMAX) tablet 2,000 mg (not administered)  gentamicin (GARAMYCIN) injection 240 mg (not administered)     Initial Impression / Assessment and Plan / ED Course  I have reviewed the triage vital signs and the nursing notes.  Pertinent labs & imaging results that were available during my care of the patient were reviewed by me and considered in my medical decision making (see chart for details).  Clinical Course    Patient with complaint of chest pain x 1 week and lower abdominal pain x 2-3 days. No fever, SoB, cough, vomiting, dysuria. Chest pain is atypical with normal troponin, CXR and EKG. Doubt ACS. Vaginal exam does not provide cause of lower abdominal discomfort x 2-3 days. Possibly from resuming sexual activity. No evidence of infection.   Will discharge home with recommendation for close PCP follow up.  Final Clinical Impressions(s) / ED Diagnoses   Final diagnoses:  None   1. Chest pain, nonspecific 2. Lower abdominal pain  New Prescriptions New Prescriptions   No medications on file     Charlann Lange, PA-C 06/18/16 [place], MD 06/18/16 [phone]

## 2016-06-20 LAB — GC/CHLAMYDIA PROBE AMP (~~LOC~~) NOT AT ARMC
Chlamydia: NEGATIVE
Neisseria Gonorrhea: NEGATIVE

## 2016-09-01 ENCOUNTER — Ambulatory Visit: Payer: Medicaid Other | Admitting: Obstetrics and Gynecology

## 2016-10-04 ENCOUNTER — Ambulatory Visit: Payer: Medicaid Other | Admitting: Obstetrics and Gynecology

## 2016-11-01 ENCOUNTER — Emergency Department (HOSPITAL_COMMUNITY)
Admission: EM | Admit: 2016-11-01 | Discharge: 2016-11-01 | Disposition: A | Discharge status: Home / Self Care | Payer: Medicaid Other | Attending: Emergency Medicine | Admitting: Emergency Medicine

## 2016-11-01 ENCOUNTER — Encounter (HOSPITAL_COMMUNITY): Payer: Self-pay | Admitting: Emergency Medicine

## 2016-11-01 DIAGNOSIS — Z5321 Procedure and treatment not carried out due to patient leaving prior to being seen by health care provider: Secondary | ICD-10-CM | POA: Insufficient documentation

## 2016-11-01 DIAGNOSIS — R109 Unspecified abdominal pain: Secondary | ICD-10-CM | POA: Insufficient documentation

## 2016-11-01 LAB — URINALYSIS, ROUTINE W REFLEX MICROSCOPIC
BILIRUBIN URINE: NEGATIVE
GLUCOSE, UA: NEGATIVE mg/dL
HGB URINE DIPSTICK: NEGATIVE
KETONES UR: NEGATIVE mg/dL
Leukocytes, UA: NEGATIVE
NITRITE: NEGATIVE
PH: 6 (ref 5.0–8.0)
Protein, ur: NEGATIVE mg/dL
Specific Gravity, Urine: 1.024 (ref 1.005–1.030)

## 2016-11-01 LAB — POC URINE PREG, ED: Preg Test, Ur: NEGATIVE

## 2016-11-01 NOTE — ED Notes (Signed)
Pt called for room 1x, no response

## 2016-11-01 NOTE — ED Notes (Signed)
Called from lobby; no response. 

## 2016-11-01 NOTE — ED Triage Notes (Addendum)
Pt reports intermittent central abd pain for the last 3 weeks with occasional emesis depending on what she eats. No diarrhea.

## 2016-11-18 ENCOUNTER — Encounter (HOSPITAL_COMMUNITY): Payer: Self-pay | Admitting: *Deleted

## 2016-11-18 ENCOUNTER — Emergency Department (HOSPITAL_COMMUNITY)
Admission: EM | Admit: 2016-11-18 | Discharge: 2016-11-18 | Disposition: A | Discharge status: Home / Self Care | Payer: Medicaid Other | Attending: Emergency Medicine | Admitting: Emergency Medicine

## 2016-11-18 ENCOUNTER — Emergency Department (HOSPITAL_COMMUNITY): Payer: Medicaid Other

## 2016-11-18 DIAGNOSIS — E119 Type 2 diabetes mellitus without complications: Secondary | ICD-10-CM | POA: Insufficient documentation

## 2016-11-18 DIAGNOSIS — Z91010 Allergy to peanuts: Secondary | ICD-10-CM | POA: Insufficient documentation

## 2016-11-18 DIAGNOSIS — Z87891 Personal history of nicotine dependence: Secondary | ICD-10-CM | POA: Diagnosis not present

## 2016-11-18 DIAGNOSIS — R071 Chest pain on breathing: Secondary | ICD-10-CM | POA: Diagnosis not present

## 2016-11-18 DIAGNOSIS — Z7982 Long term (current) use of aspirin: Secondary | ICD-10-CM | POA: Insufficient documentation

## 2016-11-18 DIAGNOSIS — R079 Chest pain, unspecified: Secondary | ICD-10-CM

## 2016-11-18 LAB — CBC
HCT: 39.1 % (ref 36.0–46.0)
Hemoglobin: 12.9 g/dL (ref 12.0–15.0)
MCH: 28.7 pg (ref 26.0–34.0)
MCHC: 33 g/dL (ref 30.0–36.0)
MCV: 86.9 fL (ref 78.0–100.0)
PLATELETS: 502 10*3/uL — AB (ref 150–400)
RBC: 4.5 MIL/uL (ref 3.87–5.11)
RDW: 15.2 % (ref 11.5–15.5)
WBC: 9.6 10*3/uL (ref 4.0–10.5)

## 2016-11-18 LAB — BASIC METABOLIC PANEL
Anion gap: 8 (ref 5–15)
BUN: 13 mg/dL (ref 6–20)
CALCIUM: 9.1 mg/dL (ref 8.9–10.3)
CHLORIDE: 108 mmol/L (ref 101–111)
CO2: 26 mmol/L (ref 22–32)
CREATININE: 0.68 mg/dL (ref 0.44–1.00)
GFR calc Af Amer: >60 mL/min (ref >60)
GFR calc non Af Amer: >60 mL/min (ref >60)
GLUCOSE: 98 mg/dL (ref 65–99)
Potassium: 3.9 mmol/L (ref 3.5–5.1)
Sodium: 142 mmol/L (ref 135–145)

## 2016-11-18 LAB — I-STAT TROPONIN, ED: TROPONIN I, POC: 0 ng/mL (ref 0.00–0.08)

## 2016-11-18 NOTE — Discharge Instructions (Signed)
Use heat on the sore area 3 or 4 times a day.   Use Motrin or Tylenol for pain  See the doctor of your choice as needed for problems

## 2016-11-18 NOTE — ED Triage Notes (Signed)
Pt reports pain under her L breast x 3 days ago, noticed a Knot in her L breast.  Pt reports pain is non-radiating, describes it as shooting and pressure.  Pt reports pain is worse when taking a deep breath.

## 2016-11-18 NOTE — ED Notes (Signed)
RN spoke with EDP Katherine Douglas regardingPts elevated BP.  RN educated Katherine Douglas per Dr. Eulis Foster that she should seek appt with PCP to follow up on BP.  RN also educated Katherine Douglas to avoid salty foods and to monitor her BP at home if possible.

## 2016-11-18 NOTE — ED Notes (Signed)
Called for pt twice

## 2016-11-18 NOTE — ED Provider Notes (Signed)
[facility] DEPT Provider Note   CSN: [id number] Arrival date & time: 11/18/16  1545     History   Chief Complaint Chief Complaint  Patient presents with  . Chest Pain  . Knot in Breast    HPI Katherine Douglas is a 36 y.o. female.  She complains of a sore area of her chest, for 3 days, on and off, worse with deep breathing and movement.  No persistent chest pain.  No fever chills cough shortness of breath weakness or dizziness.  No known trauma.  She denies swelling of arms or legs.  She works as a Radiographer, therapeutic.  There are no other known modifying factors.  HPI  Past Medical History:  Diagnosis Date  . Diabetes mellitus without complication ([place])    pt states was gestational with previous pregnancy  . Headache   . Panic attacks   . Pneumonia   . Stress     Patient Active Problem List   Diagnosis Date Noted  . NSVD (normal spontaneous vaginal delivery) 04/15/2015  . Multiparity 04/14/2015  . Pain in the chest   . SOB (shortness of breath)   . Tachycardia   . Chest pain 08/14/2014  . Sepsis ([place]) 08/14/2014  . Pneumonia 08/14/2014  . Microcytic anemia 08/14/2014  . Tobacco abuse 08/14/2014    Past Surgical History:  Procedure Laterality Date  . INDUCED ABORTION      OB History    Gravida Para Term Preterm AB Living   5 3 3   2 3    SAB TAB Ectopic Multiple Live Births     2   0 3       Home Medications    Prior to Admission medications   Medication Sig Start Date End Date Taking? Authorizing Provider  aspirin EC 81 MG tablet Take 162 mg by mouth daily as needed for mild pain.   Yes Historical Provider, MD  medroxyPROGESTERone (DEPO-PROVERA) 150 MG/ML injection Inject 1 mL (150 mg total) into the muscle every 3 (three) months. Patient not taking: Reported on 06/18/2016 05/15/15   Shelly Bombard, MD    Family History Family History  Problem Relation Age of Onset  . Hypertension Mother   . Heart failure Mother   . Diabetes Father     Social  History Social History  Substance Use Topics  . Smoking status: Former Smoker    Packs/day: 0.25    Years: 18.00    Types: Cigarettes    Quit date: 09/15/2014  . Smokeless tobacco: Never Used  . Alcohol use No     Allergies   Other; Amoxicillin; Peanut-containing drug products; Penicillins; and Strawberry extract   Review of Systems Review of Systems  All other systems reviewed and are negative.    Physical Exam Updated Vital Signs BP 124/81 (BP Location: Left Arm)   Pulse 79   Temp 98.1 F (36.7 C) (Oral)   Resp 15   Ht 5\' 1"  (1.549 m)   Wt 240 lb (108.9 kg)   LMP 11/14/2016   SpO2 98%   BMI 45.35 kg/m   Physical Exam  Constitutional: She is oriented to person, place, and time. She appears well-developed.  Morbidly obese  HENT:  Head: Normocephalic and atraumatic.  Eyes: Conjunctivae and EOM are normal. Pupils are equal, round, and reactive to light.  Neck: Normal range of motion and phonation normal. Neck supple.  Cardiovascular: Normal rate and regular rhythm.   Pulmonary/Chest: Effort normal and breath sounds normal. She  exhibits no tenderness (Tender left lower anterior chest wall palpable mass or deformity.  Left breast is full, and there is no deformity mass or nipple abnormality.).  Abdominal: Soft. She exhibits no distension. There is no tenderness. There is no guarding.  Musculoskeletal: Normal range of motion.  Neurological: She is alert and oriented to person, place, and time. She exhibits normal muscle tone.  Skin: Skin is warm and dry.  Psychiatric: She has a normal mood and affect. Her behavior is normal. Judgment and thought content normal.  Nursing note and vitals reviewed.    ED Treatments / Results  Labs (all labs ordered are listed, but only abnormal results are displayed) Labs Reviewed  CBC - Abnormal; Notable for the following:       Result Value   Platelets 502 (*)    All other components within normal limits  BASIC METABOLIC PANEL   I-STAT TROPOININ, ED    EKG  EKG Interpretation  Date/Time:  Friday November 18 2016 16:00:26 EST Ventricular Rate:  72 PR Interval:    QRS Duration: 75 QT Interval:  399 QTC Calculation: 437 R Axis:   73 Text Interpretation:  Sinus rhythm since last tracing no significant change Confirmed by Eulis Foster  MD, Marquail Bradwell [id number]) on 11/18/2016 6:21:02 PM       Radiology Dg Chest 2 View  Result Date: 11/18/2016 CLINICAL DATA:  Pain under the left breast, chest pressure EXAM: CHEST  2 VIEW COMPARISON:  06/18/2016 FINDINGS: The heart size and mediastinal contours are within normal limits. Both lungs are clear. The visualized skeletal structures are unremarkable. IMPRESSION: No active cardiopulmonary disease. Electronically Signed   By: Donavan Foil M.D.   On: 11/18/2016 18:22    Procedures Procedures (including critical care time)  Medications Ordered in ED Medications - No data to display   Initial Impression / Assessment and Plan / ED Course  I have reviewed the triage vital signs and the nursing notes.  Pertinent labs & imaging results that were available during my care of the patient were reviewed by me and considered in my medical decision making (see chart for details).     Medications - No data to display  Patient Vitals for the past 24 hrs:  BP Temp Temp src Pulse Resp SpO2 Height Weight  11/18/16 1808 124/81 - - 79 15 98 % - -  11/18/16 1602 - - - - - - 5\' 1"  (1.549 m) 240 lb (108.9 kg)  11/18/16 1559 141/88 98.1 F (36.7 C) Oral 69 17 99 % - -    At D/C Reevaluation with update and discussion. After initial assessment and treatment, an updated evaluation reveals the patient is comfortable.  Findings discussed with the patient and all questions were answered.Daleen Bo L    Final Clinical Impressions(s) / ED Diagnoses   Final diagnoses:  Nonspecific chest pain     Nonspecific chest pain, without findings for suspected breast abnormality.  Suspect chest wall  source for the discomfort, possibly related to her obesity.  There are no cardiopulmonary symptoms.  Doubt pneumonia or ACS.  Nursing Notes Reviewed/ Care Coordinated Applicable Imaging Reviewed Interpretation of Laboratory Data incorporated into ED treatment  The patient appears reasonably screened and/or stabilized for discharge and I doubt any other medical condition or other [facility] requiring further screening, evaluation, or treatment in the ED at this time prior to discharge.  Plan: Home Medications-over-the-counter analgesia; Home Treatments-heat to sore area ; return here if the recommended treatment, does not improve  the symptoms; Recommended follow up-PCP, as needed  New Prescriptions New Prescriptions   No medications on file     Daleen Bo, MD 11/20/16 1326

## 2017-04-01 ENCOUNTER — Encounter (HOSPITAL_COMMUNITY): Payer: Self-pay | Admitting: Emergency Medicine

## 2017-04-01 ENCOUNTER — Emergency Department (HOSPITAL_COMMUNITY)
Admission: EM | Admit: 2017-04-01 | Discharge: 2017-04-01 | Disposition: A | Discharge status: Home / Self Care | Payer: Medicaid Other | Attending: Emergency Medicine | Admitting: Emergency Medicine

## 2017-04-01 DIAGNOSIS — E119 Type 2 diabetes mellitus without complications: Secondary | ICD-10-CM | POA: Diagnosis not present

## 2017-04-01 DIAGNOSIS — R1012 Left upper quadrant pain: Secondary | ICD-10-CM

## 2017-04-01 DIAGNOSIS — Z91010 Allergy to peanuts: Secondary | ICD-10-CM | POA: Diagnosis not present

## 2017-04-01 DIAGNOSIS — R300 Dysuria: Secondary | ICD-10-CM | POA: Diagnosis not present

## 2017-04-01 DIAGNOSIS — Z7982 Long term (current) use of aspirin: Secondary | ICD-10-CM | POA: Insufficient documentation

## 2017-04-01 DIAGNOSIS — Z87891 Personal history of nicotine dependence: Secondary | ICD-10-CM | POA: Insufficient documentation

## 2017-04-01 LAB — URINALYSIS, ROUTINE W REFLEX MICROSCOPIC
Bilirubin Urine: NEGATIVE
GLUCOSE, UA: NEGATIVE mg/dL
Hgb urine dipstick: NEGATIVE
Ketones, ur: NEGATIVE mg/dL
Nitrite: NEGATIVE
PROTEIN: 30 mg/dL — AB
SPECIFIC GRAVITY, URINE: 1.021 (ref 1.005–1.030)
pH: 6 (ref 5.0–8.0)

## 2017-04-01 LAB — COMPREHENSIVE METABOLIC PANEL
ALBUMIN: 3.4 g/dL — AB (ref 3.5–5.0)
ALK PHOS: 83 U/L (ref 38–126)
ALT: 17 U/L (ref 14–54)
AST: 20 U/L (ref 15–41)
Anion gap: 7 (ref 5–15)
BILIRUBIN TOTAL: 0.4 mg/dL (ref 0.3–1.2)
BUN: 8 mg/dL (ref 6–20)
CALCIUM: 8.9 mg/dL (ref 8.9–10.3)
CO2: 26 mmol/L (ref 22–32)
CREATININE: 0.82 mg/dL (ref 0.44–1.00)
Chloride: 104 mmol/L (ref 101–111)
GFR calc Af Amer: >60 mL/min (ref >60)
GFR calc non Af Amer: >60 mL/min (ref >60)
GLUCOSE: 119 mg/dL — AB (ref 65–99)
Potassium: 4.3 mmol/L (ref 3.5–5.1)
SODIUM: 137 mmol/L (ref 135–145)
Total Protein: 6.2 g/dL — ABNORMAL LOW (ref 6.5–8.1)

## 2017-04-01 LAB — LIPASE, BLOOD: Lipase: 22 U/L (ref 11–51)

## 2017-04-01 LAB — CBC
HCT: 38.4 % (ref 36.0–46.0)
Hemoglobin: 12.2 g/dL (ref 12.0–15.0)
MCH: 27.5 pg (ref 26.0–34.0)
MCHC: 31.8 g/dL (ref 30.0–36.0)
MCV: 86.7 fL (ref 78.0–100.0)
Platelets: 388 10*3/uL (ref 150–400)
RBC: 4.43 MIL/uL (ref 3.87–5.11)
RDW: 14.9 % (ref 11.5–15.5)
WBC: 11 10*3/uL — ABNORMAL HIGH (ref 4.0–10.5)

## 2017-04-01 LAB — HCG, QUANTITATIVE, PREGNANCY: HCG, BETA CHAIN, QUANT, S: 1 m[IU]/mL (ref <5)

## 2017-04-01 NOTE — Discharge Instructions (Signed)
Follow up with your doctor for further evaluation if abdominal pain continues. Recommend Tylenol as needed.

## 2017-04-01 NOTE — ED Notes (Addendum)
Pt declined to wait for discharge instructions.

## 2017-04-01 NOTE — ED Triage Notes (Signed)
Pt reports L sided abd pain with radiation into L flank. Present since last night, denies N/V/D. Pt states pain with urination. Denies hx of kidney stones.

## 2017-04-01 NOTE — ED Provider Notes (Signed)
[place] DEPT Provider Note   CSN: [id number] Arrival date & time: 04/01/17  0226     History   Chief Complaint Chief Complaint  Patient presents with  . Abdominal Pain    HPI Katherine Douglas is a 36 y.o. female.  Patient presents with complaint of LUQ abdominal pain that started yesterday (03/31/17). No nausea, vomiting, diarrhea. She feels discomfort with urination but denies hematuria, fever. No vaginal discharge or bleeding. No fever. No pain in flank and no history of kidney stone.    The history is provided by the patient. No language interpreter was used.  Abdominal Pain   Associated symptoms include dysuria. Pertinent negatives include fever, nausea, vomiting and hematuria.    Past Medical History:  Diagnosis Date  . Diabetes mellitus without complication ([place])    pt states was gestational with previous pregnancy  . Headache   . Panic attacks   . Pneumonia   . Stress     Patient Active Problem List   Diagnosis Date Noted  . NSVD (normal spontaneous vaginal delivery) 04/15/2015  . Multiparity 04/14/2015  . Pain in the chest   . SOB (shortness of breath)   . Tachycardia   . Chest pain 08/14/2014  . Sepsis ([place]) 08/14/2014  . Pneumonia 08/14/2014  . Microcytic anemia 08/14/2014  . Tobacco abuse 08/14/2014    Past Surgical History:  Procedure Laterality Date  . INDUCED ABORTION      OB History    Gravida Para Term Preterm AB Living   5 3 3   2 3    SAB TAB Ectopic Multiple Live Births     2   0 3       Home Medications    Prior to Admission medications   Medication Sig Start Date End Date Taking? Authorizing Provider  aspirin EC 81 MG tablet Take 162 mg by mouth daily as needed for mild pain.    [provider]  medroxyPROGESTERone (DEPO-PROVERA) 150 MG/ML injection Inject 1 mL (150 mg total) into the muscle every 3 (three) months. Patient not taking: Reported on 06/18/2016 05/15/15   Shelly Bombard, MD    Family  History Family History  Problem Relation Age of Onset  . Hypertension Mother   . Heart failure Mother   . Diabetes Father     Social History Social History  Substance Use Topics  . Smoking status: Former Smoker    Packs/day: 0.25    Years: 18.00    Types: Cigarettes    Quit date: 09/15/2014  . Smokeless tobacco: Never Used  . Alcohol use No     Allergies   Other; Amoxicillin; Peanut-containing drug products; Penicillins; and Strawberry extract   Review of Systems Review of Systems  Constitutional: Negative for chills and fever.  Respiratory: Negative.   Cardiovascular: Negative.   Gastrointestinal: Positive for abdominal pain. Negative for nausea and vomiting.  Genitourinary: Positive for dysuria. Negative for flank pain and hematuria.  Musculoskeletal: Negative.  Negative for back pain.  Skin: Negative.   Neurological: Negative.      Physical Exam Updated Vital Signs BP 127/82   Pulse 85   Temp 98 F (36.7 C) (Oral)   Ht 5\' 5"  (1.651 m)   Wt 117.9 kg (260 lb)   SpO2 91%   BMI 43.27 kg/m   Physical Exam  Constitutional: She is oriented to person, place, and time. She appears well-developed and well-nourished.  HENT:  Head: Normocephalic.  Neck: Normal range of motion. Neck  supple.  Cardiovascular: Normal rate and regular rhythm.   Pulmonary/Chest: Effort normal and breath sounds normal. She has no wheezes. She has no rales.  Abdominal: Soft. Bowel sounds are normal. There is no tenderness. There is no rebound and no guarding.  Genitourinary:  Genitourinary Comments: No CVA tenderness.   Musculoskeletal: Normal range of motion.  Neurological: She is alert and oriented to person, place, and time.  Skin: Skin is warm and dry. No rash noted.  Psychiatric: She has a normal mood and affect.     ED Treatments / Results  Labs (all labs ordered are listed, but only abnormal results are displayed) Labs Reviewed  COMPREHENSIVE METABOLIC PANEL - Abnormal;  Notable for the following:       Result Value   Glucose, Bld 119 (*)    Total Protein 6.2 (*)    Albumin 3.4 (*)    All other components within normal limits  CBC - Abnormal; Notable for the following:    WBC 11.0 (*)    All other components within normal limits  URINALYSIS, ROUTINE W REFLEX MICROSCOPIC - Abnormal; Notable for the following:    APPearance CLOUDY (*)    Protein, ur 30 (*)    Leukocytes, UA MODERATE (*)    Bacteria, UA RARE (*)    Squamous Epithelial / LPF 6-30 (*)    All other components within normal limits  LIPASE, BLOOD  HCG, QUANTITATIVE, PREGNANCY    EKG  EKG Interpretation None       Radiology No results found.  Procedures Procedures (including critical care time)  Medications Ordered in ED Medications - No data to display   Initial Impression / Assessment and Plan / ED Course  I have reviewed the triage vital signs and the nursing notes.  Pertinent labs & imaging results that were available during my care of the patient were reviewed by me and considered in my medical decision making (see chart for details).     Patient with LUQ abdominal pain and mild dysuria. No infectious process on UA, no hematuria. Abdominal exam is unremarkable/benign. Labs are reassuring. VSS, no fever.   The patient appears stable for discharge home. Will refer to PCP for further outpatient management if pain continues.   Final Clinical Impressions(s) / ED Diagnoses   Final diagnoses:  None   1. LUQ abdominal pain.  New Prescriptions New Prescriptions   No medications on file     Charlann Lange, Hershal Coria 04/01/17 5400    Ezequiel Essex, MD 04/01/17 [phone]

## 2017-07-05 ENCOUNTER — Ambulatory Visit (INDEPENDENT_AMBULATORY_CARE_PROVIDER_SITE_OTHER): Payer: Medicaid Other | Admitting: Obstetrics & Gynecology

## 2017-07-05 ENCOUNTER — Other Ambulatory Visit (HOSPITAL_COMMUNITY)
Admission: RE | Admit: 2017-07-05 | Discharge: 2017-07-05 | Disposition: A | Discharge status: Home / Self Care | Payer: Medicaid Other | Source: Ambulatory Visit | Attending: Obstetrics & Gynecology | Admitting: Obstetrics & Gynecology

## 2017-07-05 ENCOUNTER — Encounter: Payer: Self-pay | Admitting: Obstetrics & Gynecology

## 2017-07-05 VITALS — BP 138/83 | HR 81 | Ht 62.0 in | Wt 280.7 lb

## 2017-07-05 DIAGNOSIS — Z01419 Encounter for gynecological examination (general) (routine) without abnormal findings: Secondary | ICD-10-CM | POA: Diagnosis not present

## 2017-07-05 DIAGNOSIS — Z0000 Encounter for general adult medical examination without abnormal findings: Secondary | ICD-10-CM | POA: Diagnosis not present

## 2017-07-05 DIAGNOSIS — Z711 Person with feared health complaint in whom no diagnosis is made: Secondary | ICD-10-CM

## 2017-07-05 NOTE — Progress Notes (Signed)
Patient is in the office for annual, reports stomach pain that radiated to her vagina a few days ago. Patient reports having panic attacks often.

## 2017-07-05 NOTE — Progress Notes (Signed)
Patient ID: Katherine Douglas, female   DOB: 20-Oct-1980, 36 y.o.   MRN: [id number]  Chief Complaint  Patient presents with  . Gynecologic Exam    HPI Katherine Douglas is a 36 y.o. female.  [id number] Patient's last menstrual period was 06/16/2017. She is not currently sexually active but is considering contraception options.   HPI  Past Medical History:  Diagnosis Date  . Diabetes mellitus without complication (Katherine Douglas)    pt states was gestational with previous pregnancy  . Headache   . Panic attacks   . Pneumonia   . Stress     Past Surgical History:  Procedure Laterality Date  . INDUCED ABORTION      Family History  Problem Relation Age of Onset  . Hypertension Mother   . Heart failure Mother   . Diabetes Father     Social History Social History  Substance Use Topics  . Smoking status: Former Smoker    Packs/day: 0.25    Years: 18.00    Types: Cigarettes    Quit date: 09/15/2014  . Smokeless tobacco: Never Used  . Alcohol use No    Allergies  Allergen Reactions  . Other Anaphylaxis    Crawfish   . Amoxicillin Hives    Has patient had a PCN reaction causing immediate rash, facial/tongue/throat swelling, SOB or lightheadedness with hypotension:unsure Has patient had a PCN reaction causing severe rash involving mucus membranes or skin necrosis:No Has patient had a PCN reaction that required hospitalization:No Has patient had a PCN reaction occurring within the last 10 years:Yes If all of the above answers are "NO", then may proceed with Cephalosporin use.   Marland Kitchen Peanut-Containing Drug Products Itching  . Penicillins Itching    Has patient had a PCN reaction causing immediate rash, facial/tongue/throat swelling, SOB or lightheadedness with hypotension:unsure Has patient had a PCN reaction causing severe rash involving mucus membranes or skin necrosis:No Has patient had a PCN reaction that required hospitalization:No Has patient had a PCN reaction occurring  within the last 10 years:Yes If all of the above answers are "NO", then may proceed with Cephalosporin use.    . Strawberry Extract Hives, Itching and Rash    Current Outpatient Prescriptions  Medication Sig Dispense Refill  . amLODipine (NORVASC) 2.5 MG tablet Take 2.5 mg by mouth daily.    Marland Kitchen aspirin EC 81 MG tablet Take 162 mg by mouth daily as needed for mild pain.     No current facility-administered medications for this visit.     Review of Systems Review of Systems  Constitutional: Positive for unexpected weight change (weight gain).  Genitourinary: Positive for pelvic pain (recent mid cycle pain) and vaginal discharge (vaginal odor).  Neurological: Positive for headaches (premenstrual).  Psychiatric/Behavioral: The patient is nervous/anxious (panic attacks).     Blood pressure 138/83, pulse 81, height 5\' 2"  (1.575 m), weight 280 lb 11.2 oz (127.3 kg), last menstrual period 06/16/2017, not currently breastfeeding.  Physical Exam Physical Exam  Constitutional: She is oriented to person, place, and time. She appears well-developed. No distress.  obese  HENT:  Head: Normocephalic.  Eyes: Pupils are equal, round, and reactive to light.  Cardiovascular: Normal rate.   Pulmonary/Chest: Effort normal.  Genitourinary: Vagina normal and uterus normal. No vaginal discharge found.  Genitourinary Comments: No mass or tenderness  Neurological: She is alert and oriented to person, place, and time.  Skin: Skin is warm and dry.  Psychiatric: She has a normal mood and affect. Her behavior is  normal.  Vitals reviewed. Breasts: breasts appear normal, no suspicious masses, no skin or nipple changes or axillary nodes.   Data Reviewed Medications and vitals  Assessment    Patient Active Problem List   Diagnosis Date Noted  . Morbid obesity (Katherine Douglas) 07/05/2017  . Multiparity 04/14/2015  Well woman exam Requests STD screening     Plan    F/U pap and STD testing  Annual  exam F/U with PCP for BP and anxiety management Discussed LARC including IUD which would not be affected by obesity        Emeterio Reeve 07/05/2017, 4:14 PM

## 2017-07-05 NOTE — Patient Instructions (Signed)

## 2017-07-06 LAB — RPR: RPR: NONREACTIVE

## 2017-07-06 LAB — HEPATITIS C ANTIBODY

## 2017-07-06 LAB — HIV ANTIBODY (ROUTINE TESTING W REFLEX): HIV SCREEN 4TH GENERATION: NONREACTIVE

## 2017-07-06 LAB — HEPATITIS B SURFACE ANTIGEN: Hepatitis B Surface Ag: NEGATIVE

## 2017-07-07 LAB — CERVICOVAGINAL ANCILLARY ONLY
Bacterial vaginitis: POSITIVE — AB
Candida vaginitis: NEGATIVE
Chlamydia: POSITIVE — AB
NEISSERIA GONORRHEA: NEGATIVE
Trichomonas: NEGATIVE

## 2017-07-10 LAB — CYTOLOGY - PAP
DIAGNOSIS: NEGATIVE
HPV: NOT DETECTED

## 2017-07-14 ENCOUNTER — Encounter (HOSPITAL_COMMUNITY): Payer: Self-pay | Admitting: Emergency Medicine

## 2017-07-14 ENCOUNTER — Emergency Department (HOSPITAL_COMMUNITY)
Admission: EM | Admit: 2017-07-14 | Discharge: 2017-07-14 | Disposition: A | Discharge status: Home / Self Care | Payer: No Typology Code available for payment source | Attending: Emergency Medicine | Admitting: Emergency Medicine

## 2017-07-14 DIAGNOSIS — Y999 Unspecified external cause status: Secondary | ICD-10-CM | POA: Diagnosis not present

## 2017-07-14 DIAGNOSIS — E119 Type 2 diabetes mellitus without complications: Secondary | ICD-10-CM | POA: Insufficient documentation

## 2017-07-14 DIAGNOSIS — M25511 Pain in right shoulder: Secondary | ICD-10-CM | POA: Insufficient documentation

## 2017-07-14 DIAGNOSIS — M545 Low back pain: Secondary | ICD-10-CM | POA: Insufficient documentation

## 2017-07-14 DIAGNOSIS — Z79899 Other long term (current) drug therapy: Secondary | ICD-10-CM | POA: Diagnosis not present

## 2017-07-14 DIAGNOSIS — Y9389 Activity, other specified: Secondary | ICD-10-CM | POA: Insufficient documentation

## 2017-07-14 DIAGNOSIS — Z91010 Allergy to peanuts: Secondary | ICD-10-CM | POA: Insufficient documentation

## 2017-07-14 DIAGNOSIS — Y92410 Unspecified street and highway as the place of occurrence of the external cause: Secondary | ICD-10-CM | POA: Insufficient documentation

## 2017-07-14 DIAGNOSIS — F17210 Nicotine dependence, cigarettes, uncomplicated: Secondary | ICD-10-CM | POA: Diagnosis not present

## 2017-07-14 DIAGNOSIS — M7918 Myalgia, other site: Secondary | ICD-10-CM

## 2017-07-14 HISTORY — DX: Depression, unspecified: F32.A

## 2017-07-14 HISTORY — DX: Major depressive disorder, single episode, unspecified: F32.9

## 2017-07-14 MED ORDER — IBUPROFEN 600 MG PO TABS
600.0000 mg | ORAL_TABLET | Freq: Four times a day (QID) | ORAL | 0 refills | Status: DC | PRN
Start: 2017-07-14 — End: 2018-07-16

## 2017-07-14 MED ORDER — CYCLOBENZAPRINE HCL 10 MG PO TABS
10.0000 mg | ORAL_TABLET | Freq: Two times a day (BID) | ORAL | 0 refills | Status: DC | PRN
Start: 2017-07-14 — End: 2019-10-24
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
## 2017-07-18 ENCOUNTER — Telehealth: Payer: Self-pay | Admitting: Pediatrics

## 2017-07-18 ENCOUNTER — Encounter: Payer: Self-pay | Admitting: Pediatrics

## 2017-07-18 MED ORDER — AZITHROMYCIN 1 G PO PACK: 1.0000 g | PACK | Freq: Once | ORAL | 0 refills | Status: AC

## 2017-07-18 NOTE — Telephone Encounter (Signed)
See result note for 10/10 labs.  rx sent, unable to contact patient via phone. Sent MyChart message for patient to contact office in regards to recent results. [place] fax sent.

## 2017-07-18 NOTE — Telephone Encounter (Signed)
-----   Message from Woodroe Mode, MD sent at 07/18/2017  6:45 AM EDT ----- Chlamydia was positive needs treatment with azithromycin 1 g
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
## 2017-07-25 ENCOUNTER — Encounter: Payer: Self-pay | Admitting: *Deleted

## 2017-07-25 NOTE — Telephone Encounter (Signed)
Phone still not accepting incoming calls.

## 2017-07-26 ENCOUNTER — Encounter: Payer: Self-pay | Admitting: Pediatrics

## 2017-07-26 ENCOUNTER — Telehealth: Payer: Self-pay | Admitting: Pediatrics

## 2017-07-26 MED ORDER — AZITHROMYCIN 1 G PO PACK: 1.0000 g | PACK | Freq: Once | ORAL | 0 refills | Status: AC

## 2017-07-26 NOTE — Telephone Encounter (Signed)
See result note-Rx sent, pt notified.

## 2017-07-26 NOTE — Telephone Encounter (Signed)
Phone still not accepting incoming calls. Letter sent in [place] today requesting cb in regards to recent result.

## 2017-07-26 NOTE — Telephone Encounter (Signed)
-----   Message from Woodroe Mode, MD sent at 07/18/2017  6:45 AM EDT ----- Chlamydia was positive needs treatment with azithromycin 1 g

## 2017-07-26 NOTE — Telephone Encounter (Signed)
See result note.  

## 2017-08-01 ENCOUNTER — Other Ambulatory Visit: Payer: Self-pay | Admitting: *Deleted

## 2017-08-01 DIAGNOSIS — A749 Chlamydial infection, unspecified: Secondary | ICD-10-CM

## 2017-08-01 MED ORDER — AZITHROMYCIN 250 MG PO TABS: 1000.0000 mg | ORAL_TABLET | Freq: Once | ORAL | 0 refills | Status: AC

## 2017-08-01 NOTE — Progress Notes (Signed)
Pt called to office stating she took that Azithromycin powder on Friday and vomited before she could get dose completed. Reviewed with Dr Rip Harbour, approved to order Azithromycin tablets to pharmacy. Pt aware.

## 2017-09-21 ENCOUNTER — Emergency Department (HOSPITAL_COMMUNITY): Payer: Medicaid Other

## 2017-09-21 ENCOUNTER — Encounter (HOSPITAL_COMMUNITY): Payer: Self-pay

## 2017-09-21 ENCOUNTER — Emergency Department (HOSPITAL_COMMUNITY)
Admission: EM | Admit: 2017-09-21 | Discharge: 2017-09-21 | Disposition: A | Discharge status: Home / Self Care | Payer: Medicaid Other | Attending: Emergency Medicine | Admitting: Emergency Medicine

## 2017-09-21 DIAGNOSIS — Z79899 Other long term (current) drug therapy: Secondary | ICD-10-CM | POA: Diagnosis not present

## 2017-09-21 DIAGNOSIS — M79605 Pain in left leg: Secondary | ICD-10-CM | POA: Diagnosis not present

## 2017-09-21 DIAGNOSIS — F17210 Nicotine dependence, cigarettes, uncomplicated: Secondary | ICD-10-CM | POA: Insufficient documentation

## 2017-09-21 DIAGNOSIS — E119 Type 2 diabetes mellitus without complications: Secondary | ICD-10-CM | POA: Diagnosis not present

## 2017-09-21 DIAGNOSIS — M79672 Pain in left foot: Secondary | ICD-10-CM | POA: Insufficient documentation

## 2017-09-21 NOTE — ED Notes (Signed)
Patient transported to X-ray 

## 2017-09-21 NOTE — ED Provider Notes (Signed)
[facility] Provider Note   CSN: [id number] Arrival date & time: 09/21/17  0235     History   Chief Complaint No chief complaint on file.   HPI DELBRA ZELLARS is a 36 y.o. female.  The history is provided by the patient and medical records.    36 year old female with history of depression, diabetes, headaches, obesity, presenting to the ED with left leg pain.  Patient reports she had a fall about 1 month ago.  States when it was snowing her dog ran out of the house that she tried to chase after him and fell through their screen door on the front porch landing on some steps.  There was no head injury or loss of consciousness.  States her leg was sore for a few days but she figured it was just normal bruising, etc.  She was never evaluated.  Reports she is continued to have some pain in her left lower leg along the shin and pain in the left foot.  Worse with weightbearing and ambulation.  She denies any numbness or weakness of the legs.  She has not had any new injuries or falls.  No chest pain or shortness of breath.  Past Medical History:  Diagnosis Date  . Depression   . Diabetes mellitus without complication ([place])    pt states was gestational with previous pregnancy  . Headache   . Panic attacks   . Pneumonia   . Stress     Patient Active Problem List   Diagnosis Date Noted  . Morbid obesity ([place]) 07/05/2017  . Multiparity 04/14/2015    Past Surgical History:  Procedure Laterality Date  . INDUCED ABORTION      OB History    Gravida Para Term Preterm AB Living   5 3 3   2 3    SAB TAB Ectopic Multiple Live Births     2   0 3       Home Medications    Prior to Admission medications   Medication Sig Start Date End Date Taking? Authorizing Provider  amLODipine (NORVASC) 10 MG tablet Take 10 mg by mouth daily. 08/24/17  Yes [provider]  Oxycodone HCl 10 MG TABS Take 10 mg by mouth 3 (three) times daily.  09/06/17  Yes [provider]  cyclobenzaprine (FLEXERIL) 10 MG tablet Take 1 tablet (10 mg total) by mouth 2 (two) times daily as needed for muscle spasms. Patient not taking: Reported on 09/21/2017 07/14/17   Charlann Lange, PA-C  ibuprofen (ADVIL,MOTRIN) 600 MG tablet Take 1 tablet (600 mg total) by mouth every 6 (six) hours as needed. Patient not taking: Reported on 09/21/2017 07/14/17   Charlann Lange, PA-C    Family History Family History  Problem Relation Age of Onset  . Hypertension Mother   . Heart failure Mother   . Diabetes Father   . CAD Other     Social History Social History   Tobacco Use  . Smoking status: Current Every Day Smoker    Packs/day: 0.25    Years: 18.00    Pack years: 4.50    Types: Cigarettes    Last attempt to quit: 09/15/2014    Years since quitting: 3.0  . Smokeless tobacco: Never Used  Substance Use Topics  . Alcohol use: Yes  . Drug use: No     Allergies   Other; Amoxicillin; Peanut-containing drug products; Penicillins; and Strawberry extract   Review of Systems Review of Systems  Musculoskeletal: Positive for arthralgias.  All other systems reviewed and are negative.    Physical Exam Updated Vital Signs BP (!) 146/88   Pulse 98   Temp 97.9 F (36.6 C) (Oral)   Resp 20   LMP 09/18/2017   SpO2 100%   Physical Exam  Constitutional: She is oriented to person, place, and time. She appears well-developed and well-nourished.  HENT:  Head: Normocephalic and atraumatic.  Mouth/Throat: Oropharynx is clear and moist.  Eyes: Conjunctivae and EOM are normal. Pupils are equal, round, and reactive to light.  Neck: Normal range of motion.  Cardiovascular: Normal rate, regular rhythm and normal heart sounds.  Pulmonary/Chest: Effort normal and breath sounds normal.  Abdominal: Soft. Bowel sounds are normal.  Musculoskeletal: Normal range of motion.  Tenderness along left lower shin; there is scar along distal shin from prior  injury; there is no calf tenderness or appreciable swelling when compared with right leg; no palpable cords, overlying skin changes, no warmth to touch; DP pulse intact; moving toes normally; normal sensation  Neurological: She is alert and oriented to person, place, and time.  Skin: Skin is warm and dry.  Psychiatric: She has a normal mood and affect.  Nursing note and vitals reviewed.    ED Treatments / Results  Labs (all labs ordered are listed, but only abnormal results are displayed) Labs Reviewed - No data to display  EKG  EKG Interpretation None       Radiology Dg Tibia/fibula Left  Result Date: 09/21/2017 CLINICAL DATA:  36 y/o F; fall injury 1 month ago with persistent pain of the left foot and midshaft of tibia/fibula. EXAM: LEFT TIBIA AND FIBULA - 2 VIEW; LEFT FOOT - COMPLETE 3+ VIEW COMPARISON:  None. FINDINGS: Left tibia and fibula: There is no evidence of fracture or other focal bone lesions. Soft tissues are unremarkable. Left foot: There is no evidence of fracture or other focal bone lesions. Soft tissues are unremarkable. IMPRESSION: Negative. Electronically Signed   By: Kristine Garbe M.D.   On: 09/21/2017 04:23   Dg Foot Complete Left  Result Date: 09/21/2017 CLINICAL DATA:  36 y/o F; fall injury 1 month ago with persistent pain of the left foot and midshaft of tibia/fibula. EXAM: LEFT TIBIA AND FIBULA - 2 VIEW; LEFT FOOT - COMPLETE 3+ VIEW COMPARISON:  None. FINDINGS: Left tibia and fibula: There is no evidence of fracture or other focal bone lesions. Soft tissues are unremarkable. Left foot: There is no evidence of fracture or other focal bone lesions. Soft tissues are unremarkable. IMPRESSION: Negative. Electronically Signed   By: Kristine Garbe M.D.   On: 09/21/2017 04:23    Procedures Procedures (including critical care time)  Medications Ordered in ED Medications - No data to display   Initial Impression / Assessment and Plan / ED  Course  I have reviewed the triage vital signs and the nursing notes.  Pertinent labs & imaging results that were available during my care of the patient were reviewed by me and considered in my medical decision making (see chart for details).  36 y.o. F here with left foot/leg pain since a fall last month.  States she fell through her screen door onto some steps.  Has well healed wound to left lower leg/shin.  No signs of infection.  No bony deformities.  No calf pain, tenderness, swelling, or palpable cords.  No overlying skin changes or warmth to touch.  Compartments soft, easily compressible.  X-rays negative for acute findings.  Low suspicion for DVT.  Placed in ASO brace for foot.  Can follow-up with PCP if any ongoing issues.  Discussed plan with patient, she acknowledged understanding and agreed with plan of care.  Return precautions given for new or worsening symptoms.  Final Clinical Impressions(s) / ED Diagnoses   Final diagnoses:  Left foot pain  Left leg pain    ED Discharge Orders    None       Larene Pickett, PA-C 09/21/17 [id number]    Ripley Fraise, MD 09/21/17 [phone]

## 2017-09-21 NOTE — ED Triage Notes (Signed)
Pt states she has had swollen left leg and foot x 1 month; pt state she had a fall 1 month ago and leg has not felt the same; pt states pain at 7/10 while rested but pain increases as she walks; pt a&ox 4 and able to ambulate to triage room.-Monique,RN

## 2017-09-21 NOTE — Discharge Instructions (Signed)
Your x-rays are normal. Return to the ED for new or worsening symptoms.

## 2017-10-03 ENCOUNTER — Other Ambulatory Visit (HOSPITAL_COMMUNITY)
Admission: RE | Admit: 2017-10-03 | Discharge: 2017-10-03 | Disposition: A | Discharge status: Home / Self Care | Payer: Medicaid Other | Source: Ambulatory Visit | Attending: Obstetrics | Admitting: Obstetrics

## 2017-10-03 ENCOUNTER — Encounter: Payer: Self-pay | Admitting: Obstetrics

## 2017-10-03 ENCOUNTER — Ambulatory Visit (INDEPENDENT_AMBULATORY_CARE_PROVIDER_SITE_OTHER): Payer: Medicaid Other | Admitting: Obstetrics

## 2017-10-03 VITALS — BP 149/97 | HR 90 | Wt 276.4 lb

## 2017-10-03 DIAGNOSIS — E66813 Obesity, class 3: Secondary | ICD-10-CM

## 2017-10-03 DIAGNOSIS — R102 Pelvic and perineal pain unspecified side: Secondary | ICD-10-CM

## 2017-10-03 DIAGNOSIS — F410 Panic disorder [episodic paroxysmal anxiety] without agoraphobia: Secondary | ICD-10-CM | POA: Diagnosis not present

## 2017-10-03 DIAGNOSIS — N946 Dysmenorrhea, unspecified: Secondary | ICD-10-CM | POA: Insufficient documentation

## 2017-10-03 DIAGNOSIS — N944 Primary dysmenorrhea: Secondary | ICD-10-CM | POA: Diagnosis not present

## 2017-10-03 DIAGNOSIS — Z6841 Body Mass Index (BMI) 40.0 and over, adult: Secondary | ICD-10-CM | POA: Diagnosis not present

## 2017-10-03 DIAGNOSIS — N898 Other specified noninflammatory disorders of vagina: Secondary | ICD-10-CM | POA: Diagnosis not present

## 2017-10-03 DIAGNOSIS — E119 Type 2 diabetes mellitus without complications: Secondary | ICD-10-CM | POA: Insufficient documentation

## 2017-10-03 DIAGNOSIS — F17210 Nicotine dependence, cigarettes, uncomplicated: Secondary | ICD-10-CM | POA: Insufficient documentation

## 2017-10-03 DIAGNOSIS — F329 Major depressive disorder, single episode, unspecified: Secondary | ICD-10-CM | POA: Diagnosis not present

## 2017-10-03 MED ORDER — NAPROXEN 500 MG PO TBEC: 500.0000 mg | DELAYED_RELEASE_TABLET | Freq: Two times a day (BID) | ORAL | 11 refills | Status: DC

## 2017-10-03 MED ORDER — OXYCODONE-ACETAMINOPHEN 10-325 MG PO TABS: 1.0000 | ORAL_TABLET | Freq: Four times a day (QID) | ORAL | 0 refills | Status: DC | PRN

## 2017-10-03 NOTE — Progress Notes (Signed)
Presents for [facility], however, patient vomited up the medication and did not pick up new RX.  Complains of lower abdominal & vaginal pain with malodorous, light brown discharge. Denies fever chills, NV.

## 2017-10-03 NOTE — Progress Notes (Signed)
Patient ID: Katherine Douglas, female   DOB: May 16, 1981, 37 y.o.   MRN: [id number]  Chief Complaint  Patient presents with  . Exposure to STD    [facility] CT    HPI Katherine Douglas is a 37 y.o. female.  Presents for Chlamydia TOC.  Also complains of pelvic and vaginal pain.  Also has malodorous vaginal discharge. HPI  Past Medical History:  Diagnosis Date  . Depression   . Diabetes mellitus without complication ([place])    pt states was gestational with previous pregnancy  . Headache   . Panic attacks   . Pneumonia   . Stress     Past Surgical History:  Procedure Laterality Date  . INDUCED ABORTION      Family History  Problem Relation Age of Onset  . Hypertension Mother   . Heart failure Mother   . Diabetes Father   . CAD Other     Social History Social History   Tobacco Use  . Smoking status: Current Every Day Smoker    Packs/day: 0.25    Years: 18.00    Pack years: 4.50    Types: Cigarettes    Last attempt to quit: 09/15/2014    Years since quitting: 3.0  . Smokeless tobacco: Never Used  Substance Use Topics  . Alcohol use: Yes  . Drug use: No    Allergies  Allergen Reactions  . Other Anaphylaxis    Crawfish   . Amoxicillin Hives    Has patient had a PCN reaction causing immediate rash, facial/tongue/throat swelling, SOB or lightheadedness with hypotension:unsure Has patient had a PCN reaction causing severe rash involving mucus membranes or skin necrosis:No Has patient had a PCN reaction that required hospitalization:No Has patient had a PCN reaction occurring within the last 10 years:Yes If all of the above answers are "NO", then may proceed with Cephalosporin use.   Marland Kitchen Peanut-Containing Drug Products Itching  . Penicillins Itching    Has patient had a PCN reaction causing immediate rash, facial/tongue/throat swelling, SOB or lightheadedness with hypotension:unsure Has patient had a PCN reaction causing severe rash involving mucus membranes or skin  necrosis:No Has patient had a PCN reaction that required hospitalization:No Has patient had a PCN reaction occurring within the last 10 years:Yes If all of the above answers are "NO", then may proceed with Cephalosporin use.    . Strawberry Extract Hives, Itching and Rash    Current Outpatient Medications  Medication Sig Dispense Refill  . amLODipine (NORVASC) 10 MG tablet Take 10 mg by mouth daily.  11  . cyclobenzaprine (FLEXERIL) 10 MG tablet Take 1 tablet (10 mg total) by mouth 2 (two) times daily as needed for muscle spasms. (Patient not taking: Reported on 09/21/2017) 20 tablet 0  . ibuprofen (ADVIL,MOTRIN) 600 MG tablet Take 1 tablet (600 mg total) by mouth every 6 (six) hours as needed. (Patient not taking: Reported on 09/21/2017) 30 tablet 0  . naproxen (EC-NAPROSYN) 500 MG EC tablet Take 1 tablet (500 mg total) by mouth 2 (two) times daily with a meal. 30 tablet 11  . Oxycodone HCl 10 MG TABS Take 10 mg by mouth 3 (three) times daily.  0  . oxyCODONE-acetaminophen (PERCOCET) 10-325 MG tablet Take 1 tablet by mouth every 6 (six) hours as needed for pain. 20 tablet 0   No current facility-administered medications for this visit.     Review of Systems Review of Systems Constitutional: negative for fatigue and weight loss Respiratory: negative for cough and  wheezing Cardiovascular: negative for chest pain, fatigue and palpitations Gastrointestinal: negative for abdominal pain and change in bowel habits Genitourinary:positive for pelvic pain and malodorous vaginal discharge Integument/breast: negative for nipple discharge Musculoskeletal:negative for myalgias Neurological: negative for gait problems and tremors Behavioral/Psych: negative for abusive relationship, depression Endocrine: negative for temperature intolerance      Blood pressure (!) 149/97, pulse 90, weight 276 lb 6.4 oz (125.4 kg), last menstrual period 09/18/2017.  Physical Exam Physical Exam            General:  Alert and no distress Abdomen:  normal findings: no organomegaly, soft, non-tender and no hernia  Pelvis:  External genitalia: normal general appearance Urinary system: urethral meatus normal and bladder without fullness, nontender Vaginal: normal without tenderness, induration or masses Cervix: normal appearance Adnexa: normal bimanual exam Uterus: anteverted and non-tender, normal size    50% of 15 min visit spent on counseling and coordination of care.   Data Reviewed Labs  Assessment and Plan:     1. Pelvic pain Rx: - oxyCODONE-acetaminophen (PERCOCET) 10-325 MG tablet; Take 1 tablet by mouth every 6 (six) hours as needed for pain.  Dispense: 20 tablet; Refill: 0 - US PELVIC COMPLETE WITH TRANSVAGINAL; Future  2. Primary dysmenorrhea Rx: - naproxen (EC-NAPROSYN) 500 MG EC tablet; Take 1 tablet (500 mg total) by mouth 2 (two) times daily with a meal.  Dispense: 30 tablet; Refill: 11  3. Vaginal discharge Rx: - Cervicovaginal ancillary only  4. Class 3 severe obesity due to excess calories without serious comorbidity with body mass index (BMI) of 50.0 to 59.9 in adult [facility]) - program of caloric reduction, exercise and behavioral modification recommended    Plan    Follow up in 2 weeks  Orders Placed This Encounter  Procedures  . US PELVIC COMPLETE WITH TRANSVAGINAL    Standing Status:   Future    Standing Expiration Date:   12/02/2018    Order Specific Question:   Reason for Exam (SYMPTOM  OR DIAGNOSIS REQUIRED)    Answer:   Pelvic Pain    Order Specific Question:   Preferred imaging location?    Answer:   [facility]   Meds ordered this encounter  Medications  . oxyCODONE-acetaminophen (PERCOCET) 10-325 MG tablet    Sig: Take 1 tablet by mouth every 6 (six) hours as needed for pain.    Dispense:  20 tablet    Refill:  0  . naproxen (EC-NAPROSYN) 500 MG EC tablet    Sig: Take 1 tablet (500 mg total) by mouth 2 (two) times daily with a meal.     Dispense:  30 tablet    Refill:  11

## 2017-10-04 ENCOUNTER — Other Ambulatory Visit: Payer: Self-pay | Admitting: Obstetrics

## 2017-10-04 DIAGNOSIS — A749 Chlamydial infection, unspecified: Secondary | ICD-10-CM

## 2017-10-04 LAB — CERVICOVAGINAL ANCILLARY ONLY
Bacterial vaginitis: POSITIVE — AB
Chlamydia: POSITIVE — AB
NEISSERIA GONORRHEA: NEGATIVE

## 2017-10-04 MED ORDER — AZITHROMYCIN 500 MG PO TABS: 1000.0000 mg | ORAL_TABLET | Freq: Once | ORAL | 0 refills | Status: AC

## 2017-10-04 MED ORDER — METRONIDAZOLE 500 MG PO TABS: 500.0000 mg | ORAL_TABLET | Freq: Two times a day (BID) | ORAL | 2 refills | Status: DC

## 2017-10-04 MED ORDER — CEFIXIME 400 MG PO CAPS: 400.0000 mg | ORAL_CAPSULE | Freq: Once | ORAL | 0 refills | Status: AC

## 2017-10-10 ENCOUNTER — Ambulatory Visit (HOSPITAL_COMMUNITY): Payer: Medicaid Other | Attending: Obstetrics

## 2017-10-17 ENCOUNTER — Ambulatory Visit: Payer: Medicaid Other | Admitting: Obstetrics

## 2017-10-25 ENCOUNTER — Ambulatory Visit (HOSPITAL_COMMUNITY): Payer: Medicaid Other | Attending: Obstetrics

## 2017-10-26 ENCOUNTER — Ambulatory Visit: Payer: Medicaid Other | Admitting: Obstetrics

## 2017-11-21 ENCOUNTER — Ambulatory Visit: Payer: Medicaid Other | Admitting: Obstetrics and Gynecology

## 2017-11-22 ENCOUNTER — Ambulatory Visit: Payer: Medicaid Other | Admitting: Obstetrics & Gynecology

## 2017-11-23 ENCOUNTER — Ambulatory Visit: Payer: Medicaid Other | Admitting: Obstetrics and Gynecology

## 2018-05-04 ENCOUNTER — Ambulatory Visit: Payer: Medicaid Other | Admitting: Obstetrics

## 2018-07-13 ENCOUNTER — Ambulatory Visit: Payer: Medicaid Other | Admitting: Obstetrics

## 2018-07-16 ENCOUNTER — Encounter: Payer: Self-pay | Admitting: Obstetrics

## 2018-07-16 ENCOUNTER — Ambulatory Visit: Payer: Medicaid Other | Admitting: Obstetrics

## 2018-07-16 ENCOUNTER — Other Ambulatory Visit (HOSPITAL_COMMUNITY)
Admission: RE | Admit: 2018-07-16 | Discharge: 2018-07-16 | Disposition: A | Discharge status: Home / Self Care | Payer: Medicaid Other | Source: Ambulatory Visit | Attending: Obstetrics | Admitting: Obstetrics

## 2018-07-16 ENCOUNTER — Other Ambulatory Visit: Payer: Self-pay

## 2018-07-16 VITALS — BP 120/76 | HR 107 | Ht 62.0 in | Wt 293.0 lb

## 2018-07-16 DIAGNOSIS — Z01419 Encounter for gynecological examination (general) (routine) without abnormal findings: Secondary | ICD-10-CM | POA: Insufficient documentation

## 2018-07-16 DIAGNOSIS — R102 Pelvic and perineal pain: Secondary | ICD-10-CM

## 2018-07-16 DIAGNOSIS — Z0000 Encounter for general adult medical examination without abnormal findings: Secondary | ICD-10-CM

## 2018-07-16 DIAGNOSIS — Z3042 Encounter for surveillance of injectable contraceptive: Secondary | ICD-10-CM

## 2018-07-16 DIAGNOSIS — Z113 Encounter for screening for infections with a predominantly sexual mode of transmission: Secondary | ICD-10-CM

## 2018-07-16 DIAGNOSIS — F17210 Nicotine dependence, cigarettes, uncomplicated: Secondary | ICD-10-CM

## 2018-07-16 DIAGNOSIS — N939 Abnormal uterine and vaginal bleeding, unspecified: Secondary | ICD-10-CM

## 2018-07-16 DIAGNOSIS — N731 Chronic parametritis and pelvic cellulitis: Secondary | ICD-10-CM

## 2018-07-16 DIAGNOSIS — N898 Other specified noninflammatory disorders of vagina: Secondary | ICD-10-CM

## 2018-07-16 DIAGNOSIS — Z6841 Body Mass Index (BMI) 40.0 and over, adult: Secondary | ICD-10-CM

## 2018-07-16 MED ORDER — KETOROLAC TROMETHAMINE 60 MG/2ML IM SOLN
60.0000 mg | Freq: Once | INTRAMUSCULAR | Status: AC
  Administered 2018-07-16: 60 mg via INTRAMUSCULAR

## 2018-07-16 MED ORDER — IBUPROFEN 800 MG PO TABS: 800.0000 mg | ORAL_TABLET | Freq: Three times a day (TID) | ORAL | 5 refills | Status: DC | PRN

## 2018-07-16 MED ORDER — DOXYCYCLINE HYCLATE 100 MG PO CAPS: 100.0000 mg | ORAL_CAPSULE | Freq: Two times a day (BID) | ORAL | 0 refills | Status: DC

## 2018-07-16 MED ORDER — METRONIDAZOLE 500 MG PO TABS
500.0000 mg | ORAL_TABLET | Freq: Two times a day (BID) | ORAL | 0 refills | Status: DC
Start: 2018-07-16 — End: 2019-10-24

## 2018-07-16 NOTE — Progress Notes (Signed)
Subjective:   leeding     Katherine Douglas is a 37 y.o. female here for a routine exam.  Current complaints: Vaginal bleeding and spotting since starting Depo Provera injections in April 2019.  Has had pelvic pain for 5-6 months.  Personal health questionnaire:  Is patient Ashkenazi Jewish, have a family history of breast and/or ovarian cancer: no Is there a family history of uterine cancer diagnosed at age < 29, gastrointestinal cancer, urinary tract cancer, family member who is a Field seismologist syndrome-associated carrier: no Is the patient overweight and hypertensive, family history of diabetes, personal history of gestational diabetes, preeclampsia or PCOS: no Is patient over 59, have PCOS,  family history of premature CHD under age 76, diabetes, smoke, have hypertension or peripheral artery disease:  no At any time, has a partner hit, kicked or otherwise hurt or frightened you?: no Over the past 2 weeks, have you felt down, depressed or hopeless?: no Over the past 2 weeks, have you felt little interest or pleasure in doing things?:no   Gynecologic History No LMP recorded (lmp unknown). Contraception: Depo-Provera injections Last Pap: 2018. Results were: normal Last mammogram: n/a. Results were: n/a  Obstetric History OB History  Gravida Para Term Preterm AB Living  5 3 3   2 3   SAB TAB Ectopic Multiple Live Births    2   0 3    # Outcome Date GA Lbr Len/2nd Weight Sex Delivery Anes PTL Lv  5 Term 04/15/15 [redacted]w[redacted]d 09:28 / 00:26 6 lb 11 oz (3.033 kg) F Vag-Spont EPI  LIV  4 Term 06/15/06 [redacted]w[redacted]d  6 lb 9 oz (2.977 kg) M Vag-Spont EPI N LIV  3 TAB 02/2003          2 TAB 09/2002     TAB     1 Term 09/25/00 [redacted]w[redacted]d  6 lb 13 oz (3.09 kg) F Vag-Spont EPI N LIV    Past Medical History:  Diagnosis Date  . Depression   . Diabetes mellitus without complication ([place])    pt states was gestational with previous pregnancy  . Headache   . Panic attacks   . Pneumonia   . Stress     Past  Surgical History:  Procedure Laterality Date  . INDUCED ABORTION       Current Outpatient Medications:  .  atenolol-chlorthalidone (TENORETIC) 50-25 MG tablet, Take 1 tablet by mouth daily., Disp: , Rfl:  .  amLODipine (NORVASC) 10 MG tablet, Take 10 mg by mouth daily., Disp: , Rfl: 11 .  cyclobenzaprine (FLEXERIL) 10 MG tablet, Take 1 tablet (10 mg total) by mouth 2 (two) times daily as needed for muscle spasms. (Patient not taking: Reported on 09/21/2017), Disp: 20 tablet, Rfl: 0 .  doxycycline (VIBRAMYCIN) 100 MG capsule, Take 1 capsule (100 mg total) by mouth 2 (two) times daily., Disp: 28 capsule, Rfl: 0 .  ibuprofen (ADVIL,MOTRIN) 800 MG tablet, Take 1 tablet (800 mg total) by mouth every 8 (eight) hours as needed., Disp: 30 tablet, Rfl: 5 .  metroNIDAZOLE (FLAGYL) 500 MG tablet, Take 1 tablet (500 mg total) by mouth 2 (two) times daily. (Patient not taking: Reported on 07/16/2018), Disp: 14 tablet, Rfl: 2 .  metroNIDAZOLE (FLAGYL) 500 MG tablet, Take 1 tablet (500 mg total) by mouth 2 (two) times daily., Disp: 28 tablet, Rfl: 0 .  naproxen (EC-NAPROSYN) 500 MG EC tablet, Take 1 tablet (500 mg total) by mouth 2 (two) times daily with a meal. (Patient not taking:  Reported on 07/16/2018), Disp: 30 tablet, Rfl: 11 .  Oxycodone HCl 10 MG TABS, Take 10 mg by mouth 3 (three) times daily., Disp: , Rfl: 0 .  oxyCODONE-acetaminophen (PERCOCET) 10-325 MG tablet, Take 1 tablet by mouth every 6 (six) hours as needed for pain. (Patient not taking: Reported on 07/16/2018), Disp: 20 tablet, Rfl: 0  Current Facility-Administered Medications:  .  ketorolac (TORADOL) injection 60 mg, 60 mg, Intramuscular, Once, Shelly Bombard, MD Allergies  Allergen Reactions  . Other Anaphylaxis    Crawfish   . Amoxicillin Hives    Has patient had a PCN reaction causing immediate rash, facial/tongue/throat swelling, SOB or lightheadedness with hypotension:unsure Has patient had a PCN reaction causing severe rash  involving mucus membranes or skin necrosis:No Has patient had a PCN reaction that required hospitalization:No Has patient had a PCN reaction occurring within the last 10 years:Yes If all of the above answers are "NO", then may proceed with Cephalosporin use.   Marland Kitchen Peanut-Containing Drug Products Itching  . Penicillins Itching    Has patient had a PCN reaction causing immediate rash, facial/tongue/throat swelling, SOB or lightheadedness with hypotension:unsure Has patient had a PCN reaction causing severe rash involving mucus membranes or skin necrosis:No Has patient had a PCN reaction that required hospitalization:No Has patient had a PCN reaction occurring within the last 10 years:Yes If all of the above answers are "NO", then may proceed with Cephalosporin use.    . Strawberry Extract Hives, Itching and Rash    Social History   Tobacco Use  . Smoking status: Current Every Day Smoker    Packs/day: 0.25    Years: 18.00    Pack years: 4.50    Types: Cigarettes    Last attempt to quit: 09/15/2014    Years since quitting: 3.8  . Smokeless tobacco: Never Used  Substance Use Topics  . Alcohol use: Yes    Family History  Problem Relation Age of Onset  . Hypertension Mother   . Heart failure Mother   . Diabetes Father   . CAD Other       Review of Systems  Constitutional: negative for fatigue and weight loss Respiratory: negative for cough and wheezing Cardiovascular: negative for chest pain, fatigue and palpitations Gastrointestinal: positive for abdominal pain and negative for change in bowel habits Musculoskeletal:negative for myalgias Neurological: negative for gait problems and tremors Behavioral/Psych: negative for abusive relationship, depression Endocrine: negative for temperature intolerance    Genitourinary:positive for abnormal menstrual periods, pelvic pain and malodorous vaginal discharge Integument/breast: negative for breast lump, breast tenderness, nipple  discharge and skin lesion(s)    Objective:       BP 120/76   Pulse (!) 107   Ht 5\' 2"  (1.575 m)   Wt 293 lb (132.9 kg)   LMP  (LMP Unknown)   BMI 53.59 kg/m  General:   alert  Skin:   no rash or abnormalities  Lungs:   clear to auscultation bilaterally  Heart:   regular rate and rhythm, S1, S2 normal, no murmur, click, rub or gallop  Breasts:   normal without suspicious masses, skin or nipple changes or axillary nodes  Abdomen:  normal findings: no organomegaly, soft, non-tender and no hernia  Pelvis:  External genitalia: normal general appearance Urinary system: urethral meatus normal and bladder without fullness, nontender Vaginal: normal without tenderness, induration or masses Cervix: normal appearance Adnexa: normal bimanual exam Uterus: anteverted and non-tender, normal size   Lab Review Urine pregnancy test Labs reviewed  yes Radiologic studies reviewed no  50% of 20 min visit spent on counseling and coordination of care.   Assessment:     1. Encounter for routine gynecological examination with Papanicolaou smear of cervix Rx: - Cytology - PAP( [place])  2. Vaginal discharge Rx: - Cervicovaginal ancillary only( [place])  3. Abnormal uterine bleeding (AUB) - Hormonal Imbalance - stop Depo Provera injections  4. Pelvic pain Rx: - US PELVIC COMPLETE WITH TRANSVAGINAL; Future - ibuprofen (ADVIL,MOTRIN) 800 MG tablet; Take 1 tablet (800 mg total) by mouth every 8 (eight) hours as needed.  Dispense: 30 tablet; Refill: 5 - ketorolac (TORADOL) injection 60 mg  5. Chronic PID (chronic pelvic inflammatory disease) Rx: - doxycycline (VIBRAMYCIN) 100 MG capsule; Take 1 capsule (100 mg total) by mouth 2 (two) times daily.  Dispense: 28 capsule; Refill: 0 - metroNIDAZOLE (FLAGYL) 500 MG tablet; Take 1 tablet (500 mg total) by mouth 2 (two) times daily.  Dispense: 28 tablet; Refill: 0  6. Class 3 severe obesity due to excess calories without serious comorbidity  with body mass index (BMI) of 50.0 to 59.9 in adult [facility]) - program of caloric restriction, exercise and behavioral modification recommended  7. Screening for STD (sexually transmitted disease) Rx: - HIV antibody (with reflex) - RPR - Hepatitis C antibody - Hepatitis B surface antigen  8. Encounter for surveillance of injectable contraceptive - AUB with Depo - stop Depo  9. Tobacco dependence due to cigarettes - tobacco cessation recommended with the aid of medication and behavioral modification    Plan:    Education reviewed: calcium supplements, depression evaluation, low fat, low cholesterol diet, safe sex/STD prevention, self breast exams, smoking cessation and weight bearing exercise. Contraception: abstinence. Follow up in: 2 weeks.   Meds ordered this encounter  Medications  . ibuprofen (ADVIL,MOTRIN) 800 MG tablet    Sig: Take 1 tablet (800 mg total) by mouth every 8 (eight) hours as needed.    Dispense:  30 tablet    Refill:  5  . doxycycline (VIBRAMYCIN) 100 MG capsule    Sig: Take 1 capsule (100 mg total) by mouth 2 (two) times daily.    Dispense:  28 capsule    Refill:  0  . metroNIDAZOLE (FLAGYL) 500 MG tablet    Sig: Take 1 tablet (500 mg total) by mouth 2 (two) times daily.    Dispense:  28 tablet    Refill:  0  . ketorolac (TORADOL) injection 60 mg   Orders Placed This Encounter  Procedures  . US PELVIC COMPLETE WITH TRANSVAGINAL    Standing Status:   Future    Standing Expiration Date:   09/16/2019    Order Specific Question:   Reason for Exam (SYMPTOM  OR DIAGNOSIS REQUIRED)    Answer:   Pelvic pain    Order Specific Question:   Preferred imaging location?    Answer:   [facility]  . HIV antibody (with reflex)  . RPR  . Hepatitis C antibody  . Hepatitis B surface antigen    Shelly Bombard MD 07-16-2018

## 2018-07-16 NOTE — Progress Notes (Addendum)
Presents for AEX/PAP.  C/o bleeding getting DEPO in April, abdominal pain 6-8/10 x 5 months.   Requested  STD/HIV testing.  Administrations This Visit    ketorolac (TORADOL) injection 60 mg    Admin Date 07/16/2018 Action Given Dose 60 mg Route Intramuscular Administered By Tamela Oddi, RMA

## 2018-07-17 LAB — HEPATITIS C ANTIBODY: Hep C Virus Ab: <0.1 s/co ratio (ref 0.0–0.9)

## 2018-07-17 LAB — CERVICOVAGINAL ANCILLARY ONLY
BACTERIAL VAGINITIS: POSITIVE — AB
Candida vaginitis: NEGATIVE
Chlamydia: NEGATIVE
NEISSERIA GONORRHEA: NEGATIVE
TRICH (WINDOWPATH): NEGATIVE

## 2018-07-17 LAB — RPR: RPR Ser Ql: NONREACTIVE

## 2018-07-17 LAB — HIV ANTIBODY (ROUTINE TESTING W REFLEX): HIV Screen 4th Generation wRfx: NONREACTIVE

## 2018-07-17 LAB — HEPATITIS B SURFACE ANTIGEN: Hepatitis B Surface Ag: NEGATIVE

## 2018-07-18 ENCOUNTER — Other Ambulatory Visit: Payer: Self-pay | Admitting: Obstetrics

## 2018-07-18 LAB — CYTOLOGY - PAP
Diagnosis: NEGATIVE
HPV: NOT DETECTED

## 2018-07-19 ENCOUNTER — Telehealth: Payer: Self-pay

## 2018-07-19 NOTE — Telephone Encounter (Signed)
Returned call and pt stated that she figured out everything.

## 2018-07-20 ENCOUNTER — Ambulatory Visit (HOSPITAL_COMMUNITY)
Admission: RE | Admit: 2018-07-20 | Discharge: 2018-07-20 | Disposition: A | Discharge status: Home / Self Care | Payer: Medicaid Other | Source: Ambulatory Visit | Attending: Obstetrics | Admitting: Obstetrics

## 2018-07-20 DIAGNOSIS — R102 Pelvic and perineal pain: Secondary | ICD-10-CM | POA: Diagnosis present

## 2018-07-20 DIAGNOSIS — D259 Leiomyoma of uterus, unspecified: Secondary | ICD-10-CM | POA: Diagnosis not present

## 2018-07-23 ENCOUNTER — Ambulatory Visit: Payer: Medicaid Other | Admitting: Obstetrics

## 2018-07-30 ENCOUNTER — Ambulatory Visit: Payer: Medicaid Other | Admitting: Obstetrics

## 2018-08-07 ENCOUNTER — Ambulatory Visit: Payer: Medicaid Other | Admitting: Obstetrics

## 2018-08-07 ENCOUNTER — Encounter: Payer: Self-pay | Admitting: Obstetrics

## 2018-08-07 VITALS — BP 143/95 | HR 107 | Wt 297.0 lb

## 2018-08-07 DIAGNOSIS — Z6841 Body Mass Index (BMI) 40.0 and over, adult: Secondary | ICD-10-CM

## 2018-08-07 DIAGNOSIS — N946 Dysmenorrhea, unspecified: Secondary | ICD-10-CM | POA: Diagnosis not present

## 2018-08-07 DIAGNOSIS — N939 Abnormal uterine and vaginal bleeding, unspecified: Secondary | ICD-10-CM | POA: Diagnosis not present

## 2018-08-07 MED ORDER — PHENTERMINE HCL 37.5 MG PO CAPS: 37.5000 mg | ORAL_CAPSULE | ORAL | 2 refills | Status: DC

## 2018-08-07 MED ORDER — TRAMADOL HCL 50 MG PO TABS: 50.0000 mg | ORAL_TABLET | Freq: Four times a day (QID) | ORAL | 0 refills | Status: DC | PRN

## 2018-08-07 NOTE — Progress Notes (Signed)
RGYN pt presents for f/u visit today to discuss U/S results from 07/20/18.pt states she is still having pelvic pain no relief w/ IBP. Pt states she had to take 2 days out of work due to pain.

## 2018-08-08 NOTE — Progress Notes (Signed)
Patient ID: Katherine Douglas, female   DOB: 05-22-1981, 37 y.o.   MRN: [id number]  Chief Complaint  Patient presents with  . Follow-up    HPI Katherine Douglas is a 37 y.o. female.  History of painful and heavy periods. HPI  Past Medical History:  Diagnosis Date  . Depression   . Diabetes mellitus without complication ([place])    pt states was gestational with previous pregnancy  . Headache   . Panic attacks   . Pneumonia   . Stress     Past Surgical History:  Procedure Laterality Date  . INDUCED ABORTION      Family History  Problem Relation Age of Onset  . Hypertension Mother   . Heart failure Mother   . Diabetes Father   . CAD Other     Social History Social History   Tobacco Use  . Smoking status: Current Every Day Smoker    Packs/day: 0.25    Years: 18.00    Pack years: 4.50    Types: Cigarettes    Last attempt to quit: 09/15/2014    Years since quitting: 3.8  . Smokeless tobacco: Never Used  Substance Use Topics  . Alcohol use: Yes  . Drug use: No    Frequency: 4.0 times per week    Allergies  Allergen Reactions  . Other Anaphylaxis    Crawfish   . Amoxicillin Hives    Has patient had a PCN reaction causing immediate rash, facial/tongue/throat swelling, SOB or lightheadedness with hypotension:unsure Has patient had a PCN reaction causing severe rash involving mucus membranes or skin necrosis:No Has patient had a PCN reaction that required hospitalization:No Has patient had a PCN reaction occurring within the last 10 years:Yes If all of the above answers are "NO", then may proceed with Cephalosporin use.   Marland Kitchen Peanut-Containing Drug Products Itching  . Penicillins Itching    Has patient had a PCN reaction causing immediate rash, facial/tongue/throat swelling, SOB or lightheadedness with hypotension:unsure Has patient had a PCN reaction causing severe rash involving mucus membranes or skin necrosis:No Has patient had a PCN reaction that required  hospitalization:No Has patient had a PCN reaction occurring within the last 10 years:Yes If all of the above answers are "NO", then may proceed with Cephalosporin use.    . Strawberry Extract Hives, Itching and Rash    Current Outpatient Medications  Medication Sig Dispense Refill  . ibuprofen (ADVIL,MOTRIN) 800 MG tablet Take 1 tablet (800 mg total) by mouth every 8 (eight) hours as needed. 30 tablet 5  . amLODipine (NORVASC) 10 MG tablet Take 10 mg by mouth daily.  11  . atenolol-chlorthalidone (TENORETIC) 50-25 MG tablet Take 1 tablet by mouth daily.    . cyclobenzaprine (FLEXERIL) 10 MG tablet Take 1 tablet (10 mg total) by mouth 2 (two) times daily as needed for muscle spasms. (Patient not taking: Reported on 09/21/2017) 20 tablet 0  . doxycycline (VIBRAMYCIN) 100 MG capsule Take 1 capsule (100 mg total) by mouth 2 (two) times daily. 28 capsule 0  . metroNIDAZOLE (FLAGYL) 500 MG tablet Take 1 tablet (500 mg total) by mouth 2 (two) times daily. (Patient not taking: Reported on 07/16/2018) 14 tablet 2  . metroNIDAZOLE (FLAGYL) 500 MG tablet Take 1 tablet (500 mg total) by mouth 2 (two) times daily. (Patient not taking: Reported on 08/07/2018) 28 tablet 0  . naproxen (EC-NAPROSYN) 500 MG EC tablet Take 1 tablet (500 mg total) by mouth 2 (two) times daily with a  meal. (Patient not taking: Reported on 07/16/2018) 30 tablet 11  . Oxycodone HCl 10 MG TABS Take 10 mg by mouth 3 (three) times daily.  0  . oxyCODONE-acetaminophen (PERCOCET) 10-325 MG tablet Take 1 tablet by mouth every 6 (six) hours as needed for pain. (Patient not taking: Reported on 07/16/2018) 20 tablet 0  . phentermine 37.5 MG capsule Take 1 capsule (37.5 mg total) by mouth every morning. 30 capsule 2  . traMADol (ULTRAM) 50 MG tablet Take 1 tablet (50 mg total) by mouth every 6 (six) hours as needed. 30 tablet 0   No current facility-administered medications for this visit.     Review of Systems Review of  Systems Constitutional: negative for fatigue and weight loss Respiratory: negative for cough and wheezing Cardiovascular: negative for chest pain, fatigue and palpitations Gastrointestinal: negative for abdominal pain and change in bowel habits Genitourinary:  POSITIVE for painful, heavy periods Integument/breast: negative for nipple discharge Musculoskeletal:negative for myalgias Neurological: negative for gait problems and tremors Behavioral/Psych: negative for abusive relationship, depression Endocrine: negative for temperature intolerance      Blood pressure (!) 143/95, pulse (!) 107, weight 297 lb (134.7 kg).  Physical Exam Physical Exam:  Deferred  50% of 15 min visit spent on counseling and coordination of care.   Data Reviewed Ultrasound  AsUS PELVIC COMPLETE WITH TRANSVAGINAL (Accession [id number]) (Order [id number])  Imaging  Date: 07/20/2018 Department: [facility] Released By: Adelene Amas, NT Authorizing: Shelly Bombard, MD  Exam Information   Status Exam Begun  Exam Ended   Final [99] 07/20/2018 2:02 PM 07/20/2018 2:42 PM  PACS Images   Show images for US PELVIC COMPLETE WITH TRANSVAGINAL  Study Result   CLINICAL DATA:  Pelvic pain for 5-6 months. Abnormal uterine bleeding. History of chronic PID. Morbid obesity. Patient takes Depo-Provera.  EXAM: TRANSABDOMINAL AND TRANSVAGINAL ULTRASOUND OF PELVIS  TECHNIQUE: Both transabdominal and transvaginal ultrasound examinations of the pelvis were performed. Transabdominal technique was performed for global imaging of the pelvis including uterus, ovaries, adnexal regions, and pelvic cul-de-sac. It was necessary to proceed with endovaginal exam following the transabdominal exam to visualize the uterus, endometrium, ovaries, adnexal regions.  COMPARISON:  None  FINDINGS: Uterus  Measurements: At least 9.1 x 4.9 x 6.1 centimeters. Numerous solid uterine masses  are present. Solid heterogeneous mass is  A new posterior RIGHT 4.0 x 3.0 x 4.2 centimeters.  A LEFT LATERAL uterine mass is 2.4 x 2.7 x 3.2 centimeters.  A small LEFT LOWER uterine segment mass is 1.9 x 1.7 x 1.2 centimeters.  A small fundal mass is 0.9 x 0.6 x 1.0 centimeters.  Endometrium  Thickness: 5.9 millimeter. Its endometrium is displaced by fibroids. No definite endometrial lesions identified.  Right ovary  Measurements: 3.2 x 1.7 x 1.6 centimeters. Normal appearance/no adnexal mass.  Left ovary  Measurements: 4.0 x 2.4 x 1.6 centimeters. Normal appearance/no adnexal mass.  Other findings  No abnormal free fluid.  IMPRESSION: 1. Multiple uterine fibroids, largest measuring approximately 4 centimeters in diameter. 2. There is displacement of the endometrium without evidence for endometrial mass. Endometrial thickness is normal. If bleeding remains unresponsive to hormonal or medical therapy, sonohysterogram should be considered for focal lesion work-up. (Ref: Radiological Reasoning: Algorithmic Workup of Abnormal Vaginal Bleeding with Endovaginal Sonography and Sonohysterography. AJR 2008; 517:O16-07) 3. Normal appearance of both ovaries.   Electronically Signed   By: Nolon Nations M.D.   On: 07/20/2018 14:54    Assessment  1. Dysmenorrhea Rx: - traMADol (ULTRAM) 50 MG tablet; Take 1 tablet (50 mg total) by mouth every 6 (six) hours as needed.  Dispense: 30 tablet; Refill: 0  2. Abnormal uterine bleeding (AUB)  3. Class 3 severe obesity due to excess calories without serious comorbidity with body mass index (BMI) of 50.0 to 59.9 in adult [facility]) Rx: - phentermine 37.5 MG capsule; Take 1 capsule (37.5 mg total) by mouth every morning.  Dispense: 30 capsule; Refill: 2    Plan    Continue Ibuprofen / Tramadol prn Follow up in 6 months   No orders of the defined types were placed in this encounter.  Meds ordered this encounter   Medications  . traMADol (ULTRAM) 50 MG tablet    Sig: Take 1 tablet (50 mg total) by mouth every 6 (six) hours as needed.    Dispense:  30 tablet    Refill:  0  . phentermine 37.5 MG capsule    Sig: Take 1 capsule (37.5 mg total) by mouth every morning.    Dispense:  30 capsule    Refill:  2    Shelly Bombard MD 08-08-2018

## 2018-12-06 ENCOUNTER — Other Ambulatory Visit: Payer: Self-pay

## 2018-12-06 ENCOUNTER — Emergency Department (HOSPITAL_COMMUNITY)
Admission: EM | Admit: 2018-12-06 | Discharge: 2018-12-06 | Disposition: A | Discharge status: Home / Self Care | Payer: Medicaid Other | Attending: Emergency Medicine | Admitting: Emergency Medicine

## 2018-12-06 DIAGNOSIS — R200 Anesthesia of skin: Secondary | ICD-10-CM | POA: Insufficient documentation

## 2018-12-06 DIAGNOSIS — R0789 Other chest pain: Secondary | ICD-10-CM | POA: Diagnosis not present

## 2018-12-06 DIAGNOSIS — R0602 Shortness of breath: Secondary | ICD-10-CM | POA: Insufficient documentation

## 2018-12-06 DIAGNOSIS — Z5321 Procedure and treatment not carried out due to patient leaving prior to being seen by health care provider: Secondary | ICD-10-CM | POA: Insufficient documentation

## 2018-12-06 DIAGNOSIS — R0989 Other specified symptoms and signs involving the circulatory and respiratory systems: Secondary | ICD-10-CM | POA: Diagnosis not present

## 2018-12-06 LAB — CBC
HCT: 44.4 % (ref 36.0–46.0)
Hemoglobin: 13.5 g/dL (ref 12.0–15.0)
MCH: 27.8 pg (ref 26.0–34.0)
MCHC: 30.4 g/dL (ref 30.0–36.0)
MCV: 91.4 fL (ref 80.0–100.0)
Platelets: 382 10*3/uL (ref 150–400)
RBC: 4.86 MIL/uL (ref 3.87–5.11)
RDW: 15.3 % (ref 11.5–15.5)
WBC: 11 10*3/uL — ABNORMAL HIGH (ref 4.0–10.5)
nRBC: 0 % (ref 0.0–0.2)

## 2018-12-06 LAB — COMPREHENSIVE METABOLIC PANEL
ALT: 30 U/L (ref 0–44)
AST: 28 U/L (ref 15–41)
Albumin: 3.3 g/dL — ABNORMAL LOW (ref 3.5–5.0)
Alkaline Phosphatase: 90 U/L (ref 38–126)
Anion gap: 9 (ref 5–15)
BUN: 14 mg/dL (ref 6–20)
CO2: 27 mmol/L (ref 22–32)
Calcium: 9.5 mg/dL (ref 8.9–10.3)
Chloride: 101 mmol/L (ref 98–111)
Creatinine, Ser: 0.86 mg/dL (ref 0.44–1.00)
GFR calc Af Amer: >60 mL/min (ref >60)
GFR calc non Af Amer: >60 mL/min (ref >60)
Glucose, Bld: 139 mg/dL — ABNORMAL HIGH (ref 70–99)
Potassium: 3.8 mmol/L (ref 3.5–5.1)
Sodium: 137 mmol/L (ref 135–145)
Total Bilirubin: 0.4 mg/dL (ref 0.3–1.2)
Total Protein: 6.8 g/dL (ref 6.5–8.1)

## 2018-12-06 NOTE — ED Triage Notes (Signed)
Patient called for room, no answer x 1.

## 2018-12-06 NOTE — ED Notes (Signed)
Called pt x3 for vitals. No answer.

## 2018-12-06 NOTE — ED Triage Notes (Signed)
Patient called for room again, no answer. Not seen in waiting area.

## 2018-12-06 NOTE — ED Notes (Signed)
Called pt x3 for room. No answer. 

## 2018-12-06 NOTE — ED Triage Notes (Signed)
See downtime paperwork.

## 2019-03-24 IMAGING — DX DG FOOT COMPLETE 3+V*L*
3 series · 3 of 3 positions shown · non-contrast
Comparison: None.

CLINICAL DATA: 36 y/o F; fall injury 1 month ago with persistent
pain of the left foot and midshaft of tibia/fibula.

EXAM:
LEFT TIBIA AND FIBULA - 2 VIEW; LEFT FOOT - COMPLETE 3+ VIEW

[foot ap]
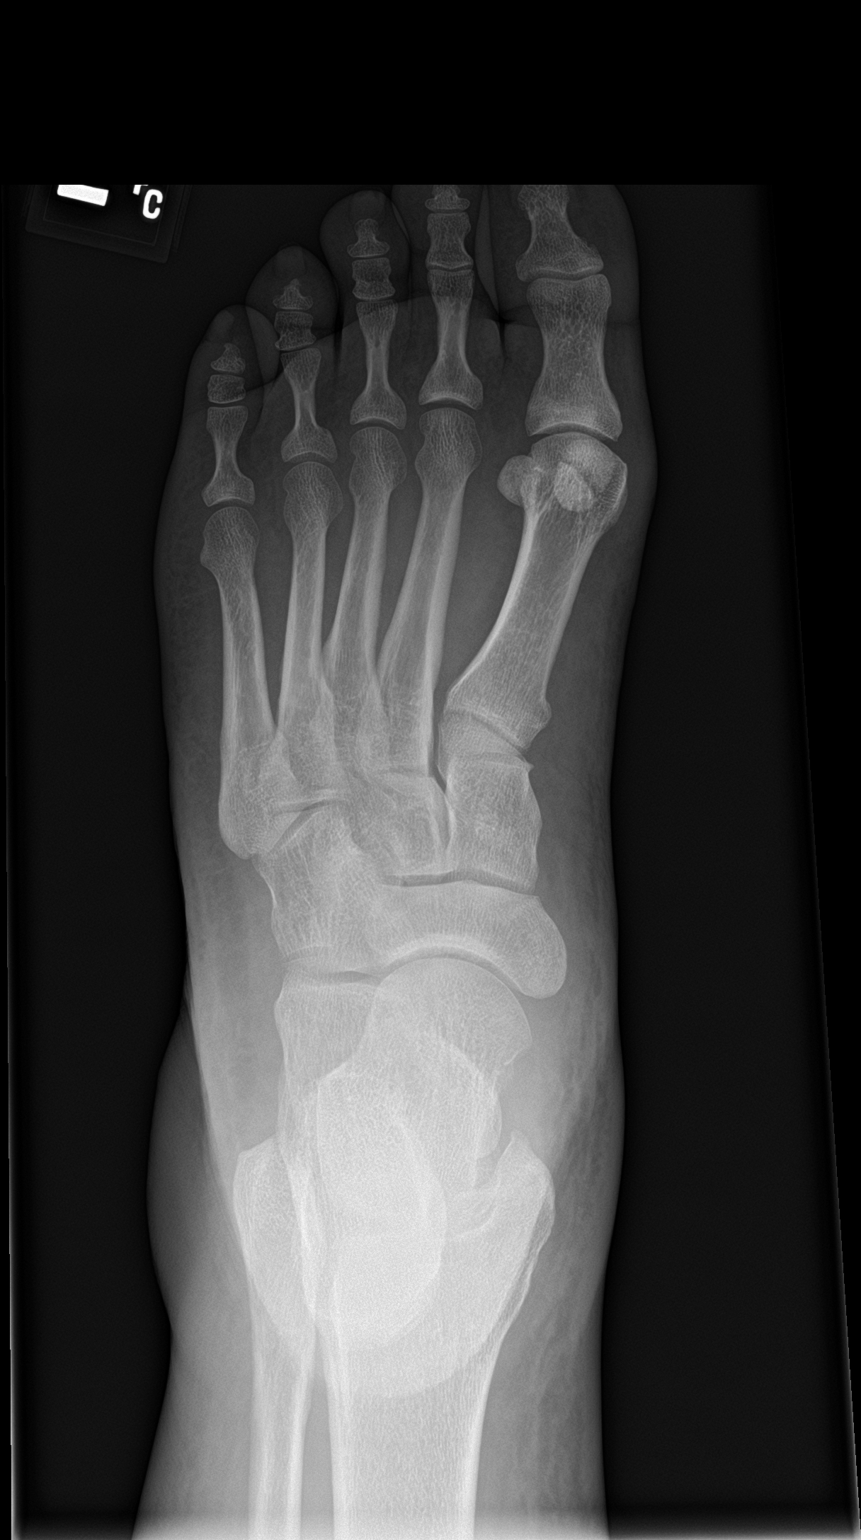

[foot obl]
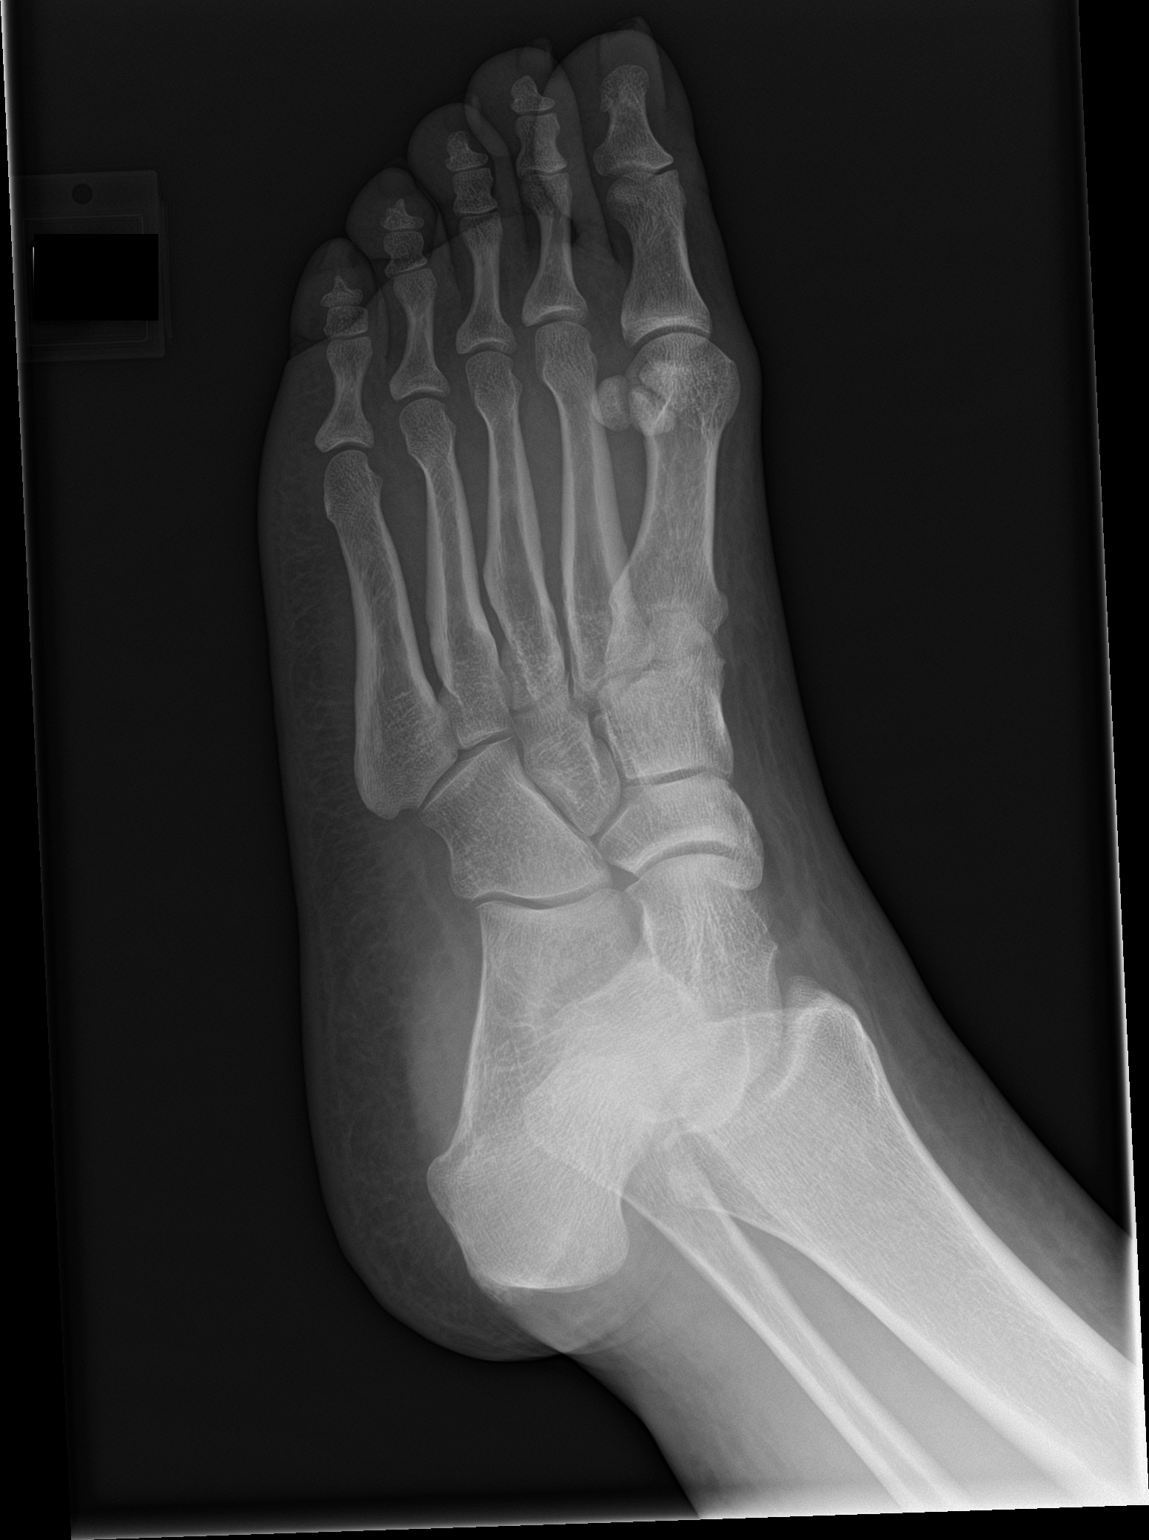

[foot lat]
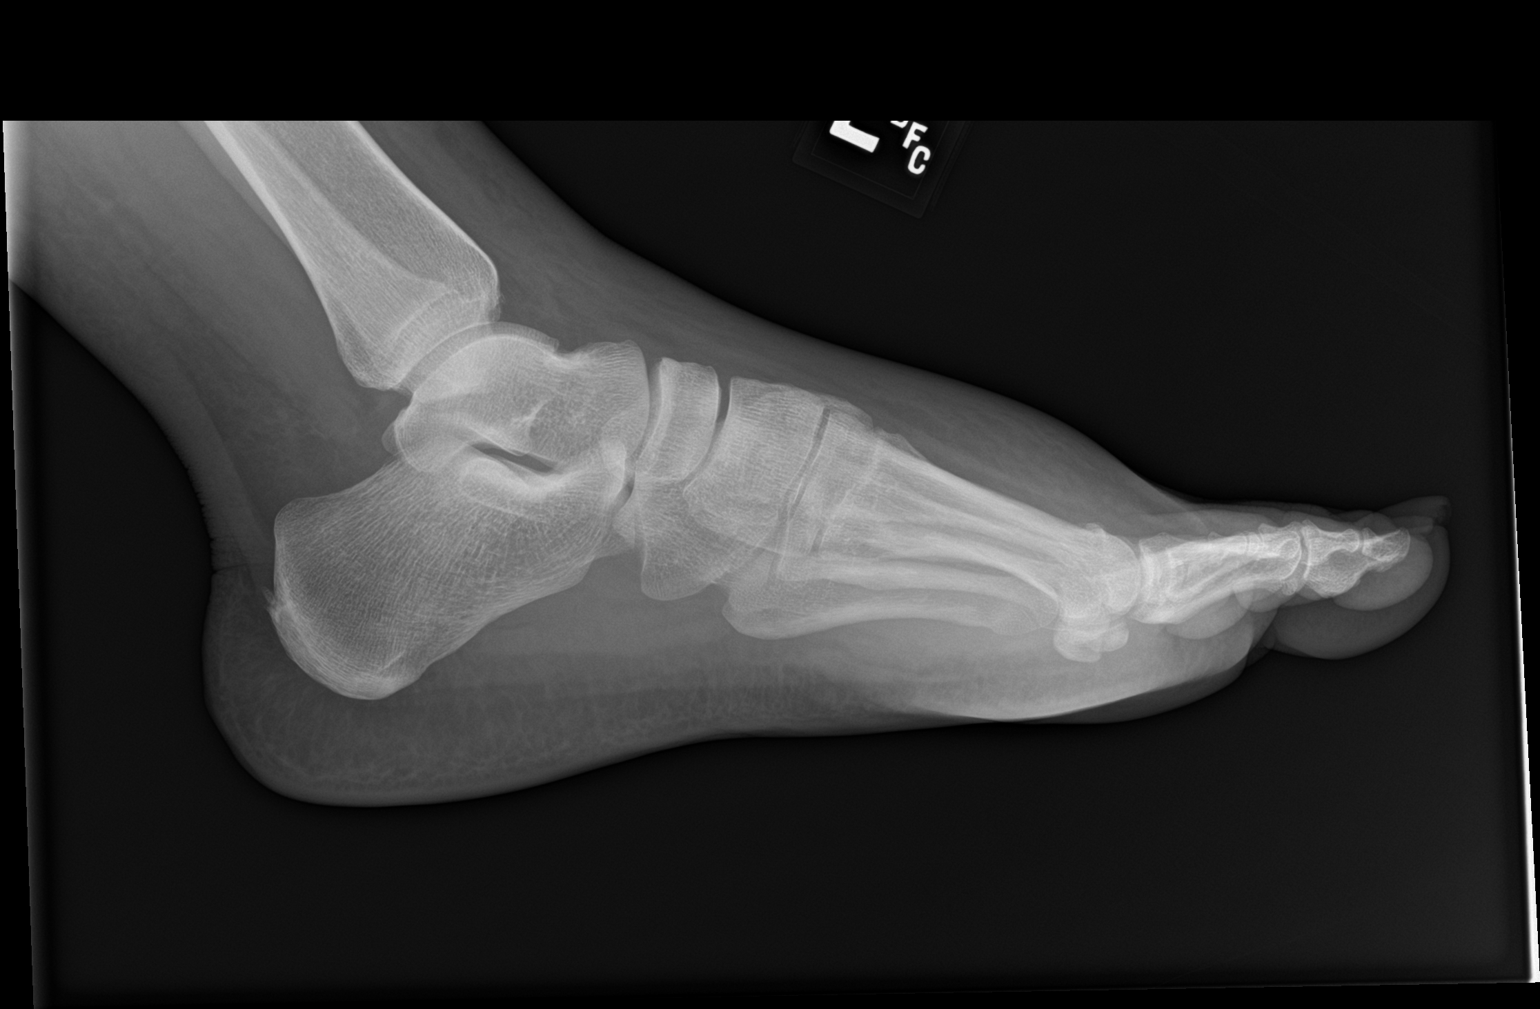

[3 of 3 positions shown; findings below may reference images not displayed]

FINDINGS: Left tibia and fibula:

There is no evidence of fracture or other focal bone lesions. Soft
tissues are unremarkable.

Left foot:

There is no evidence of fracture or other focal bone lesions. Soft
tissues are unremarkable.
IMPRESSION: Negative.

By: Ferienhaus Erxleben M.D.

## 2019-03-24 IMAGING — DX DG TIBIA/FIBULA 2V*L*
4 series · 4 of 4 positions shown · non-contrast
Comparison: None.

CLINICAL DATA: 36 y/o F; fall injury 1 month ago with persistent
pain of the left foot and midshaft of tibia/fibula.

EXAM:
LEFT TIBIA AND FIBULA - 2 VIEW; LEFT FOOT - COMPLETE 3+ VIEW

[tibia ap (1 of 2)]
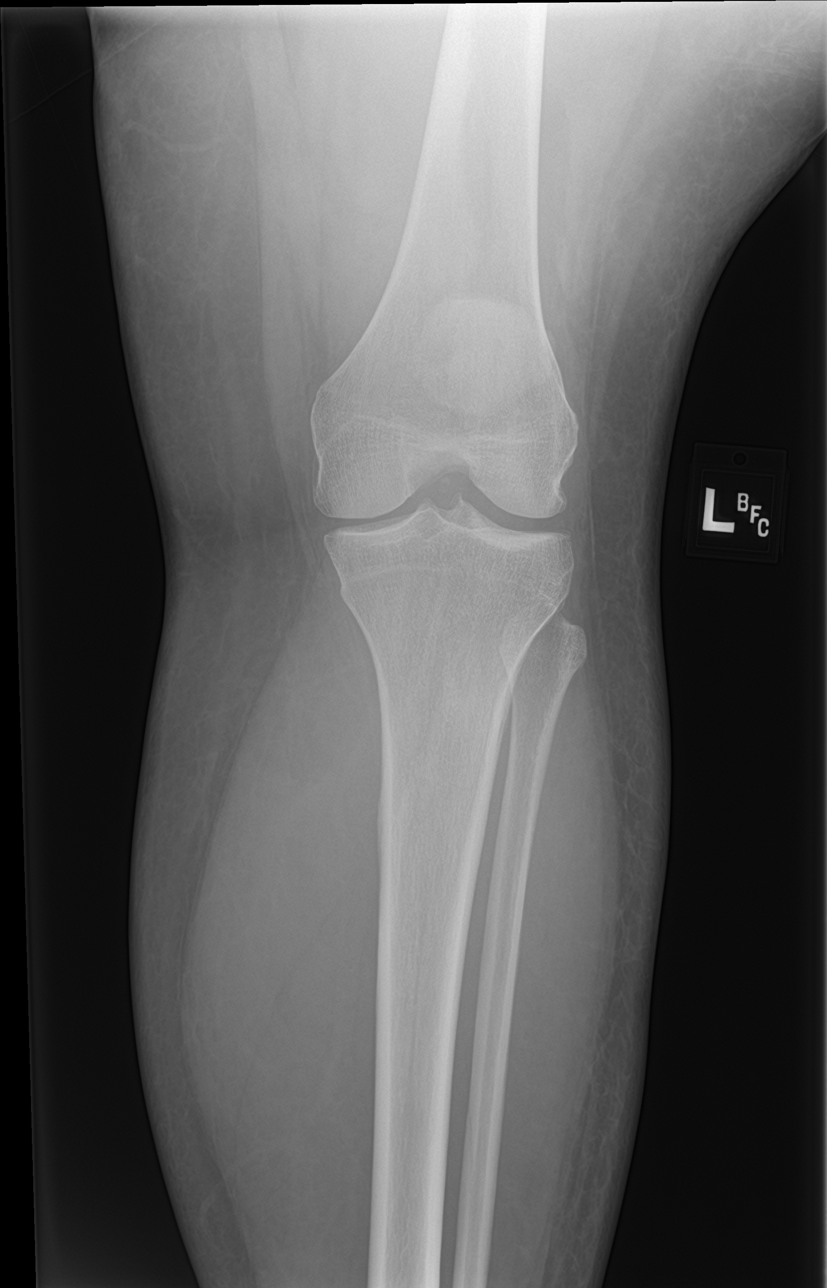

[tibia ap (2 of 2)]
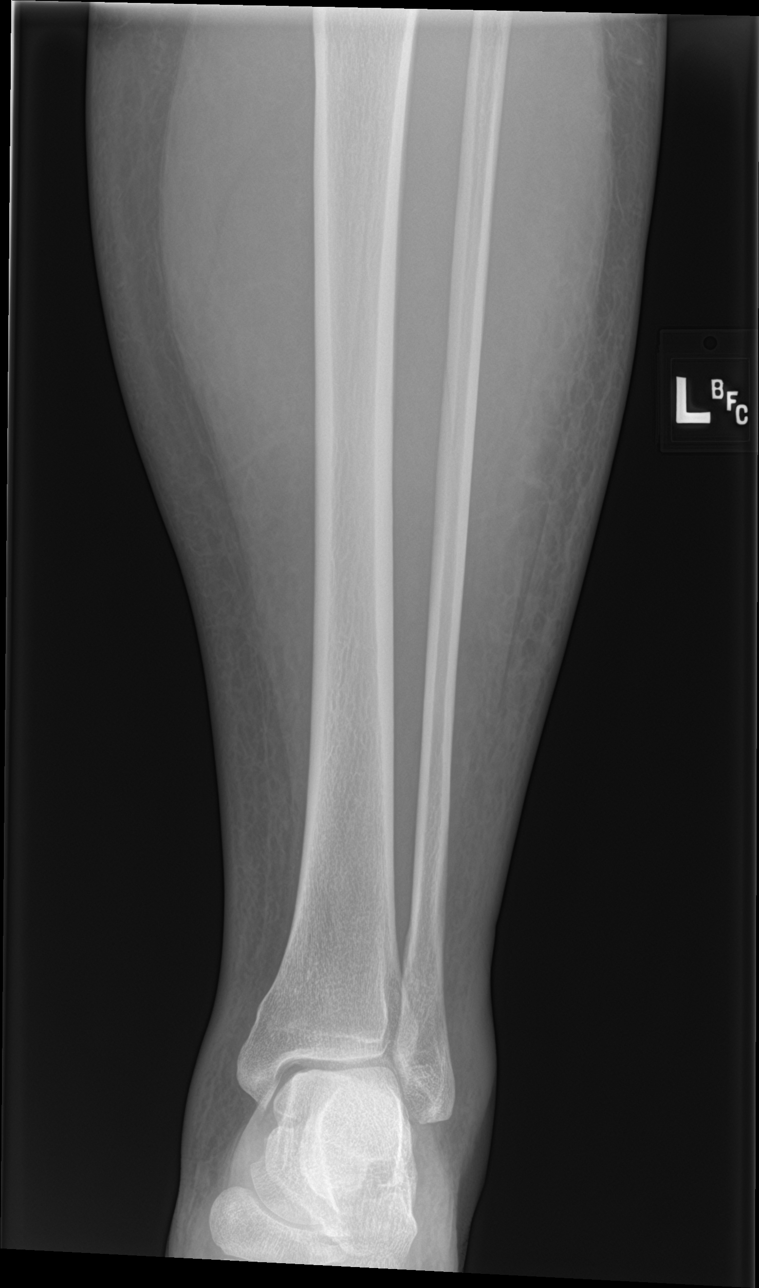

[tibia lat (1 of 2)]
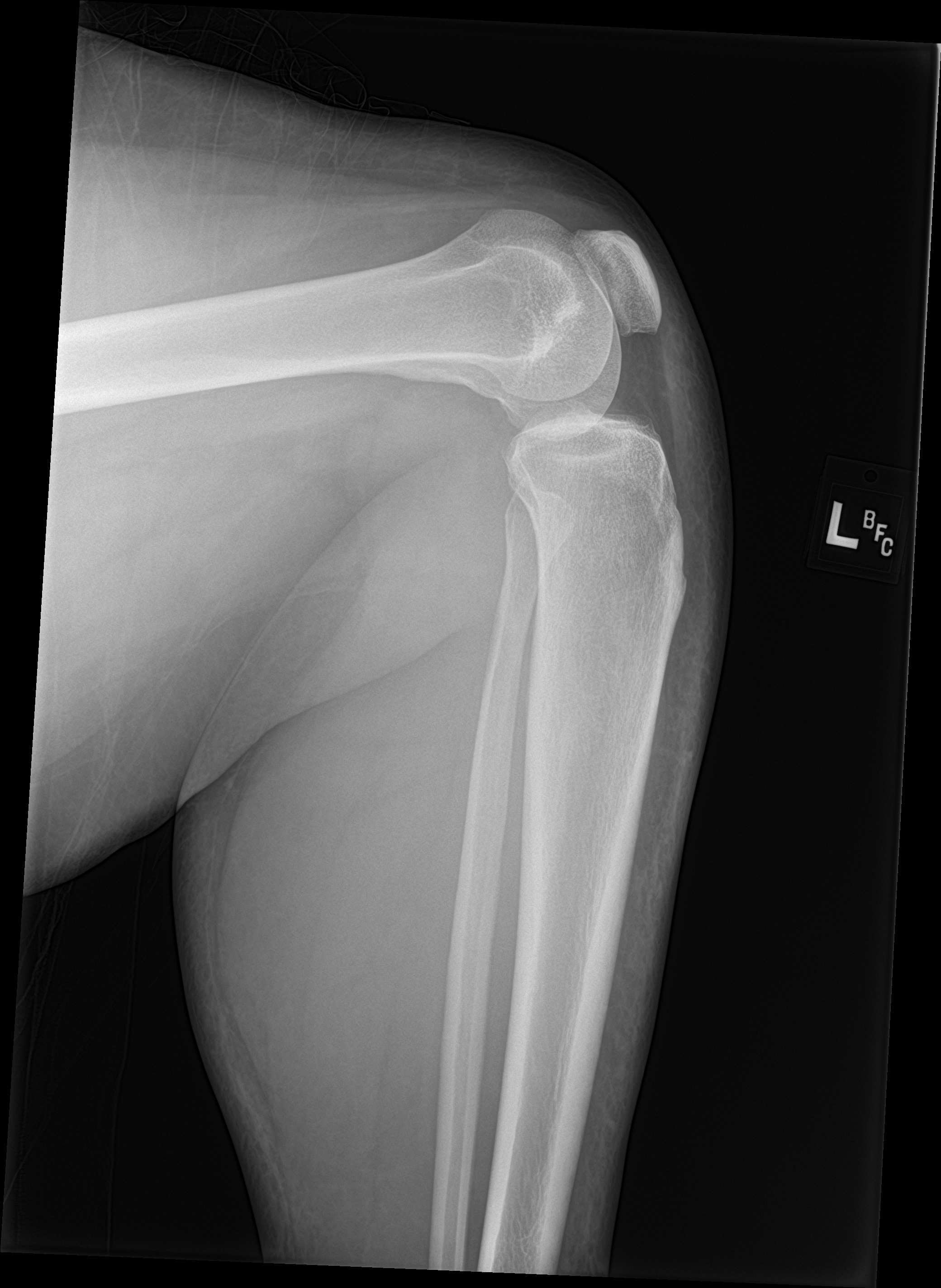

[tibia lat (2 of 2)]
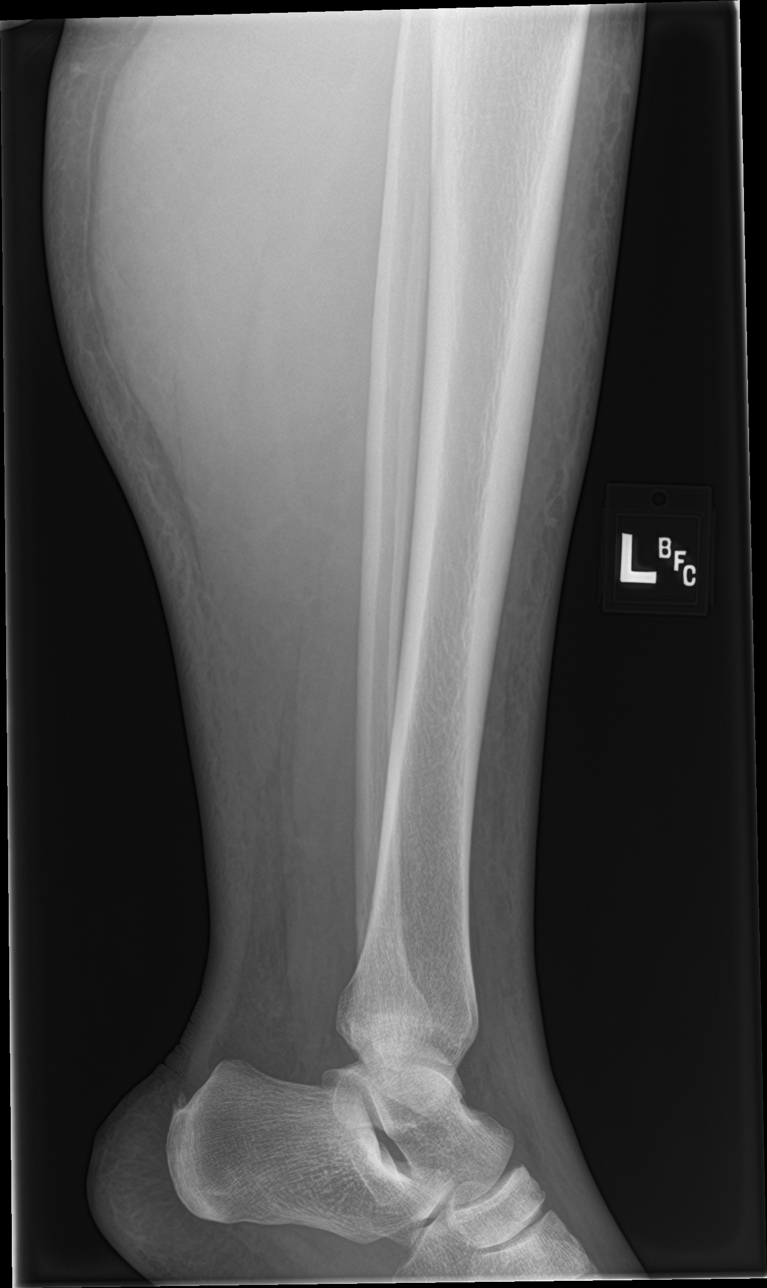

[4 of 4 positions shown; findings below may reference images not displayed]

FINDINGS: Left tibia and fibula:

There is no evidence of fracture or other focal bone lesions. Soft
tissues are unremarkable.

Left foot:

There is no evidence of fracture or other focal bone lesions. Soft
tissues are unremarkable.
IMPRESSION: Negative.

By: Ferienhaus Erxleben M.D.

## 2019-06-24 ENCOUNTER — Ambulatory Visit: Payer: Medicaid Other

## 2019-06-24 ENCOUNTER — Other Ambulatory Visit: Payer: Self-pay

## 2019-06-24 ENCOUNTER — Emergency Department (HOSPITAL_COMMUNITY)
Admission: EM | Admit: 2019-06-24 | Discharge: 2019-06-25 | Disposition: A | Discharge status: Home / Self Care | Payer: Medicaid Other | Attending: Emergency Medicine | Admitting: Emergency Medicine

## 2019-06-24 ENCOUNTER — Encounter (HOSPITAL_COMMUNITY): Payer: Self-pay | Admitting: Emergency Medicine

## 2019-06-24 DIAGNOSIS — Z79899 Other long term (current) drug therapy: Secondary | ICD-10-CM | POA: Insufficient documentation

## 2019-06-24 DIAGNOSIS — N760 Acute vaginitis: Secondary | ICD-10-CM | POA: Diagnosis present

## 2019-06-24 DIAGNOSIS — F17210 Nicotine dependence, cigarettes, uncomplicated: Secondary | ICD-10-CM | POA: Insufficient documentation

## 2019-06-24 DIAGNOSIS — E119 Type 2 diabetes mellitus without complications: Secondary | ICD-10-CM | POA: Insufficient documentation

## 2019-06-24 NOTE — ED Triage Notes (Signed)
Patient here from home with complaints of vaginitis. Reports she thinks she is allergic to condom. Sexually active yesterday. Itching, denies drainage.

## 2019-06-25 LAB — WET PREP, GENITAL
Clue Cells Wet Prep HPF POC: NONE SEEN
Sperm: NONE SEEN
Trich, Wet Prep: NONE SEEN
WBC, Wet Prep HPF POC: NONE SEEN
Yeast Wet Prep HPF POC: NONE SEEN

## 2019-06-25 LAB — GC/CHLAMYDIA PROBE AMP (~~LOC~~) NOT AT ARMC
Chlamydia: NEGATIVE
Molecular Disclaimer: NEGATIVE
Molecular Disclaimer: NORMAL
Neisseria Gonorrhea: NEGATIVE

## 2019-06-25 LAB — URINALYSIS, ROUTINE W REFLEX MICROSCOPIC
Bilirubin Urine: NEGATIVE
Glucose, UA: NEGATIVE mg/dL
Hgb urine dipstick: NEGATIVE
Ketones, ur: NEGATIVE mg/dL
Nitrite: NEGATIVE
Protein, ur: NEGATIVE mg/dL
Specific Gravity, Urine: 1.019 (ref 1.005–1.030)
pH: 6 (ref 5.0–8.0)

## 2019-06-25 LAB — HCG, QUANTITATIVE, PREGNANCY: hCG, Beta Chain, Quant, S: <1 m[IU]/mL (ref <5)

## 2019-06-25 MED ORDER — LIDOCAINE HCL 1 % IJ SOLN
INTRAMUSCULAR | Status: AC
  Administered 2019-06-25: 0.9 mL
  Filled 2019-06-25: qty 20

## 2019-06-25 MED ORDER — AZITHROMYCIN 250 MG PO TABS
1000.0000 mg | ORAL_TABLET | Freq: Once | ORAL | Status: AC
  Administered 2019-06-25: 1000 mg via ORAL
  Filled 2019-06-25: qty 4

## 2019-06-25 MED ORDER — CEFTRIAXONE SODIUM 250 MG IJ SOLR
250.0000 mg | Freq: Once | INTRAMUSCULAR | Status: AC
  Administered 2019-06-25: 250 mg via INTRAMUSCULAR
  Filled 2019-06-25: qty 250

## 2019-06-25 NOTE — ED Provider Notes (Signed)
[facility] DEPT Provider Note   CSN: [id number] Arrival date & time: 06/24/19  2022     History   Chief Complaint Chief Complaint  Patient presents with  . Vaginitis    HPI Katherine Douglas is a 38 y.o. female.     HPI   This is a 38 year old female with history of depression and diabetes who presents with vaginal itching.  Patient reports that she was sexually active for the first time in over a year last week.  She reports that the condom got stuck in her.  She states that her partner went to retrieve the condom and the condom broke.  Several days later she developed vaginal itching.  She has not had any vaginal discharge or abdominal pain.  She took Benadryl with some relief of symptoms.  No prior history of allergy to condoms.  Denies history of STDs.  Past Medical History:  Diagnosis Date  . Depression   . Diabetes mellitus without complication ([place])    pt states was gestational with previous pregnancy  . Headache   . Panic attacks   . Pneumonia   . Stress     Patient Active Problem List   Diagnosis Date Noted  . Morbid obesity ([place]) 07/05/2017  . Multiparity 04/14/2015    Past Surgical History:  Procedure Laterality Date  . INDUCED ABORTION       OB History    Gravida  5   Para  3   Term  3   Preterm      AB  2   Living  3     SAB      TAB  2   Ectopic      Multiple  0   Live Births  3            Home Medications    Prior to Admission medications   Medication Sig Start Date End Date Taking? Authorizing Provider  amLODipine (NORVASC) 10 MG tablet Take 10 mg by mouth daily. 08/24/17   [provider]  atenolol-chlorthalidone (TENORETIC) 50-25 MG tablet Take 1 tablet by mouth daily.    [provider]  cyclobenzaprine (FLEXERIL) 10 MG tablet Take 1 tablet (10 mg total) by mouth 2 (two) times daily as needed for muscle spasms. Patient not taking: Reported on 09/21/2017 07/14/17    Charlann Lange, PA-C  doxycycline (VIBRAMYCIN) 100 MG capsule Take 1 capsule (100 mg total) by mouth 2 (two) times daily. 07/16/18   Shelly Bombard, MD  ibuprofen (ADVIL,MOTRIN) 800 MG tablet Take 1 tablet (800 mg total) by mouth every 8 (eight) hours as needed. 07/16/18   Shelly Bombard, MD  metroNIDAZOLE (FLAGYL) 500 MG tablet Take 1 tablet (500 mg total) by mouth 2 (two) times daily. Patient not taking: Reported on 07/16/2018 10/04/17   Shelly Bombard, MD  metroNIDAZOLE (FLAGYL) 500 MG tablet Take 1 tablet (500 mg total) by mouth 2 (two) times daily. Patient not taking: Reported on 08/07/2018 07/16/18   Shelly Bombard, MD  naproxen (EC-NAPROSYN) 500 MG EC tablet Take 1 tablet (500 mg total) by mouth 2 (two) times daily with a meal. Patient not taking: Reported on 07/16/2018 10/03/17   Shelly Bombard, MD  Oxycodone HCl 10 MG TABS Take 10 mg by mouth 3 (three) times daily. 09/06/17   [provider]  oxyCODONE-acetaminophen (PERCOCET) 10-325 MG tablet Take 1 tablet by mouth every 6 (six) hours as needed for pain. Patient not  taking: Reported on 07/16/2018 10/03/17   Shelly Bombard, MD  phentermine 37.5 MG capsule Take 1 capsule (37.5 mg total) by mouth every morning. 08/07/18   Shelly Bombard, MD  traMADol (ULTRAM) 50 MG tablet Take 1 tablet (50 mg total) by mouth every 6 (six) hours as needed. 08/07/18   Shelly Bombard, MD    Family History Family History  Problem Relation Age of Onset  . Hypertension Mother   . Heart failure Mother   . Diabetes Father   . CAD Other     Social History Social History   Tobacco Use  . Smoking status: Current Every Day Smoker    Packs/day: 0.25    Years: 18.00    Pack years: 4.50    Types: Cigarettes    Last attempt to quit: 09/15/2014    Years since quitting: 4.7  . Smokeless tobacco: Never Used  Substance Use Topics  . Alcohol use: Yes  . Drug use: No    Frequency: 4.0 times per week     Allergies   Other,  Amoxicillin, Peanut-containing drug products, Penicillins, and Strawberry extract   Review of Systems Review of Systems  Constitutional: Negative for fever.  Gastrointestinal: Negative for abdominal pain.  Genitourinary: Negative for dysuria, vaginal bleeding, vaginal discharge and vaginal pain.       Vaginal itching  All other systems reviewed and are negative.    Physical Exam Updated Vital Signs BP (!) 153/105 (BP Location: Left Arm)   Pulse 85   Temp 98.7 F (37.1 C) (Oral)   Resp 18   Ht 1.549 m (5\' 1" )   Wt 111.6 kg   SpO2 96%   BMI 46.48 kg/m   Physical Exam Vitals signs and nursing note reviewed.  Constitutional:      Appearance: She is well-developed. She is obese.  HENT:     Head: Normocephalic and atraumatic.  Cardiovascular:     Rate and Rhythm: Normal rate and regular rhythm.  Pulmonary:     Effort: Pulmonary effort is normal. No respiratory distress.  Abdominal:     Palpations: Abdomen is soft.     Tenderness: There is no abdominal tenderness.  Genitourinary:    Comments: Normal external vaginal exam, no cervical friability noted, no lesions or irritation noted Musculoskeletal:     Right lower leg: No edema.     Left lower leg: No edema.  Skin:    General: Skin is warm and dry.  Neurological:     Mental Status: She is alert and oriented to person, place, and time.  Psychiatric:        Mood and Affect: Mood normal.      ED Treatments / Results  Labs (all labs ordered are listed, but only abnormal results are displayed) Labs Reviewed  URINALYSIS, ROUTINE W REFLEX MICROSCOPIC - Abnormal; Notable for the following components:      Result Value   APPearance CLOUDY (*)    Leukocytes,Ua LARGE (*)    Bacteria, UA RARE (*)    All other components within normal limits  WET PREP, GENITAL  HCG, QUANTITATIVE, PREGNANCY  GC/CHLAMYDIA PROBE AMP ([place]) NOT AT [facility]    EKG None  Radiology No results found.  Procedures Procedures  (including critical care time)  Medications Ordered in ED Medications  azithromycin (ZITHROMAX) tablet 1,000 mg (has no administration in time range)  cefTRIAXone (ROCEPHIN) injection 250 mg (250 mg Intramuscular Given 06/25/19 0109)     Initial Impression / Assessment  and Plan / ED Course  I have reviewed the triage vital signs and the nursing notes.  Pertinent labs & imaging results that were available during my care of the patient were reviewed by me and considered in my medical decision making (see chart for details).        Patient presents with vaginal itching.  Reports this after sexual encounter.  Her exam is fairly benign.  No lesions or significant irritation.  Patient was tested and treated for STDs.  No yeast or BV noted on the wet prep.  Suspect that this may be contact irritation from latex condom.  Continue Benadryl as needed.  Encouraged her to use non-fragrance soaps and cotton white underwear.  Women's follow-up recommended.  After history, exam, and medical workup I feel the patient has been appropriately medically screened and is safe for discharge home. Pertinent diagnoses were discussed with the patient. Patient was given return precautions.   Final Clinical Impressions(s) / ED Diagnoses   Final diagnoses:  Acute vaginitis    ED Discharge Orders    None       Merryl Hacker, MD 06/25/19 0111

## 2019-06-25 NOTE — Discharge Instructions (Addendum)
You were seen today for vaginal itching.  You were tested and treated for STDs.  There is no evidence of yeast infection or bacterial vaginosis.  Continue Benadryl as needed.  Avoid any scented soaps.  Wear white cotton underwear.  If you find that you have recurrent symptoms with condom use, you may need to avoid latex condoms.

## 2019-07-22 ENCOUNTER — Ambulatory Visit: Payer: Medicaid Other | Admitting: Obstetrics

## 2019-10-24 ENCOUNTER — Other Ambulatory Visit: Payer: Self-pay

## 2019-10-24 ENCOUNTER — Encounter: Payer: Self-pay | Admitting: Obstetrics

## 2019-10-24 ENCOUNTER — Ambulatory Visit: Payer: Medicaid Other | Admitting: Obstetrics

## 2019-10-24 VITALS — Wt 298.0 lb

## 2019-10-24 DIAGNOSIS — N944 Primary dysmenorrhea: Secondary | ICD-10-CM | POA: Diagnosis not present

## 2019-10-24 DIAGNOSIS — N939 Abnormal uterine and vaginal bleeding, unspecified: Secondary | ICD-10-CM | POA: Diagnosis not present

## 2019-10-24 DIAGNOSIS — Z3046 Encounter for surveillance of implantable subdermal contraceptive: Secondary | ICD-10-CM

## 2019-10-24 DIAGNOSIS — F17210 Nicotine dependence, cigarettes, uncomplicated: Secondary | ICD-10-CM

## 2019-10-24 DIAGNOSIS — Z6841 Body Mass Index (BMI) 40.0 and over, adult: Secondary | ICD-10-CM

## 2019-10-24 MED ORDER — IBUPROFEN 800 MG PO TABS: 800.0000 mg | ORAL_TABLET | Freq: Three times a day (TID) | ORAL | 5 refills | Status: DC | PRN

## 2019-10-24 MED ORDER — KETOROLAC TROMETHAMINE 60 MG/2ML IM SOLN
60.0000 mg | Freq: Once | INTRAMUSCULAR | Status: AC
  Administered 2019-10-24: 17:00:00 60 mg via INTRAMUSCULAR

## 2019-10-24 MED ORDER — ORTHO-NOVUM 1/35 (28) 1-35 MG-MCG PO TABS: 1.0000 | ORAL_TABLET | Freq: Every day | ORAL | 0 refills | Status: DC

## 2019-10-24 NOTE — Progress Notes (Signed)
Patient ID: Katherine Douglas, female   DOB: May 27, 1981, 39 y.o.   MRN: [id number]  Chief Complaint  Patient presents with  . AUB    HPI Katherine Douglas is a 39 y.o. female.  Heavy vaginal bleeding and cramping after Nexplanon insertion. HPI  Past Medical History:  Diagnosis Date  . Depression   . Diabetes mellitus without complication ([place])    pt states was gestational with previous pregnancy  . Headache   . Panic attacks   . Pneumonia   . Stress     Past Surgical History:  Procedure Laterality Date  . INDUCED ABORTION      Family History  Problem Relation Age of Onset  . Hypertension Mother   . Heart failure Mother   . Diabetes Father   . CAD Other     Social History Social History   Tobacco Use  . Smoking status: Current Every Day Smoker    Packs/day: 0.25    Years: 18.00    Pack years: 4.50    Types: Cigarettes    Last attempt to quit: 09/15/2014    Years since quitting: 5.1  . Smokeless tobacco: Never Used  Substance Use Topics  . Alcohol use: Yes  . Drug use: No    Frequency: 4.0 times per week    Allergies  Allergen Reactions  . Other Anaphylaxis    Crawfish   . Amoxicillin Hives    Has patient had a PCN reaction causing immediate rash, facial/tongue/throat swelling, SOB or lightheadedness with hypotension:unsure Has patient had a PCN reaction causing severe rash involving mucus membranes or skin necrosis:No Has patient had a PCN reaction that required hospitalization:No Has patient had a PCN reaction occurring within the last 10 years:Yes If all of the above answers are "NO", then may proceed with Cephalosporin use.   Marland Kitchen Peanut-Containing Drug Products Itching  . Penicillins Itching    Has patient had a PCN reaction causing immediate rash, facial/tongue/throat swelling, SOB or lightheadedness with hypotension:unsure Has patient had a PCN reaction causing severe rash involving mucus membranes or skin necrosis:No Has patient had a PCN  reaction that required hospitalization:No Has patient had a PCN reaction occurring within the last 10 years:Yes If all of the above answers are "NO", then may proceed with Cephalosporin use.    . Strawberry Extract Hives, Itching and Rash    Current Outpatient Medications  Medication Sig Dispense Refill  . atenolol-chlorthalidone (TENORETIC) 50-25 MG tablet Take 1 tablet by mouth daily.    Marland Kitchen amLODipine (NORVASC) 10 MG tablet Take 10 mg by mouth daily.  11  . ibuprofen (ADVIL) 800 MG tablet Take 1 tablet (800 mg total) by mouth every 8 (eight) hours as needed. 30 tablet 5  . norethindrone-ethinyl estradiol 1/35 ([place] 1/35, 28,) tablet Take 1 tablet by mouth daily. 1 Package 0   No current facility-administered medications for this visit.    Review of Systems Review of Systems Constitutional: negative for fatigue and weight loss Respiratory: negative for cough and wheezing Cardiovascular: negative for chest pain, fatigue and palpitations Gastrointestinal: negative for abdominal pain and change in bowel habits Genitourinary:positive for heavy vaginal bleeding Integument/breast: negative for nipple discharge Musculoskeletal:negative for myalgias Neurological: negative for gait problems and tremors Behavioral/Psych: negative for abusive relationship, depression Endocrine: negative for temperature intolerance      Weight 298 lb (135.2 kg), last menstrual period 10/03/2019.  Physical Exam Physical Exam:  Deferred  >50% of 15 min visit spent on counseling and coordination of  care.   Data Reviewed Labs  Assessment     1. Encounter for surveillance of Nexplanon subdermal contraceptive  2. Abnormal uterine bleeding (AUB) Rx: - norethindrone-ethinyl estradiol 1/35 ([place] 1/35, 28,) tablet; Take 1 tablet by mouth daily.  Dispense: 1 Package; Refill: 0 - ketorolac (TORADOL) injection 60 mg  3. Primary dysmenorrhea Rx: - ibuprofen (ADVIL) 800 MG tablet; Take 1 tablet  (800 mg total) by mouth every 8 (eight) hours as needed.  Dispense: 30 tablet; Refill: 5 - ketorolac (TORADOL) injection 60 mg  4. Class 3 severe obesity due to excess calories without serious comorbidity with body mass index (BMI) of 50.0 to 59.9 in adult [facility]) - program of caloric restriction, exercise and behavioral modification recommended  5. Tobacco dependence due to cigarettes - tobacco cessation with the aid of medication and behavioral modification recommended    Plan    Follow up in 4 weeks    Meds ordered this encounter  Medications  . norethindrone-ethinyl estradiol 1/35 ([place] 1/35, 28,) tablet    Sig: Take 1 tablet by mouth daily.    Dispense:  1 Package    Refill:  0  . ibuprofen (ADVIL) 800 MG tablet    Sig: Take 1 tablet (800 mg total) by mouth every 8 (eight) hours as needed.    Dispense:  30 tablet    Refill:  5  . ketorolac (TORADOL) injection 60 mg       Shelly Bombard, MD 10/24/2019 4:45 PM

## 2019-11-21 ENCOUNTER — Telehealth (INDEPENDENT_AMBULATORY_CARE_PROVIDER_SITE_OTHER): Payer: Medicaid Other | Admitting: Obstetrics

## 2019-11-21 ENCOUNTER — Encounter: Payer: Self-pay | Admitting: Obstetrics

## 2019-11-21 DIAGNOSIS — N944 Primary dysmenorrhea: Secondary | ICD-10-CM

## 2019-11-21 DIAGNOSIS — F17210 Nicotine dependence, cigarettes, uncomplicated: Secondary | ICD-10-CM

## 2019-11-21 DIAGNOSIS — I10 Essential (primary) hypertension: Secondary | ICD-10-CM

## 2019-11-21 DIAGNOSIS — N939 Abnormal uterine and vaginal bleeding, unspecified: Secondary | ICD-10-CM | POA: Diagnosis not present

## 2019-11-21 DIAGNOSIS — Z6841 Body Mass Index (BMI) 40.0 and over, adult: Secondary | ICD-10-CM

## 2019-11-21 DIAGNOSIS — Z3046 Encounter for surveillance of implantable subdermal contraceptive: Secondary | ICD-10-CM

## 2019-11-21 MED ORDER — ORTHO-NOVUM 1/35 (28) 1-35 MG-MCG PO TABS: 1.0000 | ORAL_TABLET | Freq: Every day | ORAL | 0 refills | Status: DC

## 2019-11-21 MED ORDER — IBUPROFEN 800 MG PO TABS: 800.0000 mg | ORAL_TABLET | Freq: Three times a day (TID) | ORAL | 5 refills | Status: DC | PRN

## 2019-11-21 NOTE — Progress Notes (Addendum)
Katherine Douglas is a 39 y.o. female who presents for contraception counseling.  She has Nexplanon and is having vaginal bleeding  The patient is sexually active. Pertinent past medical history: current smoker.  The information documented in the HPI was reviewed and verified.  Menstrual History: OB History    Gravida  5   Para  3   Term  3   Preterm      AB  2   Living  3     SAB      TAB  2   Ectopic      Multiple  0   Live Births  3            No LMP recorded. Patient has had an implant.   Patient Active Problem List   Diagnosis Date Noted  . Morbid obesity ([place]) 07/05/2017  . Multiparity 04/14/2015   Past Medical History:  Diagnosis Date  . Depression   . Diabetes mellitus without complication ([place])    pt states was gestational with previous pregnancy  . Headache   . Panic attacks   . Pneumonia   . Stress     Past Surgical History:  Procedure Laterality Date  . INDUCED ABORTION       Current Outpatient Medications:  .  atenolol-chlorthalidone (TENORETIC) 50-25 MG tablet, Take 1 tablet by mouth daily., Disp: , Rfl:  .  ibuprofen (ADVIL) 800 MG tablet, Take 1 tablet (800 mg total) by mouth every 8 (eight) hours as needed., Disp: 30 tablet, Rfl: 5 .  amLODipine (NORVASC) 10 MG tablet, Take 10 mg by mouth daily., Disp: , Rfl: 11 .  norethindrone-ethinyl estradiol 1/35 (Coffee 1/35, 28,) tablet, Take 1 tablet by mouth daily., Disp: 1 Package, Rfl: 0 Allergies  Allergen Reactions  . Other Anaphylaxis    Crawfish   . Amoxicillin Hives    Has patient had a PCN reaction causing immediate rash, facial/tongue/throat swelling, SOB or lightheadedness with hypotension:unsure Has patient had a PCN reaction causing severe rash involving mucus membranes or skin necrosis:No Has patient had a PCN reaction that required hospitalization:No Has patient had a PCN reaction occurring within the last 10 years:Yes If all of the above answers are "NO", then  may proceed with Cephalosporin use.   Marland Kitchen Peanut-Containing Drug Products Itching  . Penicillins Itching    Has patient had a PCN reaction causing immediate rash, facial/tongue/throat swelling, SOB or lightheadedness with hypotension:unsure Has patient had a PCN reaction causing severe rash involving mucus membranes or skin necrosis:No Has patient had a PCN reaction that required hospitalization:No Has patient had a PCN reaction occurring within the last 10 years:Yes If all of the above answers are "NO", then may proceed with Cephalosporin use.    . Strawberry Extract Hives, Itching and Rash    Social History   Tobacco Use  . Smoking status: Current Every Day Smoker    Packs/day: 0.25    Years: 18.00    Pack years: 4.50    Types: Cigarettes    Last attempt to quit: 09/15/2014    Years since quitting: 5.1  . Smokeless tobacco: Never Used  Substance Use Topics  . Alcohol use: Yes    Family History  Problem Relation Age of Onset  . Hypertension Mother   . Heart failure Mother   . Diabetes Father   . CAD Other        Review of Systems Constitutional: negative for weight loss Genitourinary:negative for abnormal menstrual periods  and vaginal discharge   Objective:   There were no vitals taken for this visit.   General:   alert and no distress  Skin:   no rash or abnormalities  Lungs:   clear to auscultation bilaterally  Heart:   regular rate and rhythm, S1, S2 normal, no murmur, click, rub or gallop    Lab Review Urine pregnancy test Labs reviewed yes Radiologic studies reviewed yes  >50% of 15 min visit spent on counseling and coordination of care.    Assessment:    39 y.o., continuing Nexplanon, no contraindications.   Plan:    1. Encounter for surveillance of Nexplanon subdermal contraceptive  2. Abnormal uterine bleeding (AUB) Rx: - megestrol (MEGACE) 40 MG tablet; Take 1 tab po bid x 7 days, then take 1 tab po daily.  Dispense: 30 tablet; Refill:  0  3. Primary dysmenorrhea Rx: - ibuprofen (ADVIL) 800 MG tablet; Take 1 tablet (800 mg total) by mouth every 8 (eight) hours as needed.  Dispense: 30 tablet; Refill: 5  4. HTN (hypertension), benign - managed by PCP  5. Class 3 severe obesity due to excess calories without serious comorbidity with body mass index (BMI) of 50.0 to 59.9 in adult ([place])  6. Tobacco dependence due to cigarettes    All questions answered. Contraception: Nexplanon. Follow up in 3 months.   Meds ordered this encounter  Medications  . norethindrone-ethinyl estradiol 1/35 ([place] 1/35, 28,) tablet    Sig: Take 1 tablet by mouth daily.    Dispense:  1 Package    Refill:  0  . ibuprofen (ADVIL) 800 MG tablet    Sig: Take 1 tablet (800 mg total) by mouth every 8 (eight) hours as needed.    Dispense:  30 tablet    Refill:  5    Shelly Bombard, MD 11/22/2019 12:01 AM

## 2019-11-21 NOTE — Progress Notes (Signed)
S/w pt for virtual visit, pt reports that she has nexplanon and is taking BC pills also for AUB, pt states that bleeding is not better since starting the pills last month. Last office visit 10-24-19

## 2019-11-22 MED ORDER — MEGESTROL ACETATE 40 MG PO TABS: ORAL_TABLET | ORAL | 0 refills | Status: DC

## 2019-11-22 NOTE — Addendum Note (Signed)
Addended by: Baltazar Najjar A on: 11/22/2019 12:20 PM   Modules accepted: Orders

## 2020-01-11 ENCOUNTER — Inpatient Hospital Stay (HOSPITAL_COMMUNITY)
Admission: EM | Admit: 2020-01-11 | Discharge: 2020-01-15 | DRG: 191 | Disposition: A | Discharge status: Home / Self Care | Payer: Medicaid Other | Attending: Internal Medicine | Admitting: Internal Medicine

## 2020-01-11 ENCOUNTER — Encounter (HOSPITAL_COMMUNITY): Payer: Self-pay

## 2020-01-11 ENCOUNTER — Other Ambulatory Visit: Payer: Self-pay

## 2020-01-11 ENCOUNTER — Emergency Department (HOSPITAL_COMMUNITY): Payer: Medicaid Other

## 2020-01-11 DIAGNOSIS — I10 Essential (primary) hypertension: Secondary | ICD-10-CM | POA: Diagnosis present

## 2020-01-11 DIAGNOSIS — F419 Anxiety disorder, unspecified: Secondary | ICD-10-CM

## 2020-01-11 DIAGNOSIS — Z8249 Family history of ischemic heart disease and other diseases of the circulatory system: Secondary | ICD-10-CM | POA: Diagnosis not present

## 2020-01-11 DIAGNOSIS — Z91410 Personal history of adult physical and sexual abuse: Secondary | ICD-10-CM

## 2020-01-11 DIAGNOSIS — F17210 Nicotine dependence, cigarettes, uncomplicated: Secondary | ICD-10-CM | POA: Diagnosis present

## 2020-01-11 DIAGNOSIS — Z91018 Allergy to other foods: Secondary | ICD-10-CM | POA: Diagnosis not present

## 2020-01-11 DIAGNOSIS — Z720 Tobacco use: Secondary | ICD-10-CM | POA: Diagnosis not present

## 2020-01-11 DIAGNOSIS — Y92239 Unspecified place in hospital as the place of occurrence of the external cause: Secondary | ICD-10-CM | POA: Diagnosis not present

## 2020-01-11 DIAGNOSIS — J45901 Unspecified asthma with (acute) exacerbation: Secondary | ICD-10-CM | POA: Diagnosis not present

## 2020-01-11 DIAGNOSIS — Z6841 Body Mass Index (BMI) 40.0 and over, adult: Secondary | ICD-10-CM | POA: Diagnosis not present

## 2020-01-11 DIAGNOSIS — R739 Hyperglycemia, unspecified: Secondary | ICD-10-CM | POA: Diagnosis not present

## 2020-01-11 DIAGNOSIS — Z793 Long term (current) use of hormonal contraceptives: Secondary | ICD-10-CM

## 2020-01-11 DIAGNOSIS — I160 Hypertensive urgency: Secondary | ICD-10-CM | POA: Diagnosis present

## 2020-01-11 DIAGNOSIS — D72829 Elevated white blood cell count, unspecified: Secondary | ICD-10-CM | POA: Diagnosis not present

## 2020-01-11 DIAGNOSIS — Z713 Dietary counseling and surveillance: Secondary | ICD-10-CM | POA: Diagnosis not present

## 2020-01-11 DIAGNOSIS — Z91013 Allergy to seafood: Secondary | ICD-10-CM | POA: Diagnosis not present

## 2020-01-11 DIAGNOSIS — J40 Bronchitis, not specified as acute or chronic: Secondary | ICD-10-CM | POA: Diagnosis not present

## 2020-01-11 DIAGNOSIS — Z716 Tobacco abuse counseling: Secondary | ICD-10-CM

## 2020-01-11 DIAGNOSIS — E1159 Type 2 diabetes mellitus with other circulatory complications: Secondary | ICD-10-CM | POA: Diagnosis not present

## 2020-01-11 DIAGNOSIS — Z9114 Patient's other noncompliance with medication regimen: Secondary | ICD-10-CM

## 2020-01-11 DIAGNOSIS — T380X5A Adverse effect of glucocorticoids and synthetic analogues, initial encounter: Secondary | ICD-10-CM | POA: Diagnosis not present

## 2020-01-11 DIAGNOSIS — R0602 Shortness of breath: Secondary | ICD-10-CM

## 2020-01-11 DIAGNOSIS — Z880 Allergy status to penicillin: Secondary | ICD-10-CM | POA: Diagnosis not present

## 2020-01-11 DIAGNOSIS — R000 Tachycardia, unspecified: Secondary | ICD-10-CM | POA: Diagnosis present

## 2020-01-11 DIAGNOSIS — Z20822 Contact with and (suspected) exposure to covid-19: Secondary | ICD-10-CM | POA: Diagnosis present

## 2020-01-11 DIAGNOSIS — J209 Acute bronchitis, unspecified: Secondary | ICD-10-CM | POA: Diagnosis present

## 2020-01-11 DIAGNOSIS — I340 Nonrheumatic mitral (valve) insufficiency: Secondary | ICD-10-CM | POA: Diagnosis not present

## 2020-01-11 DIAGNOSIS — E1165 Type 2 diabetes mellitus with hyperglycemia: Secondary | ICD-10-CM | POA: Diagnosis present

## 2020-01-11 DIAGNOSIS — Z888 Allergy status to other drugs, medicaments and biological substances status: Secondary | ICD-10-CM | POA: Diagnosis not present

## 2020-01-11 DIAGNOSIS — J441 Chronic obstructive pulmonary disease with (acute) exacerbation: Secondary | ICD-10-CM | POA: Diagnosis present

## 2020-01-11 DIAGNOSIS — R059 Cough, unspecified: Secondary | ICD-10-CM

## 2020-01-11 DIAGNOSIS — R05 Cough: Secondary | ICD-10-CM

## 2020-01-11 DIAGNOSIS — Z791 Long term (current) use of non-steroidal anti-inflammatories (NSAID): Secondary | ICD-10-CM

## 2020-01-11 DIAGNOSIS — F13239 Sedative, hypnotic or anxiolytic dependence with withdrawal, unspecified: Secondary | ICD-10-CM | POA: Diagnosis present

## 2020-01-11 DIAGNOSIS — F329 Major depressive disorder, single episode, unspecified: Secondary | ICD-10-CM | POA: Diagnosis present

## 2020-01-11 DIAGNOSIS — Z833 Family history of diabetes mellitus: Secondary | ICD-10-CM

## 2020-01-11 DIAGNOSIS — Z560 Unemployment, unspecified: Secondary | ICD-10-CM

## 2020-01-11 DIAGNOSIS — T465X6A Underdosing of other antihypertensive drugs, initial encounter: Secondary | ICD-10-CM | POA: Diagnosis present

## 2020-01-11 HISTORY — DX: Essential (primary) hypertension: I10

## 2020-01-11 HISTORY — DX: Unspecified asthma, uncomplicated: J45.909

## 2020-01-11 LAB — BLOOD GAS, VENOUS
Acid-base deficit: 1.2 mmol/L (ref 0.0–2.0)
Bicarbonate: 23.6 mmol/L (ref 20.0–28.0)
O2 Saturation: 88.5 %
Patient temperature: 98.6
pCO2, Ven: 42 mmHg — ABNORMAL LOW (ref 44.0–60.0)
pH, Ven: 7.368 (ref 7.250–7.430)
pO2, Ven: 59.3 mmHg — ABNORMAL HIGH (ref 32.0–45.0)

## 2020-01-11 LAB — BASIC METABOLIC PANEL
Anion gap: 6 (ref 5–15)
BUN: 7 mg/dL (ref 6–20)
CO2: 31 mmol/L (ref 22–32)
Calcium: 8.9 mg/dL (ref 8.9–10.3)
Chloride: 103 mmol/L (ref 98–111)
Creatinine, Ser: 0.86 mg/dL (ref 0.44–1.00)
GFR calc Af Amer: >60 mL/min (ref >60)
GFR calc non Af Amer: >60 mL/min (ref >60)
Glucose, Bld: 164 mg/dL — ABNORMAL HIGH (ref 70–99)
Potassium: 4.7 mmol/L (ref 3.5–5.1)
Sodium: 140 mmol/L (ref 135–145)

## 2020-01-11 LAB — TROPONIN I (HIGH SENSITIVITY): Troponin I (High Sensitivity): 7 ng/L (ref <18)

## 2020-01-11 LAB — CBC
HCT: 44.2 % (ref 36.0–46.0)
Hemoglobin: 13.9 g/dL (ref 12.0–15.0)
MCH: 28.7 pg (ref 26.0–34.0)
MCHC: 31.4 g/dL (ref 30.0–36.0)
MCV: 91.3 fL (ref 80.0–100.0)
Platelets: 473 10*3/uL — ABNORMAL HIGH (ref 150–400)
RBC: 4.84 MIL/uL (ref 3.87–5.11)
RDW: 14.2 % (ref 11.5–15.5)
WBC: 15.1 10*3/uL — ABNORMAL HIGH (ref 4.0–10.5)
nRBC: 0 % (ref 0.0–0.2)

## 2020-01-11 LAB — BRAIN NATRIURETIC PEPTIDE: B Natriuretic Peptide: 45.5 pg/mL (ref 0.0–100.0)

## 2020-01-11 LAB — POC SARS CORONAVIRUS 2 AG -  ED: SARS Coronavirus 2 Ag: NEGATIVE

## 2020-01-11 LAB — RESPIRATORY PANEL BY RT PCR (FLU A&B, COVID)
Influenza A by PCR: NEGATIVE
Influenza B by PCR: NEGATIVE
SARS Coronavirus 2 by RT PCR: NEGATIVE

## 2020-01-11 MED ORDER — ALBUTEROL (5 MG/ML) CONTINUOUS INHALATION SOLN
10.0000 mg/h | INHALATION_SOLUTION | Freq: Once | RESPIRATORY_TRACT | Status: AC
  Administered 2020-01-11: 10 mg/h via RESPIRATORY_TRACT
  Filled 2020-01-11: qty 20

## 2020-01-11 MED ORDER — NITROGLYCERIN 2 % TD OINT
1.0000 [in_us] | TOPICAL_OINTMENT | Freq: Four times a day (QID) | TRANSDERMAL | Status: DC
  Administered 2020-01-11 – 2020-01-13 (×7): 1 [in_us] via TOPICAL
  Filled 2020-01-11 (×2): qty 30

## 2020-01-11 MED ORDER — DEXAMETHASONE SODIUM PHOSPHATE 10 MG/ML IJ SOLN
6.0000 mg | Freq: Once | INTRAMUSCULAR | Status: AC
  Administered 2020-01-11: 18:00:00 6 mg via INTRAVENOUS
  Filled 2020-01-11: qty 1

## 2020-01-11 MED ORDER — IBUPROFEN 200 MG PO TABS
600.0000 mg | ORAL_TABLET | Freq: Once | ORAL | Status: AC
  Administered 2020-01-11: 600 mg via ORAL
  Filled 2020-01-11: qty 3

## 2020-01-11 MED ORDER — AMLODIPINE BESYLATE 5 MG PO TABS
10.0000 mg | ORAL_TABLET | Freq: Once | ORAL | Status: AC
  Administered 2020-01-11: 19:00:00 10 mg via ORAL
  Filled 2020-01-11: qty 2

## 2020-01-11 MED ORDER — ACETAMINOPHEN 325 MG PO TABS
650.0000 mg | ORAL_TABLET | Freq: Once | ORAL | Status: AC
  Administered 2020-01-11: 650 mg via ORAL
  Filled 2020-01-11: qty 2

## 2020-01-11 MED ORDER — AEROCHAMBER Z-STAT PLUS/MEDIUM MISC
1.0000 | Freq: Once | Status: DC
  Filled 2020-01-11: qty 1

## 2020-01-11 MED ORDER — HYDRALAZINE HCL 20 MG/ML IJ SOLN
10.0000 mg | Freq: Once | INTRAMUSCULAR | Status: AC
  Administered 2020-01-11: 10 mg via INTRAVENOUS
  Filled 2020-01-11: qty 1

## 2020-01-11 MED ORDER — ALBUTEROL SULFATE HFA 108 (90 BASE) MCG/ACT IN AERS
4.0000 | INHALATION_SPRAY | Freq: Once | RESPIRATORY_TRACT | Status: DC
  Filled 2020-01-11: qty 6.7

## 2020-01-11 NOTE — ED Triage Notes (Signed)
Patient reports that she began having a cough, SOB, and body aches. Patient states she thought it was allergies/asthma. Patient woke today with worsening symptoms and Albuterol inhaler ws not helping.

## 2020-01-11 NOTE — H&P (Signed)
Katherine Douglas HUD:[id number] DOB: [birth year] DOA: 01/11/2020     PCP: Trey Sailors, PA   Outpatient Specialists: NONE   Patient arrived to ER on 01/11/20 at 1550  Patient coming from: home Lives  With family    Chief Complaint:   Chief Complaint  Patient presents with  . Shortness of Breath  . Hypertension    HPI: Katherine Douglas is a 39 y.o. female with medical history significant of HTN, childhood asthma, tobacco abuse, DM 2, MOrbid Obesity    Presented with   body aches and cough for the past few days she lives at home with her family her 8 year old daughter goes to college.  Nobody been sick in the house.  She has not been vaccinated for Covid. Reports she woke up this morning feeling unwell and have had worsening shortness of breath and coughing when presented to emergency department she was treated with albuterol and feels much better.  Patient states she has significant anxiety and has not been taking her blood pressure medications or her anxiety medications.  Patient states she has had tachycardia in the past. When she was initially evaluated her initial blood pressure was in 220 range she attributed to severe anxiety as well as not taking her blood pressure medications She denies any chest pain no fevers no nausea vomiting no change in taste or smell  Infectious risk factors:  Reports shortness of breath, dry cough, chest pain,      Has NOt been vaccinated against COVID   In  ER RAPID COVID TEST  Negative Lab Results  Component Value Date   [place] NEGATIVE 01/11/2020     Regarding pertinent Chronic problems:      HTN on atenolol     DM 2 - No results found for: HGBA1C  diet controlled     Morbid obesity-   BMI Readings from Last 1 Encounters:  01/11/20 51.76 kg/m       Asthma -well   Controlled,  only as a child                           While in ER: Leukocytosis 20 Felt to have COPD exacerbatoin And severe HTN was treated  with Iv hydralazine    Hospitalist was called for admission for asthma exacerbation  The following Work up has been ordered so far:  Orders Placed This Encounter  Procedures  . Critical Care  . SARS CORONAVIRUS 2 (TAT 6-24 HRS) Nasopharyngeal Nasopharyngeal Swab  . DG Chest 2 View  . DG Chest Port 1 View  . Basic metabolic panel  . CBC  . Brain natriuretic peptide  . If O2 Sat <94% administer O2 at 2 liters/minute via nasal cannula  . Limit fluids < 500cc/8 hrs.  . Cardiac monitoring  . Initiate Carrier Fluid Protocol  . Consult to hospitalist  ALL PATIENTS BEING ADMITTED/HAVING PROCEDURES NEED COVID-19 SCREENING  . Airborne and Contact precautions  . Pulse oximetry, continuous  . Oxygen therapy Mode or (Route): Nasal cannula; Liters Per Minute: 2; Keep 02 saturation: greater than 92  . POC SARS Coronavirus 2 Ag-ED - Nasal Swab (BD Veritor Kit)  . ED EKG     Following Medications were ordered in ER: Medications  albuterol (VENTOLIN HFA) 108 (90 Base) MCG/ACT inhaler 4 puff (4 puffs Inhalation Refused 01/11/20 1805)  aerochamber Z-Stat Plus/medium 1 each (1 each Other Not Given 01/11/20 1805)  nitroGLYCERIN (NITROGLYN) 2 %  ointment 1 inch (1 inch Topical Given 01/11/20 1858)  dexamethasone (DECADRON) injection 6 mg (6 mg Intravenous Given 01/11/20 1803)  acetaminophen (TYLENOL) tablet 650 mg (650 mg Oral Given 01/11/20 1741)  amLODipine (NORVASC) tablet 10 mg (10 mg Oral Given 01/11/20 1904)  albuterol (PROVENTIL,VENTOLIN) solution continuous neb (10 mg/hr Nebulization Given 01/11/20 1932)  ibuprofen (ADVIL) tablet 600 mg (600 mg Oral Given 01/11/20 1904)  hydrALAZINE (APRESOLINE) injection 10 mg (10 mg Intravenous Given 01/11/20 1915)        Consult Orders  (From admission, onward)         Start     Ordered   01/11/20 2013  Consult to hospitalist  ALL PATIENTS BEING ADMITTED/HAVING PROCEDURES NEED COVID-19 SCREENING  Once    Comments: ALL PATIENTS BEING ADMITTED/HAVING  PROCEDURES NEED COVID-19 SCREENING  Provider:  (Not yet assigned)  Question Answer Comment  Place call to: [facility]   Reason for Consult Admit      01/11/20 2012           Significant initial  Findings: Abnormal Labs Reviewed  BASIC METABOLIC PANEL - Abnormal; Notable for the following components:      Result Value   Glucose, Bld 164 (*)    All other components within normal limits  CBC - Abnormal; Notable for the following components:   WBC 15.1 (*)    Platelets 473 (*)    All other components within normal limits    Otherwise labs showing:    Recent Labs  Lab 01/11/20 1647  NA 140  K 4.7  CO2 31  GLUCOSE 164*  BUN 7  CREATININE 0.86  CALCIUM 8.9    Cr   Stable,  Lab Results  Component Value Date   CREATININE 0.86 01/11/2020   CREATININE 0.86 12/06/2018   CREATININE 0.82 04/01/2017    No results for input(s): AST, ALT, ALKPHOS, BILITOT, PROT, ALBUMIN in the last 168 hours. Lab Results  Component Value Date   CALCIUM 8.9 01/11/2020      WBC      Component Value Date/Time   WBC 15.1 (H) 01/11/2020 1647   ANC    Component Value Date/Time   NEUTROABS 6.1 04/17/2015 0520   ALC No components found for: LYMPHAB    Plt: Lab Results  Component Value Date   PLT 473 (H) 01/11/2020      COVID-19 Labs  No results for input(s): DDIMER, FERRITIN, LDH, CRP in the last 72 hours.  Lab Results  Component Value Date   [place] NEGATIVE 01/11/2020      HG/HCT  stable,      Component Value Date/Time   HGB 13.9 01/11/2020 1647   HCT 44.2 01/11/2020 1647       ECG: Ordered Personally reviewed by me showing: HR : 119 Rhythm:   Sinus tachycardia   no evidence of ischemic changes QTC 435   BNP (last 3 results) Recent Labs    01/11/20 1647  BNP 45.5     UA not ordered     Ordered   CXR - bronchitis      ED Triage Vitals  Enc Vitals Group     BP 01/11/20 1554 (!) 221/112     Pulse Rate 01/11/20 1554 (!) 130     Resp  01/11/20 1554 (!) 21     Temp 01/11/20 1554 98 F (36.7 C)     Temp Source 01/11/20 1554 Oral     SpO2 01/11/20 1554 (!) 88 %  Weight 01/11/20 1559 283 lb (128.4 kg)     Height 01/11/20 1559 '5\' 2"'$  (1.575 m)     Head Circumference --      Peak Flow --      Pain Score --      Pain Loc --      Pain Edu? --      Excl. in [place]? --   TMAX(24)@       Latest  Blood pressure (!) 185/113, pulse (!) 107, temperature 98 F (36.7 C), temperature source Oral, resp. rate 20, height '5\' 2"'$  (1.575 m), weight 128.4 kg, SpO2 96 %.    Review of Systems:    Pertinent positives include: shortness of breath at rest.  Constitutional:  No weight loss, night sweats, Fevers, chills, fatigue, weight loss  HEENT:  No headaches, Difficulty swallowing,Tooth/dental problems,Sore throat,  No sneezing, itching, ear ache, nasal congestion, post nasal drip,  Cardio-vascular:  No chest pain, Orthopnea, PND, anasarca, dizziness, palpitations.no Bilateral lower extremity swelling  GI:  No heartburn, indigestion, abdominal pain, nausea, vomiting, diarrhea, change in bowel habits, loss of appetite, melena, blood in stool, hematemesis Resp:  no  No dyspnea on exertion, No excess mucus, no productive cough, No non-productive cough, No coughing up of blood.No change in color of mucus.No wheezing. Skin:  no rash or lesions. No jaundice GU:  no dysuria, change in color of urine, no urgency or frequency. No straining to urinate.  No flank pain.  Musculoskeletal:  No joint pain or no joint swelling. No decreased range of motion. No back pain.  Psych:  No change in mood or affect. No depression or anxiety. No memory loss.  Neuro: no localizing neurological complaints, no tingling, no weakness, no double vision, no gait abnormality, no slurred speech, no confusion  All systems reviewed and apart from [place] all are negative  Past Medical History:   Past Medical History:  Diagnosis Date  . Asthma   . Depression   .  Diabetes mellitus without complication ([place])    pt states was gestational with previous pregnancy  . Headache   . Hypertension   . Panic attacks   . Pneumonia   . Stress       Past Surgical History:  Procedure Laterality Date  . INDUCED ABORTION      Social History:  Ambulatory   Independently      reports that she has been smoking cigarettes. She has a 4.50 pack-year smoking history. She has never used smokeless tobacco. She reports current alcohol use. She reports that she does not use drugs.    Family History:   Family History  Problem Relation Age of Onset  . Hypertension Mother   . Heart failure Mother   . Diabetes Father   . CAD Other     Allergies: Allergies  Allergen Reactions  . Other Anaphylaxis    Crawfish   . Amoxicillin Hives    Has patient had a PCN reaction causing immediate rash, facial/tongue/throat swelling, SOB or lightheadedness with hypotension:unsure Has patient had a PCN reaction causing severe rash involving mucus membranes or skin necrosis:No Has patient had a PCN reaction that required hospitalization:No Has patient had a PCN reaction occurring within the last 10 years: No If all of the above answers are "NO", then may proceed with Cephalosporin use.   Marland Kitchen Peanut-Containing Drug Products Itching  . Penicillins Itching    Has patient had a PCN reaction causing immediate rash, facial/tongue/throat swelling, SOB or lightheadedness with hypotension:unsure Has  patient had a PCN reaction causing severe rash involving mucus membranes or skin necrosis:No Has patient had a PCN reaction that required hospitalization:No Has patient had a PCN reaction occurring within the last 10 years: No If all of the above answers are "NO", then may proceed with Cephalosporin use.    . Strawberry Extract Hives, Itching and Rash     Prior to Admission medications   Medication Sig Start Date End Date Taking? Authorizing Provider  atenolol-chlorthalidone  (TENORETIC) 50-25 MG tablet Take 1 tablet by mouth daily.   Yes [provider]  etonogestrel (NEXPLANON) 68 MG IMPL implant 1 each by Subdermal route once.   Yes [provider]  FLUoxetine (PROZAC) 10 MG capsule Take 10 mg by mouth daily. 09/03/19  Yes [provider]  ibuprofen (ADVIL) 800 MG tablet Take 1 tablet (800 mg total) by mouth every 8 (eight) hours as needed. Patient taking differently: Take 800 mg by mouth every 8 (eight) hours as needed for fever or moderate pain.  11/21/19  Yes Shelly Bombard, MD  meloxicam (MOBIC) 15 MG tablet Take 15 mg by mouth daily. 09/03/19  Yes [provider]  megestrol (MEGACE) 40 MG tablet Take 1 tab po bid x 7 days, then take 1 tab po daily. Patient not taking: Reported on 01/11/2020 11/22/19   Shelly Bombard, MD   Physical Exam: Blood pressure (!) 185/113, pulse (!) 107, temperature 98 F (36.7 C), temperature source Oral, resp. rate 20, height '5\' 2"'$  (1.575 m), weight 128.4 kg, SpO2 96 %. 1. General:  in No Acute distress    Chronically il -appearing 2. Psychological: Alert and   Oriented 3. Head/ENT:     Dry Mucous Membranes                          Head Non traumatic, neck supple                            Poor Dentition 4. SKIN:  decreased Skin turgor,  Skin clean Dry and intact no rash 5. Heart: Regular rate and rhythm no Murmur, no Rub or gallop 6. Lungs:   no wheezes or crackles   7. Abdomen: Soft, non-tender, Non distended  Obese bowel sounds present 8. Lower extremities: no clubbing, cyanosis, no edema 9. Neurologically Grossly intact, moving all 4 extremities equally   10. MSK: Normal range of motion   All other LABS:     Recent Labs  Lab 01/11/20 1647  WBC 15.1*  HGB 13.9  HCT 44.2  MCV 91.3  PLT 473*     Recent Labs  Lab 01/11/20 1647  NA 140  K 4.7  CL 103  CO2 31  GLUCOSE 164*  BUN 7  CREATININE 0.86  CALCIUM 8.9     No results for input(s): AST, ALT, ALKPHOS, BILITOT,  PROT, ALBUMIN in the last 168 hours.     Cultures:    Component Value Date/Time   SDES URINE, CLEAN CATCH 03/25/2015 1300   SPECREQUEST NONE 03/25/2015 1300   CULT  03/25/2015 1300    MULTIPLE SPECIES PRESENT, SUGGEST RECOLLECTION IF CLINICALLY INDICATED Performed at [place] 03/27/2015 FINAL 03/25/2015 1300     Radiological Exams on Admission: DG Chest 2 View  Result Date: 01/11/2020 CLINICAL DATA:  Cough, shortness of breath and myalgias. Smoker. EXAM: CHEST - 2 VIEW COMPARISON:  11/18/2016 FINDINGS:  Normal sized heart. Clear lungs with normal vascularity. Mild peribronchial thickening with scratch the interval mild peribronchial thickening and interval flattening of the hemidiaphragms on the lateral view. Mild thoracic spine degenerative changes. IMPRESSION: Interval mild changes of COPD and chronic bronchitis with possible superimposed acute bronchitis. Electronically Signed   By: Claudie Revering M.D.   On: 01/11/2020 16:48    Chart has been reviewed    Assessment/Plan  39 y.o. female with medical history significant of HTN, childhood asthma, tobacco abuse, DM 2, MOrbid Obesity  Admitted for asthma/copd exaerbation  Present on Admission: . Asthma exacerbation -given ongoing tobacco abuse patient is also at risk for COPD exacerbation.  Continue Xopenex as needed.  Initiate steroid taper Given severe tachycardia monitor in stepdown. Monitor for any hypoxia Scheduled DuoNeb  . Morbid obesity ([place]) -will need to nutritional assessment and follow   . Bronchitis due to tobacco use -add azithromycin continue as needed nebulizer treatment monitor for any evidence of hypoxia encourage discontinuation tobacco abuse  obtain viral panel Covid negative Spoke about importance of getting vaccination   . Tobacco abuse -  - Spoke about importance of quitting spent 5 minutes discussing options for treatment, prior attempts at quitting, and dangers of smoking  -At  this point patient is  interested in quitting  - order nicotine patch   - nursing tobacco cessation protocol  . Hypertensive urgency -in the setting of anxiety and medical noncompliance will restart home medications patient used to be on a beta-blocker.  Will switch to Zebeta given asthma and hydralazine as needed  . Tachycardia -in the setting of getting nebulizer treatment and anxiety and discontinuation of beta-blocker. Monitor on telemetry check TSH gently rehydrate  Start Zebeta Given dyspnea and tachycardia check deep dimer if positive will obtain CT  History of diabetes mellitus states currently resolved will check hemoglobin A1c and treat if needed Other plan as per orders.  DVT prophylaxis:    Lovenox     Code Status:  FULL CODE   as per patient   I had personally discussed CODE STATUS with patient    Family Communication:   Family not at  Bedside    Disposition Plan:       To home once workup is complete and patient is stable   Following barriers for discharge:                           Improved work of breathing and resolution of tachycardia                       Consults called: none  Admission status:  ED Disposition    ED Disposition Condition [place]: [place] [[id number]]  Level of Care: Stepdown [14]  Admit to SDU based on following criteria: Respiratory Distress:  Frequent assessment and/or intervention to maintain adequate ventilation/respiration, pulmonary toilet, and respiratory treatment.  Admit to SDU based on following criteria: Hemodynamic compromise or significant risk of instability:  Patient requiring short term acute titration and management of vasoactive drips, and invasive monitoring (i.e., CVP and Arterial line).  May admit patient to [facility] or [facility] if equivalent level of care is available:: No  Covid Evaluation: Symptomatic Person Under Investigation (PUI)  Diagnosis: Asthma exacerbation  [329518]  Admitting Physician: Toy Baker [[id number]]  Attending Physician: Toy Baker [[id number]]  Estimated length of stay: 3 - 4 days  Certification:: I certify this patient will need inpatient services for at least 2 midnights        inpatient     I Expect 2 midnight stay secondary to severity of patient's current illness need for inpatient interventions justified by the following:  hemodynamic instability despite optimal treatment (tachycardia )   and extensive comorbidities including:    COPD/asthma  Morbid Obesity   That are currently affecting medical management.   I expect  patient to be hospitalized for 2 midnights requiring inpatient medical care.  Patient is at high risk for adverse outcome (such as loss of life or disability) if not treated.  Indication for inpatient stay as follows:    Hemodynamic instability despite maximal medical therapy,     Need for IV antibiotics, IV fluids,       Level of care        SDU tele indefinitely please discontinue once patient no longer qualifies   Precautions: admitted as  covid negative  No active isolations  If Covid PCR is negative     PPE: Used by the provider:   P100  eye Goggles,  Gloves  gown    Quentavious Rittenhouse 01/11/2020, 11:33 PM    [facility]     after 2 AM please page floor coverage PA If 7AM-7PM, please contact the day team taking care of the patient using [email or web address]   Patient was evaluated in the context of the global COVID-19 pandemic, which necessitated consideration that the patient might be at risk for infection with the SARS-CoV-2 virus that causes COVID-19. Institutional protocols and algorithms that pertain to the evaluation of patients at risk for COVID-19 are in a state of rapid change based on information released by regulatory bodies including the CDC and federal and state organizations. These policies and algorithms were followed during the patient's care.

## 2020-01-11 NOTE — ED Provider Notes (Addendum)
[place] DEPT Provider Note   CSN: [id number] Arrival date & time: 01/11/20  1550     History Chief Complaint  Patient presents with  . Shortness of Breath  . Hypertension    Katherine Douglas is a 39 y.o. female.  HPI    Patient presented to emergency room for evaluation of shortness of breath.  Patient states she started having trouble with cough and body aches couple days ago.  Today she felt acutely worse and very short of breath.  Patient states she had asthma as a child but has not had any issues as an adult.  She does smoke.  She denies any Covid exposures.  She has not been vaccinated.  She denies any chest pain or leg swelling.    Past Medical History:  Diagnosis Date  . Asthma   . Depression   . Diabetes mellitus without complication ([place])    pt states was gestational with previous pregnancy  . Headache   . Hypertension   . Panic attacks   . Pneumonia   . Stress     Patient Active Problem List   Diagnosis Date Noted  . Morbid obesity ([place]) 07/05/2017  . Multiparity 04/14/2015    Past Surgical History:  Procedure Laterality Date  . INDUCED ABORTION       OB History    Gravida  5   Para  3   Term  3   Preterm      AB  2   Living  3     SAB      TAB  2   Ectopic      Multiple  0   Live Births  3           Family History  Problem Relation Age of Onset  . Hypertension Mother   . Heart failure Mother   . Diabetes Father   . CAD Other     Social History   Tobacco Use  . Smoking status: Current Every Day Smoker    Packs/day: 0.25    Years: 18.00    Pack years: 4.50    Types: Cigarettes    Last attempt to quit: 09/15/2014    Years since quitting: 5.3  . Smokeless tobacco: Never Used  Substance Use Topics  . Alcohol use: Yes  . Drug use: No    Frequency: 4.0 times per week    Home Medications Prior to Admission medications   Medication Sig Start Date End Date Taking? Authorizing  Provider  atenolol-chlorthalidone (TENORETIC) 50-25 MG tablet Take 1 tablet by mouth daily.   Yes [provider]  etonogestrel (NEXPLANON) 68 MG IMPL implant 1 each by Subdermal route once.   Yes [provider]  FLUoxetine (PROZAC) 10 MG capsule Take 10 mg by mouth daily. 09/03/19  Yes [provider]  ibuprofen (ADVIL) 800 MG tablet Take 1 tablet (800 mg total) by mouth every 8 (eight) hours as needed. Patient taking differently: Take 800 mg by mouth every 8 (eight) hours as needed for fever or moderate pain.  11/21/19  Yes Shelly Bombard, MD  meloxicam (MOBIC) 15 MG tablet Take 15 mg by mouth daily. 09/03/19  Yes [provider]  megestrol (MEGACE) 40 MG tablet Take 1 tab po bid x 7 days, then take 1 tab po daily. Patient not taking: Reported on 01/11/2020 11/22/19   Shelly Bombard, MD    Allergies    Other, Amoxicillin, Peanut-containing drug products,  Penicillins, and Strawberry extract  Review of Systems   Review of Systems  All other systems reviewed and are negative.   Physical Exam Updated Vital Signs BP (!) 185/113   Pulse (!) 107   Temp 98 F (36.7 C) (Oral)   Resp 20   Ht 1.575 m (5\' 2" )   Wt 128.4 kg   SpO2 96%   BMI 51.76 kg/m   Physical Exam Vitals and nursing note reviewed.  Constitutional:      General: She is not in acute distress.    Appearance: She is well-developed.  HENT:     Head: Normocephalic and atraumatic.     Right Ear: External ear normal.     Left Ear: External ear normal.  Eyes:     General: No scleral icterus.       Right eye: No discharge.        Left eye: No discharge.     Conjunctiva/sclera: Conjunctivae normal.  Neck:     Trachea: No tracheal deviation.  Cardiovascular:     Rate and Rhythm: Regular rhythm. Tachycardia present.  Pulmonary:     Effort: Pulmonary effort is normal. No respiratory distress.     Breath sounds: No stridor. Wheezing present. No rales.  Abdominal:     General: Bowel  sounds are normal. There is no distension.     Palpations: Abdomen is soft.     Tenderness: There is no abdominal tenderness. There is no guarding or rebound.  Musculoskeletal:        General: No tenderness.     Cervical back: Neck supple.     Right lower leg: No tenderness. No edema.     Left lower leg: No tenderness. No edema.  Skin:    General: Skin is warm and dry.     Findings: No rash.  Neurological:     Mental Status: She is alert.     Cranial Nerves: No cranial nerve deficit (no facial droop, extraocular movements intact, no slurred speech).     Sensory: No sensory deficit.     Motor: No abnormal muscle tone or seizure activity.     Coordination: Coordination normal.     ED Results / Procedures / Treatments   Labs (all labs ordered are listed, but only abnormal results are displayed) Labs Reviewed  BASIC METABOLIC PANEL - Abnormal; Notable for the following components:      Result Value   Glucose, Bld 164 (*)    All other components within normal limits  CBC - Abnormal; Notable for the following components:   WBC 15.1 (*)    Platelets 473 (*)    All other components within normal limits  SARS CORONAVIRUS 2 (TAT 6-24 HRS)  BRAIN NATRIURETIC PEPTIDE  POC SARS CORONAVIRUS 2 AG -  ED    EKG EKG Interpretation  Date/Time:  Saturday January 11 2020 15:57:44 EDT Ventricular Rate:  119 PR Interval:    QRS Duration: 74 QT Interval:  309 QTC Calculation: 435 R Axis:   79 Text Interpretation: Sinus tachycardia Since last tracing rate faster Confirmed by Dorie Rank [phone]) on 01/11/2020 4:38:11 PM   Radiology DG Chest 2 View  Result Date: 01/11/2020 CLINICAL DATA:  Cough, shortness of breath and myalgias. Smoker. EXAM: CHEST - 2 VIEW COMPARISON:  11/18/2016 FINDINGS: Normal sized heart. Clear lungs with normal vascularity. Mild peribronchial thickening with scratch the interval mild peribronchial thickening and interval flattening of the hemidiaphragms on the lateral  view. Mild thoracic spine degenerative changes.  IMPRESSION: Interval mild changes of COPD and chronic bronchitis with possible superimposed acute bronchitis. Electronically Signed   By: Claudie Revering M.D.   On: 01/11/2020 16:48    Procedures .Critical Care Performed by: Dorie Rank, MD Authorized by: Dorie Rank, MD   Critical care provider statement:    Critical care time (minutes):  35   Critical care was time spent personally by me on the following activities:  Discussions with consultants, evaluation of patient's response to treatment, examination of patient, ordering and performing treatments and interventions, ordering and review of laboratory studies, ordering and review of radiographic studies, pulse oximetry, re-evaluation of patient's condition, obtaining history from patient or surrogate and review of old charts   (including critical care time)  Medications Ordered in ED Medications  albuterol (VENTOLIN HFA) 108 (90 Base) MCG/ACT inhaler 4 puff (4 puffs Inhalation Refused 01/11/20 1805)  aerochamber Z-Stat Plus/medium 1 each (1 each Other Not Given 01/11/20 1805)  nitroGLYCERIN (NITROGLYN) 2 % ointment 1 inch (1 inch Topical Given 01/11/20 1858)  dexamethasone (DECADRON) injection 6 mg (6 mg Intravenous Given 01/11/20 1803)  acetaminophen (TYLENOL) tablet 650 mg (650 mg Oral Given 01/11/20 1741)  amLODipine (NORVASC) tablet 10 mg (10 mg Oral Given 01/11/20 1904)  albuterol (PROVENTIL,VENTOLIN) solution continuous neb (10 mg/hr Nebulization Given 01/11/20 1932)  ibuprofen (ADVIL) tablet 600 mg (600 mg Oral Given 01/11/20 1904)  hydrALAZINE (APRESOLINE) injection 10 mg (10 mg Intravenous Given 01/11/20 1915)    ED Course  I have reviewed the triage vital signs and the nursing notes.  Pertinent labs & imaging results that were available during my care of the patient were reviewed by me and considered in my medical decision making (see chart for details).  Clinical Course as of Jan 12 1540   Sat Jan 11, 2020  1903 BP still elevated.  Pt has not been taking medications.    [JK]  1929 BP improving   [JK]  2013 Patient is breathing is improving with albuterol treatments.  However she still has wheezing.  She still is hypertensive.  Hydralazine IV ordered   [JK]  2051 I    [JK]    Clinical Course User Index [JK] Dorie Rank, MD   MDM Rules/Calculators/A&P                      Patient presented to ED with shortness of breath.  Patient is a smoker.  Exacerbation is consistent with a COPD exacerbation.  No evidence of pneumonia or Covid on initial laboratory testing and x-rays.  Despite her hypertension BNP is not elevated, argues against CHF exacerbation.  Patient was given IV and oral blood pressure medications.  She does have a history of hypertension and has not been taking her medications.  Considering her persistent symptoms I think she would benefit from overnight hospitalization treatment of her COPD exacerbation as well as her hypertensive urgency. Final Clinical Impression(s) / ED Diagnoses Final diagnoses:  COPD exacerbation ([place])  Hypertensive urgency         Dorie Rank, MD 01/11/20 2015 Ed course update   Dorie Rank, MD 01/12/20 [phone]

## 2020-01-12 DIAGNOSIS — R739 Hyperglycemia, unspecified: Secondary | ICD-10-CM | POA: Diagnosis present

## 2020-01-12 DIAGNOSIS — F419 Anxiety disorder, unspecified: Secondary | ICD-10-CM

## 2020-01-12 DIAGNOSIS — D72829 Elevated white blood cell count, unspecified: Secondary | ICD-10-CM | POA: Diagnosis present

## 2020-01-12 LAB — HEMOGLOBIN A1C
Hgb A1c MFr Bld: 7.1 % — ABNORMAL HIGH (ref 4.8–5.6)
Mean Plasma Glucose: 157.07 mg/dL

## 2020-01-12 LAB — CBC WITH DIFFERENTIAL/PLATELET
Abs Immature Granulocytes: 0.15 10*3/uL — ABNORMAL HIGH (ref 0.00–0.07)
Basophils Absolute: 0 10*3/uL (ref 0.0–0.1)
Basophils Relative: 0 %
Eosinophils Absolute: 0 10*3/uL (ref 0.0–0.5)
Eosinophils Relative: 0 %
HCT: 39.1 % (ref 36.0–46.0)
Hemoglobin: 12.3 g/dL (ref 12.0–15.0)
Immature Granulocytes: 1 %
Lymphocytes Relative: 6 %
Lymphs Abs: 1 10*3/uL (ref 0.7–4.0)
MCH: 28.1 pg (ref 26.0–34.0)
MCHC: 31.5 g/dL (ref 30.0–36.0)
MCV: 89.5 fL (ref 80.0–100.0)
Monocytes Absolute: 0.2 10*3/uL (ref 0.1–1.0)
Monocytes Relative: 1 %
Neutro Abs: 17 10*3/uL — ABNORMAL HIGH (ref 1.7–7.7)
Neutrophils Relative %: 92 %
Platelets: 455 10*3/uL — ABNORMAL HIGH (ref 150–400)
RBC: 4.37 MIL/uL (ref 3.87–5.11)
RDW: 14 % (ref 11.5–15.5)
WBC: 18.5 10*3/uL — ABNORMAL HIGH (ref 4.0–10.5)
nRBC: 0 % (ref 0.0–0.2)

## 2020-01-12 LAB — COMPREHENSIVE METABOLIC PANEL
ALT: 22 U/L (ref 0–44)
AST: 20 U/L (ref 15–41)
Albumin: 3.4 g/dL — ABNORMAL LOW (ref 3.5–5.0)
Alkaline Phosphatase: 86 U/L (ref 38–126)
Anion gap: 9 (ref 5–15)
BUN: 11 mg/dL (ref 6–20)
CO2: 22 mmol/L (ref 22–32)
Calcium: 9.2 mg/dL (ref 8.9–10.3)
Chloride: 105 mmol/L (ref 98–111)
Creatinine, Ser: 1 mg/dL (ref 0.44–1.00)
GFR calc Af Amer: >60 mL/min (ref >60)
GFR calc non Af Amer: >60 mL/min (ref >60)
Glucose, Bld: 285 mg/dL — ABNORMAL HIGH (ref 70–99)
Potassium: 4.1 mmol/L (ref 3.5–5.1)
Sodium: 136 mmol/L (ref 135–145)
Total Bilirubin: 0.6 mg/dL (ref 0.3–1.2)
Total Protein: 7 g/dL (ref 6.5–8.1)

## 2020-01-12 LAB — RAPID URINE DRUG SCREEN, HOSP PERFORMED
Amphetamines: NOT DETECTED
Barbiturates: NOT DETECTED
Benzodiazepines: NOT DETECTED
Cocaine: NOT DETECTED
Opiates: POSITIVE — AB
Tetrahydrocannabinol: POSITIVE — AB

## 2020-01-12 LAB — MAGNESIUM: Magnesium: 2.1 mg/dL (ref 1.7–2.4)

## 2020-01-12 LAB — EXPECTORATED SPUTUM ASSESSMENT W GRAM STAIN, RFLX TO RESP C

## 2020-01-12 LAB — URINALYSIS, ROUTINE W REFLEX MICROSCOPIC
Bacteria, UA: NONE SEEN
Bilirubin Urine: NEGATIVE
Glucose, UA: >=500 mg/dL — AB
Ketones, ur: 5 mg/dL — AB
Leukocytes,Ua: NEGATIVE
Nitrite: NEGATIVE
Protein, ur: 30 mg/dL — AB
RBC / HPF: >50 RBC/hpf — ABNORMAL HIGH (ref 0–5)
Specific Gravity, Urine: 1.028 (ref 1.005–1.030)
pH: 6 (ref 5.0–8.0)

## 2020-01-12 LAB — TROPONIN I (HIGH SENSITIVITY): Troponin I (High Sensitivity): 13 ng/L (ref <18)

## 2020-01-12 LAB — GLUCOSE, CAPILLARY
Glucose-Capillary: 240 mg/dL — ABNORMAL HIGH (ref 70–99)
Glucose-Capillary: 200 mg/dL — ABNORMAL HIGH (ref 70–99)
Glucose-Capillary: 286 mg/dL — ABNORMAL HIGH (ref 70–99)

## 2020-01-12 LAB — D-DIMER, QUANTITATIVE: D-Dimer, Quant: <0.27 ug/mL-FEU (ref 0.00–0.50)

## 2020-01-12 LAB — TSH: TSH: 0.613 u[IU]/mL (ref 0.350–4.500)

## 2020-01-12 LAB — PHOSPHORUS: Phosphorus: 1.7 mg/dL — ABNORMAL LOW (ref 2.5–4.6)

## 2020-01-12 LAB — HIV ANTIBODY (ROUTINE TESTING W REFLEX): HIV Screen 4th Generation wRfx: NONREACTIVE

## 2020-01-12 MED ORDER — FLUOXETINE HCL 10 MG PO CAPS
10.0000 mg | ORAL_CAPSULE | Freq: Every day | ORAL | Status: DC
  Filled 2020-01-12: qty 1

## 2020-01-12 MED ORDER — CHLORTHALIDONE 25 MG PO TABS: 25.0000 mg | ORAL_TABLET | Freq: Every day | ORAL | Status: DC

## 2020-01-12 MED ORDER — FLUOXETINE HCL 10 MG PO CAPS
10.0000 mg | ORAL_CAPSULE | Freq: Every day | ORAL | Status: DC
  Administered 2020-01-12 – 2020-01-15 (×4): 10 mg via ORAL
  Filled 2020-01-12 (×4): qty 1

## 2020-01-12 MED ORDER — HYDRALAZINE HCL 20 MG/ML IJ SOLN: 10.0000 mg | INTRAMUSCULAR | Status: DC | PRN

## 2020-01-12 MED ORDER — SODIUM CHLORIDE 0.9% FLUSH
3.0000 mL | Freq: Two times a day (BID) | INTRAVENOUS | Status: DC
  Administered 2020-01-12 – 2020-01-13 (×3): 3 mL via INTRAVENOUS

## 2020-01-12 MED ORDER — HYDROCODONE-ACETAMINOPHEN 5-325 MG PO TABS
1.0000 | ORAL_TABLET | ORAL | Status: DC | PRN
  Administered 2020-01-12 (×2): 1 via ORAL
  Administered 2020-01-13 (×2): 2 via ORAL
  Administered 2020-01-14: 1 via ORAL
  Filled 2020-01-12 (×2): qty 1
  Filled 2020-01-12 (×2): qty 2
  Filled 2020-01-12: qty 1

## 2020-01-12 MED ORDER — LEVALBUTEROL HCL 0.63 MG/3ML IN NEBU
0.6300 mg | INHALATION_SOLUTION | Freq: Four times a day (QID) | RESPIRATORY_TRACT | Status: DC | PRN
  Administered 2020-01-14: 0.63 mg via RESPIRATORY_TRACT
  Filled 2020-01-12: qty 3

## 2020-01-12 MED ORDER — ONDANSETRON HCL 4 MG PO TABS: 4.0000 mg | ORAL_TABLET | Freq: Four times a day (QID) | ORAL | Status: DC | PRN

## 2020-01-12 MED ORDER — SODIUM CHLORIDE 0.9% FLUSH
3.0000 mL | Freq: Two times a day (BID) | INTRAVENOUS | Status: DC
  Administered 2020-01-12 – 2020-01-14 (×6): 3 mL via INTRAVENOUS

## 2020-01-12 MED ORDER — NICOTINE 21 MG/24HR TD PT24
21.0000 mg | MEDICATED_PATCH | Freq: Every day | TRANSDERMAL | Status: DC
  Administered 2020-01-12 – 2020-01-15 (×4): 21 mg via TRANSDERMAL
  Filled 2020-01-12 (×4): qty 1

## 2020-01-12 MED ORDER — AZITHROMYCIN 250 MG PO TABS
500.0000 mg | ORAL_TABLET | Freq: Every day | ORAL | Status: DC
  Administered 2020-01-12 – 2020-01-14 (×3): 500 mg via ORAL
  Filled 2020-01-12 (×3): qty 2

## 2020-01-12 MED ORDER — BISOPROLOL FUMARATE 5 MG PO TABS: 5.0000 mg | ORAL_TABLET | Freq: Every day | ORAL | Status: DC

## 2020-01-12 MED ORDER — SODIUM CHLORIDE 0.9 % IV SOLN
500.0000 mg | INTRAVENOUS | Status: AC
  Administered 2020-01-12: 01:00:00 500 mg via INTRAVENOUS
  Filled 2020-01-12: qty 500

## 2020-01-12 MED ORDER — ENOXAPARIN SODIUM 60 MG/0.6ML ~~LOC~~ SOLN
60.0000 mg | Freq: Every day | SUBCUTANEOUS | Status: DC
  Administered 2020-01-12 – 2020-01-14 (×4): 60 mg via SUBCUTANEOUS
  Filled 2020-01-12 (×5): qty 0.6

## 2020-01-12 MED ORDER — BENZONATATE 100 MG PO CAPS
100.0000 mg | ORAL_CAPSULE | Freq: Three times a day (TID) | ORAL | Status: DC | PRN
  Administered 2020-01-12 – 2020-01-15 (×5): 100 mg via ORAL
  Filled 2020-01-12 (×5): qty 1

## 2020-01-12 MED ORDER — AMLODIPINE BESYLATE 10 MG PO TABS
10.0000 mg | ORAL_TABLET | Freq: Every day | ORAL | Status: DC
  Administered 2020-01-12 – 2020-01-15 (×4): 10 mg via ORAL
  Filled 2020-01-12 (×4): qty 1

## 2020-01-12 MED ORDER — DOCUSATE SODIUM 100 MG PO CAPS
100.0000 mg | ORAL_CAPSULE | Freq: Two times a day (BID) | ORAL | Status: DC
  Administered 2020-01-12 – 2020-01-15 (×8): 100 mg via ORAL
  Filled 2020-01-12 (×8): qty 1

## 2020-01-12 MED ORDER — INSULIN ASPART 100 UNIT/ML ~~LOC~~ SOLN
4.0000 [IU] | Freq: Once | SUBCUTANEOUS | Status: AC
  Administered 2020-01-12: 4 [IU] via SUBCUTANEOUS

## 2020-01-12 MED ORDER — ACETAMINOPHEN 325 MG PO TABS
650.0000 mg | ORAL_TABLET | Freq: Four times a day (QID) | ORAL | Status: DC | PRN
  Administered 2020-01-12 – 2020-01-15 (×3): 650 mg via ORAL
  Filled 2020-01-12 (×3): qty 2

## 2020-01-12 MED ORDER — PREDNISONE 20 MG PO TABS
40.0000 mg | ORAL_TABLET | Freq: Every day | ORAL | Status: DC
  Administered 2020-01-13 – 2020-01-15 (×3): 40 mg via ORAL
  Filled 2020-01-12 (×3): qty 2

## 2020-01-12 MED ORDER — METHYLPREDNISOLONE SODIUM SUCC 40 MG IJ SOLR
40.0000 mg | Freq: Two times a day (BID) | INTRAMUSCULAR | Status: AC
  Administered 2020-01-12 (×2): 40 mg via INTRAVENOUS
  Filled 2020-01-12 (×2): qty 1

## 2020-01-12 MED ORDER — SODIUM CHLORIDE 0.9 % IV SOLN: INTRAVENOUS | Status: AC

## 2020-01-12 MED ORDER — ONDANSETRON HCL 4 MG/2ML IJ SOLN: 4.0000 mg | Freq: Four times a day (QID) | INTRAMUSCULAR | Status: DC | PRN

## 2020-01-12 MED ORDER — ACETAMINOPHEN 650 MG RE SUPP: 650.0000 mg | Freq: Four times a day (QID) | RECTAL | Status: DC | PRN

## 2020-01-12 MED ORDER — INSULIN ASPART 100 UNIT/ML ~~LOC~~ SOLN
0.0000 [IU] | Freq: Three times a day (TID) | SUBCUTANEOUS | Status: DC
  Administered 2020-01-12: 1 [IU] via SUBCUTANEOUS
  Administered 2020-01-12 – 2020-01-13 (×2): 2 [IU] via SUBCUTANEOUS
  Administered 2020-01-13: 3 [IU] via SUBCUTANEOUS
  Administered 2020-01-14 (×2): 1 [IU] via SUBCUTANEOUS
  Administered 2020-01-15: 2 [IU] via SUBCUTANEOUS

## 2020-01-12 MED ORDER — LORAZEPAM 1 MG PO TABS
1.0000 mg | ORAL_TABLET | Freq: Two times a day (BID) | ORAL | Status: DC | PRN
  Administered 2020-01-12 – 2020-01-15 (×5): 1 mg via ORAL
  Filled 2020-01-12 (×5): qty 1

## 2020-01-12 MED ORDER — IPRATROPIUM-ALBUTEROL 0.5-2.5 (3) MG/3ML IN SOLN
3.0000 mL | Freq: Four times a day (QID) | RESPIRATORY_TRACT | Status: DC
  Administered 2020-01-12 (×4): 3 mL via RESPIRATORY_TRACT
  Filled 2020-01-12 (×4): qty 3

## 2020-01-12 MED ORDER — LISINOPRIL 20 MG PO TABS
20.0000 mg | ORAL_TABLET | Freq: Every day | ORAL | Status: DC
  Administered 2020-01-12 – 2020-01-15 (×4): 20 mg via ORAL
  Filled 2020-01-12 (×2): qty 2
  Filled 2020-01-12: qty 1

## 2020-01-12 MED ORDER — CHLORHEXIDINE GLUCONATE CLOTH 2 % EX PADS
6.0000 | MEDICATED_PAD | Freq: Every day | CUTANEOUS | Status: DC
  Administered 2020-01-12 – 2020-01-15 (×4): 6 via TOPICAL

## 2020-01-12 MED ORDER — MOMETASONE FURO-FORMOTEROL FUM 200-5 MCG/ACT IN AERO
1.0000 | INHALATION_SPRAY | Freq: Two times a day (BID) | RESPIRATORY_TRACT | Status: DC
  Administered 2020-01-12 – 2020-01-15 (×7): 1 via RESPIRATORY_TRACT
  Filled 2020-01-12: qty 8.8

## 2020-01-12 NOTE — Progress Notes (Signed)
Patient's HS BS=286, page on call MD for coverage. Awaiting order and will continue to monitor patient.

## 2020-01-12 NOTE — Progress Notes (Signed)
[facility]  PROGRESS NOTE  Katherine Douglas [id number] DOB: 04-May-1981 DOA: 01/11/2020 PCP: Trey Sailors, PA Admit date - 01/11/2020   Admitting Physician Toy Baker, MD  Outpatient Primary MD for the patient is Trey Sailors, PA  LOS - 1 Brief Narrative   Katherine Douglas is a 39 y.o. year old female with medical history significant for HTN nonadherence to atenolol, tobacco use, anxiety who presented on 01/11/2020 with complaints of worsening headache, congestion, generalized body aches, shortness of breath with exertion.  She states she noticed her symptoms started on Friday with headache and congestion. Felt like a normal sinus cold. She took a benadryl with minimal improvement. She knew it was getting worse because of chest tightness followed by coughing.  Tried her albuterol inhaler which made it worse. The next day tried lemon/honey tea and lozenges and stay hydrated with water. She had mouth dryness. On Saturday while walking to the bathroom she got lightheaded with worsening headache and seeing spots in front of her eyes and worsening shortness of breath. Whole body aches  Takes atenolol, she thinks 50 mg prescribed by her PA three weeks ago, .  But she didn't get it filled because she didn't want to go up on her dose. She has been off since March.   More stressful because her sister and her 5 kids live with her she also has 4 kids   Takes diazepam for anxiety  Tobacco abuse.  Smokes a pack every 2 weeks  Hospital course: Patient was found to have elevated BP with SBP in the 220s.  Patient was given Nitropaste.  She was also noted to be tachycardic, D-dimer was unremarkable.  EKG confirmed sinus tachycardia with no ischemic changes.  For WBC of 15.1, blood glucose of 164, A1c 7, troponin trend 7-/13.  RVP negative for flu, Covid test negative.  Chest x-ray showed chronic bronchitis with superimposed acute bronchitis. Patient was admitted with working  diagnosis of accelerated hypertension and COPD/asthma exacerbation.  Subjective  Today she no longer has a headache, congestion is improving, no recurrent chest tightness.  Feels breathing is much better  A & P  Accelerated hypertension.  SBP got up to the 200s during my exam this morning while patient was still in the ED.  Suspect due to poor adherence to her home BP regimen of (reports being on atenolol, but off for at least the last few weeks).  Patient has no signs of endorgan damage on laboratory work-up(1 with normal limits, EKG nonischemic, BMP unremarkable, no AKI) -Has already had decrease in 20 mmHg of blood pressure after a few hours in the ED -Transition to oral long-acting regimen with amlodipine 10 mg, lisinopril 20 mg avoid beta-blockers given respiratory illness -Closely monitor may need to titrate up lisinopril pending BP -Monitor STU, dissipate been able to transition to floor 4 hours  Presumed COPD with acute bronchitis.  Presented with cough, worsening shortness of breath, patient active tobacco use.  Not currently requiring O2, breathing significantly improved on scheduled inhalers/Solu-Medrol -Continue DuoNebs every 6 hours, IV Solu-Medrol, transition to oral prednisone on 4/19, azithromycin -Flutter valve, incentive spirometry -Tessalon Perles for cough  Hyperglycemia in the setting of steroid use.  A1c of 7.  Patient has diabetes, no known prior history.  Suspect increase in setting of steroid use. -Sliding scale as needed -Outpatient follow-up  Anxiety/mood disorder.  Patient admits to depressed mood related to both situation with family.  Reports taking diazepam at home. -Prozac started  here -Ativan as needed, to avoid withdrawal symptoms.  Sinus tachycardia improving.  Around 120s currently, as high as 140 in the ED suspect likely combination of rebound related to nonadherence to atenolol, as well as withdrawal from her benzodiazepines. -Ativan as  needed -Continue closely  Leukocytosis, suspect reactive in the setting of steroid use.  Has no signs of infection/pneumonia on chest x-ray.  Otherwise has no other localizing symptoms of infection. -Monitor CBC  morbid obesity.  BMI greater than 50 -Weight loss/lifestyle changes counseling provided  Tobacco use.  Smokes 1 pack every 2 weeks. -Cessation counseling provided     Family Communication  : None  Code Status : Full "  Disposition Plan  :  Patient is from home. Anticipated d/c date: 2 to 3 days. Barriers to d/c or necessity for inpatient status:  IV Solu-Medrol, scheduled inhalers, improvement in blood pressure Consults  : None  Procedures  : None  DVT Prophylaxis  :  Lovenox  Lab Results  Component Value Date   PLT 455 (H) 01/12/2020    Diet :  Diet Order            Diet Carb Modified Fluid consistency: Thin; Room service appropriate? Yes  Diet effective now               Inpatient Medications Scheduled Meds: . aerochamber Z-Stat Plus/medium  1 each Other Once  . albuterol  4 puff Inhalation Once  . [START ON 01/13/2020] azithromycin  500 mg Oral QHS  . bisoprolol  5 mg Oral Daily  . docusate sodium  100 mg Oral BID  . enoxaparin (LOVENOX) injection  60 mg Subcutaneous QHS  . FLUoxetine  10 mg Oral Daily  . ipratropium-albuterol  3 mL Nebulization Q6H  . methylPREDNISolone (SOLU-MEDROL) injection  40 mg Intravenous Q12H   Followed by  . [START ON 01/13/2020] predniSONE  40 mg Oral Q breakfast  . mometasone-formoterol  1 puff Inhalation BID  . nicotine  21 mg Transdermal Daily  . nitroGLYCERIN  1 inch Topical Q6H  . sodium chloride flush  3 mL Intravenous Q12H  . sodium chloride flush  3 mL Intravenous Q12H   Continuous Infusions: . sodium chloride 75 mL/hr at 01/12/20 0537   PRN Meds:.acetaminophen **OR** acetaminophen, hydrALAZINE, HYDROcodone-acetaminophen, levalbuterol, ondansetron **OR** ondansetron (ZOFRAN) IV  Antibiotics  :    Anti-infectives (From admission, onward)   Start     Dose/Rate Route Frequency Ordered Stop   01/13/20 0000  azithromycin (ZITHROMAX) tablet 500 mg     500 mg Oral Daily at bedtime 01/12/20 0022 01/16/20 2159   01/12/20 0030  azithromycin (ZITHROMAX) 500 mg in sodium chloride 0.9 % 250 mL IVPB     500 mg 250 mL/hr over 60 Minutes Intravenous Every 24 hours 01/12/20 0022 01/12/20 0526       Objective   Vitals:   01/12/20 0615 01/12/20 0730 01/12/20 0745 01/12/20 0821  BP: (!) 174/93 (!) 170/102 (!) 157/70   Pulse: 93 (!) 113 (!) 106   Resp: 16 16 19    Temp:      TempSrc:      SpO2: 91% 96% 91% 97%  Weight:      Height:        SpO2: 97 %  Wt Readings from Last 3 Encounters:  01/11/20 128.4 kg  10/24/19 135.2 kg  06/24/19 111.6 kg     Intake/Output Summary (Last 24 hours) at 01/12/2020 0838 Last data filed at 01/12/2020 0526 Gross per 24  hour  Intake 1045.14 ml  Output --  Net 1045.14 ml    Physical Exam:     Awake Alert, Oriented X 3, Normal affect No new F.N deficits, following commands [place].AT, Normal respiratory effort on room air, occasional wheeze/rhonchi at bases no conversational dyspnea Tachycardic, normal rhythm, no Gallops,Rubs or new Murmurs,  +ve B.Sounds, Abd Soft, No tenderness, No rebound, guarding or rigidity. No Cyanosis, No new Rash or bruise     I have personally reviewed the following:   Data Reviewed:  CBC Recent Labs  Lab 01/11/20 1647 01/12/20 0536  WBC 15.1* 18.5*  HGB 13.9 12.3  HCT 44.2 39.1  PLT 473* 455*  MCV 91.3 89.5  MCH 28.7 28.1  MCHC 31.4 31.5  RDW 14.2 14.0  LYMPHSABS  --  1.0  MONOABS  --  0.2  EOSABS  --  0.0  BASOSABS  --  0.0    Chemistries  Recent Labs  Lab 01/11/20 1647 01/12/20 0536  NA 140 136  K 4.7 4.1  CL 103 105  CO2 31 22  GLUCOSE 164* 285*  BUN 7 11  CREATININE 0.86 1.00  CALCIUM 8.9 9.2  MG  --  2.1  AST  --  20  ALT  --  22  ALKPHOS  --  86  BILITOT  --  0.6    ------------------------------------------------------------------------------------------------------------------ No results for input(s): CHOL, HDL, LDLCALC, TRIG, CHOLHDL, LDLDIRECT in the last 72 hours.  No results found for: HGBA1C ------------------------------------------------------------------------------------------------------------------ Recent Labs    01/12/20 0536  TSH 0.613   ------------------------------------------------------------------------------------------------------------------ No results for input(s): VITAMINB12, FOLATE, FERRITIN, TIBC, IRON, RETICCTPCT in the last 72 hours.  Coagulation profile No results for input(s): INR, PROTIME in the last 168 hours.  Recent Labs    01/12/20 0536  DDIMER <0.27    Cardiac Enzymes No results for input(s): CKMB, TROPONINI, MYOGLOBIN in the last 168 hours.  Invalid input(s): CK ------------------------------------------------------------------------------------------------------------------    Component Value Date/Time   BNP 45.5 01/11/2020 1647    Micro Results Recent Results (from the past 240 hour(s))  Respiratory Panel by RT PCR (Flu A&B, Covid) - Nasopharyngeal Swab     Status: None   Collection Time: 01/11/20  9:32 PM   Specimen: Nasopharyngeal Swab  Result Value Ref Range Status   SARS Coronavirus 2 by RT PCR NEGATIVE NEGATIVE Final    Comment: (NOTE) SARS-CoV-2 target nucleic acids are NOT DETECTED. The SARS-CoV-2 RNA is generally detectable in upper respiratoy specimens during the acute phase of infection. The lowest concentration of SARS-CoV-2 viral copies this assay can detect is 131 copies/mL. A negative result does not preclude SARS-Cov-2 infection and should not be used as the sole basis for treatment or other patient management decisions. A negative result may occur with  improper specimen collection/handling, submission of specimen other than nasopharyngeal swab, presence of viral  mutation(s) within the areas targeted by this assay, and inadequate number of viral copies (<131 copies/mL). A negative result must be combined with clinical observations, patient history, and epidemiological information. The expected result is Negative. Fact Sheet for Patients:  PinkCheek.be Fact Sheet for Healthcare Providers:  GravelBags.it This test is not yet ap proved or cleared by the [place] FDA and  has been authorized for detection and/or diagnosis of SARS-CoV-2 by FDA under an Emergency Use Authorization (EUA). This EUA will remain  in effect (meaning this test can be used) for the duration of the COVID-19 declaration under Section 564(b)(1) of the Act, 21 U.S.C. section  360bbb-3(b)(1), unless the authorization is terminated or revoked sooner.    Influenza A by PCR NEGATIVE NEGATIVE Final   Influenza B by PCR NEGATIVE NEGATIVE Final    Comment: (NOTE) The Xpert Xpress SARS-CoV-2/FLU/RSV assay is intended as an aid in  the diagnosis of influenza from Nasopharyngeal swab specimens and  should not be used as a sole basis for treatment. Nasal washings and  aspirates are unacceptable for Xpert Xpress SARS-CoV-2/FLU/RSV  testing. Fact Sheet for Patients: PinkCheek.be Fact Sheet for Healthcare Providers: GravelBags.it This test is not yet approved or cleared by the [place] FDA and  has been authorized for detection and/or diagnosis of SARS-CoV-2 by  FDA under an Emergency Use Authorization (EUA). This EUA will remain  in effect (meaning this test can be used) for the duration of the  Covid-19 declaration under Section 564(b)(1) of the Act, 21  U.S.C. section 360bbb-3(b)(1), unless the authorization is  terminated or revoked. Performed at [facility], [place] [address] [place], [place] [address]     Radiology Reports DG Chest 2  View  Result Date: 01/11/2020 CLINICAL DATA:  Cough, shortness of breath and myalgias. Smoker. EXAM: CHEST - 2 VIEW COMPARISON:  11/18/2016 FINDINGS: Normal sized heart. Clear lungs with normal vascularity. Mild peribronchial thickening with scratch the interval mild peribronchial thickening and interval flattening of the hemidiaphragms on the lateral view. Mild thoracic spine degenerative changes. IMPRESSION: Interval mild changes of COPD and chronic bronchitis with possible superimposed acute bronchitis. Electronically Signed   By: Claudie Revering M.D.   On: 01/11/2020 16:48     Time Spent in minutes  30     Desiree Hane M.D on 01/12/2020 at 8:38 AM  To page go to [email or web address] - password [facility]

## 2020-01-12 NOTE — Progress Notes (Signed)
PT demonstrated hands on understanding of Flutter device. 

## 2020-01-12 NOTE — Progress Notes (Signed)
Rx Brief note: Lovenox  Wt = 128 kg, CrCl>100 ml/min, BMI = 51  Rx adjusted Lovenox to 60 mg daily in pt with BMI>30   Thanks Dorrene German 01/12/2020 12:32 AM

## 2020-01-12 NOTE — ED Notes (Signed)
Meal tray provided.

## 2020-01-12 NOTE — ED Notes (Signed)
Attempted to call report, ICU nurse stated that she paged the doctor and the doctor will reassess her on rounds to determine if she is ICU appropriate.

## 2020-01-13 ENCOUNTER — Inpatient Hospital Stay (HOSPITAL_COMMUNITY): Payer: Medicaid Other

## 2020-01-13 DIAGNOSIS — I10 Essential (primary) hypertension: Secondary | ICD-10-CM

## 2020-01-13 DIAGNOSIS — E1159 Type 2 diabetes mellitus with other circulatory complications: Secondary | ICD-10-CM

## 2020-01-13 DIAGNOSIS — R0602 Shortness of breath: Secondary | ICD-10-CM

## 2020-01-13 LAB — CBC
HCT: 36.8 % (ref 36.0–46.0)
Hemoglobin: 11.4 g/dL — ABNORMAL LOW (ref 12.0–15.0)
MCH: 27.9 pg (ref 26.0–34.0)
MCHC: 31 g/dL (ref 30.0–36.0)
MCV: 90.2 fL (ref 80.0–100.0)
Platelets: 443 10*3/uL — ABNORMAL HIGH (ref 150–400)
RBC: 4.08 MIL/uL (ref 3.87–5.11)
RDW: 14.4 % (ref 11.5–15.5)
WBC: 31.1 10*3/uL — ABNORMAL HIGH (ref 4.0–10.5)
nRBC: 0 % (ref 0.0–0.2)

## 2020-01-13 LAB — BASIC METABOLIC PANEL
Anion gap: 10 (ref 5–15)
BUN: 13 mg/dL (ref 6–20)
CO2: 24 mmol/L (ref 22–32)
Calcium: 9 mg/dL (ref 8.9–10.3)
Chloride: 106 mmol/L (ref 98–111)
Creatinine, Ser: 0.75 mg/dL (ref 0.44–1.00)
GFR calc Af Amer: >60 mL/min (ref >60)
GFR calc non Af Amer: >60 mL/min (ref >60)
Glucose, Bld: 109 mg/dL — ABNORMAL HIGH (ref 70–99)
Potassium: 3.8 mmol/L (ref 3.5–5.1)
Sodium: 140 mmol/L (ref 135–145)

## 2020-01-13 LAB — MRSA PCR SCREENING: MRSA by PCR: NEGATIVE

## 2020-01-13 LAB — GLUCOSE, CAPILLARY
Glucose-Capillary: 109 mg/dL — ABNORMAL HIGH (ref 70–99)
Glucose-Capillary: 208 mg/dL — ABNORMAL HIGH (ref 70–99)
Glucose-Capillary: 256 mg/dL — ABNORMAL HIGH (ref 70–99)
Glucose-Capillary: 221 mg/dL — ABNORMAL HIGH (ref 70–99)

## 2020-01-13 MED ORDER — IPRATROPIUM-ALBUTEROL 0.5-2.5 (3) MG/3ML IN SOLN
3.0000 mL | Freq: Three times a day (TID) | RESPIRATORY_TRACT | Status: DC
  Administered 2020-01-13 – 2020-01-14 (×4): 3 mL via RESPIRATORY_TRACT
  Filled 2020-01-13 (×4): qty 3

## 2020-01-13 NOTE — Progress Notes (Signed)
[facility]  PROGRESS NOTE  Katherine Douglas [id number] DOB: 04/22/1981 DOA: 01/11/2020 PCP: Trey Sailors, PA Admit date - 01/11/2020   Admitting Physician Toy Baker, MD  Outpatient Primary MD for the patient is Trey Sailors, PA  LOS - 2 Brief Narrative   Katherine Douglas is a 39 y.o. year old female with medical history significant for HTN nonadherence to atenolol, tobacco use, anxiety who presented on 01/11/2020 with complaints of worsening headache, congestion, generalized body aches, shortness of breath with exertion.  She states she noticed her symptoms started on Friday with headache and congestion. Felt like a normal sinus cold. She took a benadryl with minimal improvement. She knew it was getting worse because of chest tightness followed by coughing.  Tried her albuterol inhaler which made it worse. The next day tried lemon/honey tea and lozenges and stay hydrated with water. She had mouth dryness. On Saturday while walking to the bathroom she got lightheaded with worsening headache and seeing spots in front of her eyes and worsening shortness of breath. Whole body aches  Takes atenolol, she thinks 50 mg prescribed by her PA three weeks ago, .  But she didn't get it filled because she didn't want to go up on her dose. She has been off since March.   More stressful because her sister and her 5 kids live with her she also has 4 kids   Takes diazepam for anxiety  Tobacco abuse.  Smokes a pack every 2 weeks  Hospital course: Patient was found to have elevated BP with SBP in the 220s.  Patient was given Nitropaste.  She was also noted to be tachycardic, D-dimer was unremarkable.  EKG confirmed sinus tachycardia with no ischemic changes.  For WBC of 15.1, blood glucose of 164, A1c 7, troponin trend 7-/13.  RVP negative for flu, Covid test negative.  Chest x-ray showed chronic bronchitis with superimposed acute bronchitis. Patient was admitted with working  diagnosis of accelerated hypertension and COPD/asthma exacerbation.  Patient had no signs of endorgan damage on laboratory work-up (troponin trend within normal limits, EKG nonischemic, BNP unremarkable, no AKI).  Patient did have some shortness of breath particular with exertion and attributed to COPD with acute bronchitis based off chest x-ray imaging on admission and started on IV Solu-Medrol and scheduled DuoNebs  Subjective  Today she no longer has a headache, congestion is improving, no recurrent chest tightness.  Feels breathing is much better  A & P  Accelerated hypertension, improving.  SBP much improved currently 155/78.  Responding well to blood pressure regimen.  Previous peak of 220/1 20s.  In the setting of poor adherence to previous BP regimen.   -amlodipine 10 mg, lisinopril 20 mg --avoid beta-blockers given respiratory illness -Closely monitor may need to titrate up lisinopril pending BP -BP has improved significantly, transfer to the floor  Presumed COPD with acute bronchitis.  Presented with cough, worsening shortness of breath, patient active tobacco use.  Not currently requiring O2 but gets noticeably short of breath still with minimal exertion, still has productive cough, and hypoxic event with exertion. -Repeat chest x-ray to ensure no interval changes -Continue DuoNebs every 6 hours, transition to oral prednisone on 4/19, azithromycin -Flutter valve, incentive spirometry -Tessalon Perles for cough  Diabetes, no previous diagnosis, with hyperglycemia worse in the setting of steroids.  A1c of 7. -Nutrition provided counseling -Outpatient referral to nutrition/diabetes management placed -Patient need close follow-up with PCP, may need to start Metformin as outpatient line -  continue sliding scale as needed, monitor CBGs   Anxiety/mood disorder.  Patient admits to depressed mood related to living situation with family.  Reports taking diazepam at home. -Prozac started  here -Ativan as needed, to avoid withdrawal symptoms.  Sinus tachycardia improving.  Around 120s currently, as high as 140 in the ED suspect likely combination of rebound related to nonadherence to atenolol, as well as withdrawal from her benzodiazepines.  D-dimer was unremarkable, doubt PE, TSH within normal limits. -Obtain TTE given persistent tachycardia -Ativan as needed -Continue closely  Leukocytosis, suspect reactive in the setting of steroid use.  Significant increase from 18.5 to 31has no signs of infection/pneumonia on chest x-ray.  Otherwise has no other localizing symptoms of infection and remains afebrile -Monitor CBC -Repeat chest x-ray-blood cultures becomes febrile  morbid obesity.  BMI greater than 50 -Weight loss/lifestyle changes counseling provided  Tobacco use.  Smokes 1 pack every 2 weeks. -Cessation counseling provided     Family Communication  : None  Code Status : Full "  Disposition Plan  :  Patient is from home. Anticipated d/c date: 2 to 3 days. Barriers to d/c or necessity for inpatient status:  Evaluation of sinus tachycardia, TTE, repeat chest x-ray, scheduled inhalers, improvement in blood pressure Consults  : None  Procedures  : None  DVT Prophylaxis  :  Lovenox  Lab Results  Component Value Date   PLT 443 (H) 01/13/2020    Diet :  Diet Order            Diet Carb Modified Fluid consistency: Thin; Room service appropriate? Yes  Diet effective now               Inpatient Medications Scheduled Meds: . albuterol  4 puff Inhalation Once  . amLODipine  10 mg Oral Daily  . azithromycin  500 mg Oral QHS  . Chlorhexidine Gluconate Cloth  6 each Topical Daily  . docusate sodium  100 mg Oral BID  . enoxaparin (LOVENOX) injection  60 mg Subcutaneous QHS  . FLUoxetine  10 mg Oral Daily  . insulin aspart  0-6 Units Subcutaneous TID WC  . ipratropium-albuterol  3 mL Nebulization TID  . lisinopril  20 mg Oral Daily  . mometasone-formoterol   1 puff Inhalation BID  . nicotine  21 mg Transdermal Daily  . nitroGLYCERIN  1 inch Topical Q6H  . predniSONE  40 mg Oral Q breakfast  . sodium chloride flush  3 mL Intravenous Q12H  . sodium chloride flush  3 mL Intravenous Q12H   Continuous Infusions:  PRN Meds:.acetaminophen **OR** acetaminophen, benzonatate, hydrALAZINE, HYDROcodone-acetaminophen, levalbuterol, LORazepam, ondansetron **OR** ondansetron (ZOFRAN) IV  Antibiotics  :   Anti-infectives (From admission, onward)   Start     Dose/Rate Route Frequency Ordered Stop   01/13/20 0000  azithromycin (ZITHROMAX) tablet 500 mg     500 mg Oral Daily at bedtime 01/12/20 0022 01/16/20 2159   01/12/20 0030  azithromycin (ZITHROMAX) 500 mg in sodium chloride 0.9 % 250 mL IVPB     500 mg 250 mL/hr over 60 Minutes Intravenous Every 24 hours 01/12/20 0022 01/12/20 0526       Objective   Vitals:   01/13/20 0800 01/13/20 0802 01/13/20 1000 01/13/20 1200  BP:   (!) 155/78   Pulse:   99   Resp:   16   Temp: 98.8 F (37.1 C)   98.2 F (36.8 C)  TempSrc: Oral   Oral  SpO2:  96% 92%  Weight:      Height:        SpO2: 92 %  Wt Readings from Last 3 Encounters:  01/12/20 133.5 kg  10/24/19 135.2 kg  06/24/19 111.6 kg     Intake/Output Summary (Last 24 hours) at 01/13/2020 1332 Last data filed at 01/13/2020 0700 Gross per 24 hour  Intake 243 ml  Output 550 ml  Net -307 ml    Physical Exam:     Awake Alert, Oriented X 3, Normal affect No new F.N deficits, following commands [place].AT, Normal respiratory effort on room air, rhonchi/wheezing clear lower bases but improving prior exam, no conversational dyspnea Tachycardic, normal rhythm, no Gallops,Rubs or new Murmurs,  +ve B.Sounds, Abd Soft, No tenderness, No rebound, guarding or rigidity. No Cyanosis, No new Rash or bruise     I have personally reviewed the following:   Data Reviewed:  CBC Recent Labs  Lab 01/11/20 1647 01/12/20 0536 01/13/20 0325  WBC  15.1* 18.5* 31.1*  HGB 13.9 12.3 11.4*  HCT 44.2 39.1 36.8  PLT 473* 455* 443*  MCV 91.3 89.5 90.2  MCH 28.7 28.1 27.9  MCHC 31.4 31.5 31.0  RDW 14.2 14.0 14.4  LYMPHSABS  --  1.0  --   MONOABS  --  0.2  --   EOSABS  --  0.0  --   BASOSABS  --  0.0  --     Chemistries  Recent Labs  Lab 01/11/20 1647 01/12/20 0536 01/13/20 0325  NA 140 136 140  K 4.7 4.1 3.8  CL 103 105 106  CO2 31 22 24   GLUCOSE 164* 285* 109*  BUN 7 11 13   CREATININE 0.86 1.00 0.75  CALCIUM 8.9 9.2 9.0  MG  --  2.1  --   AST  --  20  --   ALT  --  22  --   ALKPHOS  --  86  --   BILITOT  --  0.6  --    ------------------------------------------------------------------------------------------------------------------ No results for input(s): CHOL, HDL, LDLCALC, TRIG, CHOLHDL, LDLDIRECT in the last 72 hours.  Lab Results  Component Value Date   HGBA1C 7.1 (H) 01/12/2020   ------------------------------------------------------------------------------------------------------------------ Recent Labs    01/12/20 0536  TSH 0.613   ------------------------------------------------------------------------------------------------------------------ No results for input(s): VITAMINB12, FOLATE, FERRITIN, TIBC, IRON, RETICCTPCT in the last 72 hours.  Coagulation profile No results for input(s): INR, PROTIME in the last 168 hours.  Recent Labs    01/12/20 0536  DDIMER <0.27    Cardiac Enzymes No results for input(s): CKMB, TROPONINI, MYOGLOBIN in the last 168 hours.  Invalid input(s): CK ------------------------------------------------------------------------------------------------------------------    Component Value Date/Time   BNP 45.5 01/11/2020 1647    Micro Results Recent Results (from the past 240 hour(s))  Respiratory Panel by RT PCR (Flu A&B, Covid) - Nasopharyngeal Swab     Status: None   Collection Time: 01/11/20  9:32 PM   Specimen: Nasopharyngeal Swab  Result Value Ref Range  Status   SARS Coronavirus 2 by RT PCR NEGATIVE NEGATIVE Final    Comment: (NOTE) SARS-CoV-2 target nucleic acids are NOT DETECTED. The SARS-CoV-2 RNA is generally detectable in upper respiratoy specimens during the acute phase of infection. The lowest concentration of SARS-CoV-2 viral copies this assay can detect is 131 copies/mL. A negative result does not preclude SARS-Cov-2 infection and should not be used as the sole basis for treatment or other patient management decisions. A negative result may occur with  improper specimen collection/handling, submission of  specimen other than nasopharyngeal swab, presence of viral mutation(s) within the areas targeted by this assay, and inadequate number of viral copies (<131 copies/mL). A negative result must be combined with clinical observations, patient history, and epidemiological information. The expected result is Negative. Fact Sheet for Patients:  PinkCheek.be Fact Sheet for Healthcare Providers:  GravelBags.it This test is not yet ap proved or cleared by the [place] FDA and  has been authorized for detection and/or diagnosis of SARS-CoV-2 by FDA under an Emergency Use Authorization (EUA). This EUA will remain  in effect (meaning this test can be used) for the duration of the COVID-19 declaration under Section 564(b)(1) of the Act, 21 U.S.C. section 360bbb-3(b)(1), unless the authorization is terminated or revoked sooner.    Influenza A by PCR NEGATIVE NEGATIVE Final   Influenza B by PCR NEGATIVE NEGATIVE Final    Comment: (NOTE) The Xpert Xpress SARS-CoV-2/FLU/RSV assay is intended as an aid in  the diagnosis of influenza from Nasopharyngeal swab specimens and  should not be used as a sole basis for treatment. Nasal washings and  aspirates are unacceptable for Xpert Xpress SARS-CoV-2/FLU/RSV  testing. Fact Sheet for  Patients: PinkCheek.be Fact Sheet for Healthcare Providers: GravelBags.it This test is not yet approved or cleared by the [place] FDA and  has been authorized for detection and/or diagnosis of SARS-CoV-2 by  FDA under an Emergency Use Authorization (EUA). This EUA will remain  in effect (meaning this test can be used) for the duration of the  Covid-19 declaration under Section 564(b)(1) of the Act, 21  U.S.C. section 360bbb-3(b)(1), unless the authorization is  terminated or revoked. Performed at [facility], [place] [address] [place], [place] [address]   Culture, sputum-assessment     Status: None   Collection Time: 01/12/20 12:23 AM   Specimen: Tracheal Aspirate; Sputum  Result Value Ref Range Status   Specimen Description TRACHEAL ASPIRATE  Final   Special Requests NONE  Final   Sputum evaluation   Final    THIS SPECIMEN IS ACCEPTABLE FOR SPUTUM CULTURE Performed at [facility], [place] [address], [place], [place] [address]    Report Status 01/12/2020 FINAL  Final  Culture, respiratory     Status: None (Preliminary result)   Collection Time: 01/12/20 12:23 AM   Specimen: Tracheal Aspirate  Result Value Ref Range Status   Specimen Description   Final    TRACHEAL ASPIRATE Performed at [place] [address] [place], [place] [address]    Special Requests   Final    NONE Reflexed from [id number] Performed at [facility], [place] [address] [place], [place] [address]    Gram Stain   Final    FEW WBC PRESENT, PREDOMINANTLY PMN NO ORGANISMS SEEN    Culture   Final    NO GROWTH < 24 HOURS Performed at [place] [address], [place], [place] [address]    Report Status PENDING  Incomplete  MRSA PCR Screening     Status: None   Collection Time: 01/12/20  9:30 PM   Specimen: Nasopharyngeal  Result Value Ref Range Status   MRSA  by PCR NEGATIVE NEGATIVE Final    Comment:        The GeneXpert MRSA Assay (FDA approved for NASAL specimens only), is one component of a comprehensive MRSA colonization surveillance program. It is not intended to diagnose MRSA infection nor to guide or monitor treatment for MRSA infections. Performed  at [facility], [place] [address], [place], [place] [address]     Radiology Reports DG Chest 2 View  Result Date: 01/11/2020 CLINICAL DATA:  Cough, shortness of breath and myalgias. Smoker. EXAM: CHEST - 2 VIEW COMPARISON:  11/18/2016 FINDINGS: Normal sized heart. Clear lungs with normal vascularity. Mild peribronchial thickening with scratch the interval mild peribronchial thickening and interval flattening of the hemidiaphragms on the lateral view. Mild thoracic spine degenerative changes. IMPRESSION: Interval mild changes of COPD and chronic bronchitis with possible superimposed acute bronchitis. Electronically Signed   By: Claudie Revering M.D.   On: 01/11/2020 16:48   DG CHEST PORT 1 VIEW  Result Date: 01/13/2020 CLINICAL DATA:  Worsening shortness of breath and hypoxia. EXAM: PORTABLE CHEST 1 VIEW COMPARISON:  01/11/2020 FINDINGS: 0956 hours. The lungs are clear without focal pneumonia, edema, pneumothorax or pleural effusion. The cardiopericardial silhouette is within normal limits for size. The visualized bony structures of the thorax are intact. Telemetry leads overlie the chest. IMPRESSION: No active disease. Electronically Signed   By: Misty Stanley M.D.   On: 01/13/2020 10:08     Time Spent in minutes  30     Desiree Hane M.D on 01/13/2020 at 1:32 PM  To page go to [email or web address] - password [facility]

## 2020-01-13 NOTE — Evaluation (Signed)
Physical Therapy Evaluation Patient Details Name: Katherine Douglas MRN: [id number] DOB: Aug 10, 1981 Today's Date: 01/13/2020   History of Present Illness  Katherine Douglas is a 39 y.o. year old female with medical history significant for HTN nonadherence to atenolol, tobacco use, anxiety who presented on 01/11/2020 with complaints of worsening headache, congestion, generalized body aches, shortness of breath with exertion.Patient was found to have elevated BP with SBP in the 220s and  also noted to be tachycardic  Clinical Impression  The  Patient moves well. HR range 112 at rest to 140 for ambulation to BR and BM. BP post activity 179/72. SPO2 95%  Or > throughout.Patient endorses many stresses in her life. Provided information  For relaxation and grounding techniques and reviewed a few.  Patient should progress to DC back home. Pt admitted with above diagnosis.   Pt currently with functional limitations due to the deficits listed below (see PT Problem List). Pt will benefit from skilled PT to increase their independence and safety with mobility to allow discharge to the venue listed below.       Follow Up Recommendations No PT follow up    Equipment Recommendations  None recommended by PT    Recommendations for Other Services       Precautions / Restrictions Precautions Precaution Comments: monitor  HR and BP, both very high      Mobility  Bed Mobility Overal bed mobility: Independent                Transfers Overall transfer level: Independent Equipment used: None                Ambulation/Gait Ambulation/Gait assistance: Supervision Gait Distance (Feet): 20 Feet(then 30') Assistive device: None Gait Pattern/deviations: Step-through pattern Gait velocity: decr   General Gait Details: slow deliberate speed, patient attempting to maintain HR from increasing  By taking mobility at a fr slow pace.  Stairs            Wheelchair Mobility    Modified  Rankin (Stroke Patients Only)       Balance                                             Pertinent Vitals/Pain Pain Assessment: Faces Faces Pain Scale: Hurts little more Pain Location: HA, back Pain Descriptors / Indicators: Discomfort Pain Intervention(s): Monitored during session    Home Living Family/patient expects to be discharged to:: Private residence Living Arrangements: Children;Other relatives Available Help at Discharge: Family Type of Home: House Home Access: Stairs to enter     Home Layout: One level Home Equipment: None      Prior Function Level of Independence: Independent               Hand Dominance   Dominant Hand: Right    Extremity/Trunk Assessment        Lower Extremity Assessment Lower Extremity Assessment: Overall WFL for tasks assessed    Cervical / Trunk Assessment Cervical / Trunk Assessment: Normal  Communication   Communication: No difficulties  Cognition Arousal/Alertness: Awake/alert Behavior During Therapy: WFL for tasks assessed/performed Overall Cognitive Status: Within Functional Limits for tasks assessed  General Comments      Exercises     Assessment/Plan    PT Assessment Patient needs continued PT services  PT Problem List Decreased mobility;Cardiopulmonary status limiting activity;Decreased safety awareness;Decreased activity tolerance       PT Treatment Interventions Gait training;Functional mobility training;Therapeutic activities;Patient/family education    PT Goals (Current goals can be found in the Care Plan section)  Acute Rehab PT Goals Patient Stated Goal: to not be so stressed, return home PT Goal Formulation: With patient Time For Goal Achievement: 01/27/20 Potential to Achieve Goals: Good    Frequency Min 3X/week   Barriers to discharge        Co-evaluation               AM-PAC PT "6 Clicks" Mobility   Outcome Measure Help needed turning from your back to your side while in a flat bed without using bedrails?: None Help needed moving from lying on your back to sitting on the side of a flat bed without using bedrails?: None Help needed moving to and from a bed to a chair (including a wheelchair)?: None Help needed standing up from a chair using your arms (e.g., wheelchair or bedside chair)?: None Help needed to walk in hospital room?: A Little Help needed climbing 3-5 steps with a railing? : A Little 6 Click Score: 22    End of Session   Activity Tolerance: Treatment limited secondary to medical complications (Comment)(increased HR and BP) Patient left: in chair;with call bell/phone within reach;with chair alarm set Nurse Communication: Mobility status PT Visit Diagnosis: Unsteadiness on feet (R26.81);Difficulty in walking, not elsewhere classified (R26.2)    Time: 1200-1230 PT Time Calculation (min) (ACUTE ONLY): 30 min   Charges:   PT Evaluation $PT Eval Low Complexity: 1 Low PT Treatments $Gait Training: 8-22 mins        Tresa Endo PT Acute Rehabilitation Services Pager [phone] Office [phone]   Claretha Cooper 01/13/2020, 12:47 PM

## 2020-01-13 NOTE — Progress Notes (Signed)
Initial Nutrition Assessment  DOCUMENTATION CODES:   Morbid obesity  INTERVENTION:  - diet education.  - provided active, empathetic listening. - messaged MD requesting referral to Nutrition and Diabetes Education Services and to CSW here for mental health resources.    NUTRITION DIAGNOSIS:   Altered nutrition lab value related to other (see comment)(elevated CBG with no hx of DM) as evidenced by other (comment).  GOAL:   Other (Comment)(hyperglycemia, need for insulin regimen)  MONITOR:   PO intake, Labs, Weight trends  REASON FOR ASSESSMENT:   Consult Malnutrition Eval  ASSESSMENT:   39 y.o. year old female with medical history of HTN with non-adherence to atenolol, tobacco use (smokes a pack every 2 weeks), and anxiety. She presented to the ED on 4/17 with complaints of worsening headache, congestion, generalized body aches, and SOB with exertion. She has 4 kids and her sister and her sister's 5 kids recently moved in with her.  No intakes documented since admission. Patient was waiting for lunch during RD visit. Patient is very knowledgeable about limiting salt intake in foods, making healthier food choices, and preparing foods in healthier ways (for example, she tries to limit or avoid fried foods, and choices baked items instead).   She discloses that she has been struggling with severe depression and anxiety. 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 Patient indicates that she does not take time for herself due to being so busy being there for others. She also lost her job in the midst of the pandemic.   She does use food as a coping mechanism and sense of comfort. She states that about a year ago she began losing weight after a 115 lb weight gain that caused  her to reach a high weight of 330 lb. Patient is tearful in expressing that she knows that she needs to make changes so then her children can also be as healthy as possible. She has a son who is ~300 lb and gained ~100 lb at the time that patient gained 115 lb. Patient states knowing that she is the one who does the grocery shopping and that making better food choices for everyone in the home would be beneficial.  Patient is very interested in ongoing nutrition-related conversations and help with making ongoing food-related changes. She is also interested in mental health services and low-cost gym options.   Per chart review, weight yesterday was 294 lb, weight on 06/24/19 was 245 lb, and weight on 08/07/18 was 296 lb.    Labs reviewed; CBGs: 109 and 208 mg/dl. Medications reviewed; 100 mg colace BID, sliding scale novolog, 4 units novolog x1 dose 4/18, 40 mg solu-medrol x2 doses 4/18, 40 mg deltasone/day.     NUTRITION - FOCUSED PHYSICAL EXAM:  completed; no muscle or fat wasting, mild generalized edema.   Diet Order:   Diet Order            Diet Carb Modified Fluid consistency: Thin; Room service appropriate? Yes  Diet effective now              EDUCATION NEEDS:   Education needs have been addressed  Skin:  Skin Assessment: Reviewed RN Assessment  Last BM:  4/18  Height:   Ht  Readings from Last 1 Encounters:  01/12/20 5\' 2"  (1.575 m)    Weight:   Wt Readings from Last 1 Encounters:  01/12/20 133.5 kg    Ideal Body Weight:  50 kg  BMI:  Body mass index is 53.83 kg/m.  Estimated Nutritional Needs:   Kcal:  2005-2270 kcal  Protein:  100-115 grams  Fluid:  >/= 2 L/day     Jarome Matin, MS, RD, LDN, CNSC Inpatient Clinical Dietitian RD pager # available in [place]  After hours/weekend pager # available in [facility]

## 2020-01-13 NOTE — Progress Notes (Signed)
Occupational Therapy Evaluation Patient Details Name: Katherine Douglas MRN: [id number] DOB: [birth year] Today's Date: 01/13/2020    History of Present Illness Katherine Douglas is a 39 y.o. year old female with medical history significant for HTN nonadherence to atenolol, tobacco use, anxiety who presented on 01/11/2020 with complaints of worsening headache, congestion, generalized body aches, shortness of breath with exertion.Patient was found to have elevated BP with SBP in the 220s and  also noted to be tachycardic   Clinical Impression   PTA,  Pt lived independently with her 3 kids and her sister and her 4 kids. Pt completing ADL and mobility @ independent level however HR increases to 140s with short distant mobility with increased complaints of SOB. Pt talked about her issues with anxiety and how it is "out of control" and became tearful. Listened to pt and began educating pt on strategies/coping skills regarding anxiety. Pt interested in counseling as an outpt. Will follow acutely.     Follow Up Recommendations  No OT follow up;Supervision - Intermittent    Equipment Recommendations  None recommended by OT    Recommendations for Other Services Other (comment)(counseling for anxiety)     Precautions / Restrictions Precautions Precautions: Other (comment) Precaution Comments: monitor  HR and BP, both very high      Mobility Bed Mobility Overal bed mobility: Independent             General bed mobility comments: OOB in chair  Transfers Overall transfer level: Independent Equipment used: None                  Balance Overall balance assessment: No apparent balance deficits (not formally assessed)                                         ADL either performed or assessed with clinical judgement   ADL Overall ADL's : Needs assistance/impaired                                     Functional mobility during ADLs:  Independent General ADL Comments: Pt overall close to baseline regarding her ability to complete her ADL tasks. Becomes SOB and HR increases into 140s     Vision         Perception     Praxis      Pertinent Vitals/Pain Pain Assessment: No/denies pain Faces Pain Scale: Hurts little more Pain Location: HA, back Pain Descriptors / Indicators: Discomfort Pain Intervention(s): Monitored during session     Hand Dominance Right   Extremity/Trunk Assessment Upper Extremity Assessment Upper Extremity Assessment: Overall WFL for tasks assessed   Lower Extremity Assessment Lower Extremity Assessment: Defer to PT evaluation   Cervical / Trunk Assessment Cervical / Trunk Assessment: Normal   Communication Communication Communication: No difficulties   Cognition Arousal/Alertness: Awake/alert Behavior During Therapy: Anxious;WFL for tasks assessed/performed Overall Cognitive Status: Within Functional Limits for tasks assessed                                 General Comments: Began crying when talking about her anxiety "I just can't turn it off", referring to her thoughts   General Comments  Began educating pt on anxiety management using "challengin irrational thought" activity ,  imagery adn progressive muscle relaxation.    Exercises     Shoulder Instructions      Home Living Family/patient expects to be discharged to:: Private residence Living Arrangements: Children;Other relatives Available Help at Discharge: Family Type of Home: House Home Access: Stairs to enter     Home Layout: One level     Bathroom Shower/Tub: Teacher, early years/pre: Standard Bathroom Accessibility: Yes How Accessible: Accessible via walker Home Equipment: None          Prior Functioning/Environment Level of Independence: Independent                 OT Problem List: Decreased activity tolerance;Cardiopulmonary status limiting activity;Obesity;Other  (comment)(decreased coping strategies)      OT Treatment/Interventions: Self-care/ADL training;Energy conservation;DME and/or AE instruction;Therapeutic activities;Patient/family education    OT Goals(Current goals can be found in the care plan section) Acute Rehab OT Goals Patient Stated Goal: to deal with anxiety OT Goal Formulation: With patient Time For Goal Achievement: 01/27/20 Potential to Achieve Goals: Good  OT Frequency: Min 2X/week   Barriers to D/C:            Co-evaluation              AM-PAC OT "6 Clicks" Daily Activity     Outcome Measure Help from another person eating meals?: None Help from another person taking care of personal grooming?: None Help from another person toileting, which includes using toliet, bedpan, or urinal?: None Help from another person bathing (including washing, rinsing, drying)?: None Help from another person to put on and taking off regular upper body clothing?: None Help from another person to put on and taking off regular lower body clothing?: None 6 Click Score: 24   End of Session Nurse Communication: Other (comment)(need for outpt counseling to manage anxiety)  Activity Tolerance: Patient tolerated treatment well Patient left: in chair;with call bell/phone within reach  OT Visit Diagnosis: Other (comment)(anxiety)                Time: 1510-1535 OT Time Calculation (min): 25 min Charges:  OT General Charges $OT Visit: 1 Visit OT Evaluation $OT Eval Moderate Complexity: 1 Mod OT Treatments $Therapeutic Activity: 8-22 mins  Maurie Boettcher, OT/L   Acute OT Clinical Specialist Acute Rehabilitation Services Pager [phone] Office [phone]   [facility] 01/13/2020, 3:43 PM

## 2020-01-13 NOTE — Plan of Care (Signed)
  Problem: Activity: Goal: Risk for activity intolerance will decrease Outcome: Progressing   Problem: Nutrition: Goal: Adequate nutrition will be maintained Outcome: Progressing   

## 2020-01-13 NOTE — Progress Notes (Signed)
Patient arrived from ICU a/o/v, denies any any pain. MD aware. Will continue to monitor.

## 2020-01-13 NOTE — Progress Notes (Addendum)
   01/13/20 1640  Assess: MEWS Score  Temp 98.8 F (37.1 C)  BP (!) 149/92  Pulse Rate (!) 136  Resp 18  SpO2 100 %  O2 Device Room Air  Assess: MEWS Score  MEWS Temp 0  MEWS Systolic 0  MEWS Pulse 3  MEWS RR 0  MEWS LOC 0  MEWS Score 3  MEWS Score Color Yellow  Assess: if the MEWS score is Yellow or Red  Were vital signs taken at a resting state? Yes  Focused Assessment Documented focused assessment  Early Detection of Sepsis Score *See Row Information* Medium  MEWS guidelines implemented *See Row Information* Yes  Take Vital Signs  Increase Vital Sign Frequency  Yellow: Q 2hr X 2 then Q 4hr X 2, if remains yellow, continue Q 4hrs  Escalate  MEWS: Escalate Yellow: discuss with charge nurse/RN and consider discussing with provider and RRT  Notify: Charge Nurse/RN  Name of Charge Nurse/RN Notified Jarrett Soho, RN  Date Charge Nurse/RN Notified 01/13/20  Time Charge Nurse/RN Notified 1650   Patient in yellows MEWS d/t asymptomatic tachycardia, protocol initiated. Discuss with CN, no rapid called at this time. Will continue to monitor patient.

## 2020-01-14 ENCOUNTER — Inpatient Hospital Stay (HOSPITAL_COMMUNITY): Payer: Medicaid Other

## 2020-01-14 LAB — BASIC METABOLIC PANEL
Anion gap: 8 (ref 5–15)
BUN: 14 mg/dL (ref 6–20)
CO2: 24 mmol/L (ref 22–32)
Calcium: 8.3 mg/dL — ABNORMAL LOW (ref 8.9–10.3)
Chloride: 106 mmol/L (ref 98–111)
Creatinine, Ser: 0.65 mg/dL (ref 0.44–1.00)
GFR calc Af Amer: >60 mL/min (ref >60)
GFR calc non Af Amer: >60 mL/min (ref >60)
Glucose, Bld: 106 mg/dL — ABNORMAL HIGH (ref 70–99)
Potassium: 3.8 mmol/L (ref 3.5–5.1)
Sodium: 138 mmol/L (ref 135–145)

## 2020-01-14 LAB — GLUCOSE, CAPILLARY
Glucose-Capillary: 90 mg/dL (ref 70–99)
Glucose-Capillary: 192 mg/dL — ABNORMAL HIGH (ref 70–99)
Glucose-Capillary: 162 mg/dL — ABNORMAL HIGH (ref 70–99)
Glucose-Capillary: 177 mg/dL — ABNORMAL HIGH (ref 70–99)

## 2020-01-14 LAB — CBC
HCT: 36.1 % (ref 36.0–46.0)
Hemoglobin: 11.3 g/dL — ABNORMAL LOW (ref 12.0–15.0)
MCH: 28.5 pg (ref 26.0–34.0)
MCHC: 31.3 g/dL (ref 30.0–36.0)
MCV: 91.2 fL (ref 80.0–100.0)
Platelets: 456 10*3/uL — ABNORMAL HIGH (ref 150–400)
RBC: 3.96 MIL/uL (ref 3.87–5.11)
RDW: 14.4 % (ref 11.5–15.5)
WBC: 21.4 10*3/uL — ABNORMAL HIGH (ref 4.0–10.5)
nRBC: 0 % (ref 0.0–0.2)

## 2020-01-14 LAB — CULTURE, RESPIRATORY W GRAM STAIN: Culture: NORMAL

## 2020-01-14 MED ORDER — LEVALBUTEROL HCL 1.25 MG/0.5ML IN NEBU: 1.2500 mg | INHALATION_SOLUTION | Freq: Four times a day (QID) | RESPIRATORY_TRACT | Status: DC | PRN

## 2020-01-14 MED ORDER — IPRATROPIUM-ALBUTEROL 0.5-2.5 (3) MG/3ML IN SOLN: 3.0000 mL | Freq: Two times a day (BID) | RESPIRATORY_TRACT | Status: DC

## 2020-01-14 NOTE — Progress Notes (Signed)
Echo rescheduled for tomorrow d/t asymptomatic tachycardia. MD aware.

## 2020-01-14 NOTE — Progress Notes (Signed)
[facility]  PROGRESS NOTE  Katherine Douglas [id number] DOB: 1980-12-31 DOA: 01/11/2020 PCP: Trey Sailors, PA Admit date - 01/11/2020   Admitting Physician Toy Baker, MD  Outpatient Primary MD for the patient is Trey Sailors, PA  LOS - 3 Brief Narrative   Katherine Douglas is a 39 y.o. year old female with medical history significant for HTN nonadherence to atenolol, tobacco use, anxiety who presented on 01/11/2020 with complaints of worsening headache, congestion, generalized body aches, shortness of breath with exertion.  She states she noticed her symptoms started on Friday with headache and congestion. Felt like a normal sinus cold. She took a benadryl with minimal improvement. She knew it was getting worse because of chest tightness followed by coughing.  Tried her albuterol inhaler which made it worse. The next day tried lemon/honey tea and lozenges and stay hydrated with water. She had mouth dryness. On Saturday while walking to the bathroom she got lightheaded with worsening headache and seeing spots in front of her eyes and worsening shortness of breath. Whole body aches  Takes atenolol, she thinks 50 mg prescribed by her PA three weeks ago, .  But she didn't get it filled because she didn't want to go up on her dose. She has been off since March.   More stressful because her sister and her 5 kids live with her she also has 4 kids   Takes diazepam for anxiety  Tobacco abuse.  Smokes a pack every 2 weeks  Hospital course: Patient was found to have elevated BP with SBP in the 220s.  Patient was given Nitropaste.  She was also noted to be tachycardic, D-dimer was unremarkable.  EKG confirmed sinus tachycardia with no ischemic changes.  For WBC of 15.1, blood glucose of 164, A1c 7, troponin trend 7-/13.  RVP negative for flu, Covid test negative.  Chest x-ray showed chronic bronchitis with superimposed acute bronchitis. Patient was admitted with working  diagnosis of accelerated hypertension and COPD/asthma exacerbation.  Patient had no signs of endorgan damage on laboratory work-up (troponin trend within normal limits, EKG nonischemic, BNP unremarkable, no AKI).  Patient did have some shortness of breath particular with exertion and attributed to COPD with acute bronchitis based off chest x-ray imaging on admission and started on IV Solu-Medrol and scheduled DuoNebs  Subjective  Today she  Feels better. No CP, occasional cough. No SOB  A & P  Accelerated hypertension, improving.  SBP ranging 130s-150s after being in 200s on presentation.  In the setting of poor adherence to previous BP regimen (atenolol).   -amlodipine 10 mg, lisinopril 20 mg --avoid beta-blockers given respiratory illness   Presumed COPD with acute bronchitis, improving.  Presented with cough, worsening shortness of breath, patient active tobacco use.  Not currently requiring O2 , passed ambulatory O2 test, repeat CXR shows resolution.  -change to xopenex PRN, prednisone, azithromycin -Flutter valve, incentive spirometry -Tessalon Perles for cough  Diabetes, no previous diagnosis, with hyperglycemia worse in the setting of steroids.  A1c of 7. -Nutrition provided counseling -Outpatient referral to nutrition/diabetes management placed -Patient need close follow-up with PCP, may need to start Metformin as outpatient line -continue sliding scale as needed, monitor CBGs   Anxiety/mood disorder.  Patient admits to depressed mood related to living situation with family.  Reports taking diazepam at home. -Prozac started here -Ativan as needed, to avoid withdrawal symptoms.  Sinus tachycardia persists.  Range 120s-130s at rest up to 140s with exertion.  suspect likely  combination of rebound related to nonadherence to atenolol (has been off for a month), as well as reaction to albuterol nebs, also suspected benzo withdrawal as she takes diazepam at home.   D-dimer was  unremarkable, doubt PE, TSH within normal limits. -Obtain TTE given persistent tachycardia --d/c albuterol nebs for xopenex -Ativan as needed -Continue closely  Leukocytosis, suspect reactive in the setting of steroid use, improving.  Previously 31, down to 21. No signs of infection/pneumonia on chest x-ray.  Otherwise has no other localizing symptoms of infection and remains afebrile -Monitor CBC  morbid obesity.  BMI greater than 50 -Weight loss/lifestyle changes counseling provided  Tobacco use.  Smokes 1 pack every 2 weeks. -Cessation counseling provided     Family Communication  : None  Code Status : Full "  Disposition Plan  :  Patient is from home. Anticipated d/c date: 24 hours. Barriers to d/c or necessity for inpatient status:  Evaluation of sinus tachycardia pending TTE, Consults  : None  Procedures  : None  DVT Prophylaxis  :  Lovenox  Lab Results  Component Value Date   PLT 456 (H) 01/14/2020    Diet :  Diet Order            Diet Carb Modified Fluid consistency: Thin; Room service appropriate? Yes  Diet effective now               Inpatient Medications Scheduled Meds: . albuterol  4 puff Inhalation Once  . amLODipine  10 mg Oral Daily  . azithromycin  500 mg Oral QHS  . Chlorhexidine Gluconate Cloth  6 each Topical Daily  . docusate sodium  100 mg Oral BID  . enoxaparin (LOVENOX) injection  60 mg Subcutaneous QHS  . FLUoxetine  10 mg Oral Daily  . insulin aspart  0-6 Units Subcutaneous TID WC  . lisinopril  20 mg Oral Daily  . mometasone-formoterol  1 puff Inhalation BID  . nicotine  21 mg Transdermal Daily  . predniSONE  40 mg Oral Q breakfast  . sodium chloride flush  3 mL Intravenous Q12H  . sodium chloride flush  3 mL Intravenous Q12H   Continuous Infusions:  PRN Meds:.acetaminophen **OR** acetaminophen, benzonatate, hydrALAZINE, HYDROcodone-acetaminophen, levalbuterol, LORazepam, ondansetron **OR** ondansetron (ZOFRAN) IV  Antibiotics   :   Anti-infectives (From admission, onward)   Start     Dose/Rate Route Frequency Ordered Stop   01/13/20 0000  azithromycin (ZITHROMAX) tablet 500 mg     500 mg Oral Daily at bedtime 01/12/20 0022 01/16/20 2159   01/12/20 0030  azithromycin (ZITHROMAX) 500 mg in sodium chloride 0.9 % 250 mL IVPB     500 mg 250 mL/hr over 60 Minutes Intravenous Every 24 hours 01/12/20 0022 01/12/20 0526       Objective   Vitals:   01/14/20 1415 01/14/20 1759 01/14/20 1959 01/14/20 2139  BP: (!) 158/88 136/81  132/64  Pulse: (!) 125 (!) 112  100  Resp: 18 16  20   Temp: 99.4 F (37.4 C) 98.8 F (37.1 C)  98.9 F (37.2 C)  TempSrc: Oral Oral    SpO2: 98% 98% 96% 100%  Weight:      Height:        SpO2: 100 %  Wt Readings from Last 3 Encounters:  01/12/20 133.5 kg  10/24/19 135.2 kg  06/24/19 111.6 kg     Intake/Output Summary (Last 24 hours) at 01/14/2020 2254 Last data filed at 01/14/2020 1500 Gross per 24 hour  Intake 490 ml  Output 1300 ml  Net -810 ml    Physical Exam:     Awake Alert, Oriented X 3, Normal affect No new F.N deficits, following commands [place].AT, Normal respiratory effort on room air, scant wheezing in left upper field.  no conversational dyspnea Tachycardic, normal rhythm, no Gallops,Rubs or new Murmurs,  +ve B.Sounds, Abd Soft, No tenderness, No rebound, guarding or rigidity. No Cyanosis, No new Rash or bruise     I have personally reviewed the following:   Data Reviewed:  CBC Recent Labs  Lab 01/11/20 1647 01/12/20 0536 01/13/20 0325 01/14/20 0610  WBC 15.1* 18.5* 31.1* 21.4*  HGB 13.9 12.3 11.4* 11.3*  HCT 44.2 39.1 36.8 36.1  PLT 473* 455* 443* 456*  MCV 91.3 89.5 90.2 91.2  MCH 28.7 28.1 27.9 28.5  MCHC 31.4 31.5 31.0 31.3  RDW 14.2 14.0 14.4 14.4  LYMPHSABS  --  1.0  --   --   MONOABS  --  0.2  --   --   EOSABS  --  0.0  --   --   BASOSABS  --  0.0  --   --     Chemistries  Recent Labs  Lab 01/11/20 1647 01/12/20 0536  01/13/20 0325 01/14/20 0610  NA 140 136 140 138  K 4.7 4.1 3.8 3.8  CL 103 105 106 106  CO2 31 22 24 24   GLUCOSE 164* 285* 109* 106*  BUN 7 11 13 14   CREATININE 0.86 1.00 0.75 0.65  CALCIUM 8.9 9.2 9.0 8.3*  MG  --  2.1  --   --   AST  --  20  --   --   ALT  --  22  --   --   ALKPHOS  --  86  --   --   BILITOT  --  0.6  --   --    ------------------------------------------------------------------------------------------------------------------ No results for input(s): CHOL, HDL, LDLCALC, TRIG, CHOLHDL, LDLDIRECT in the last 72 hours.  Lab Results  Component Value Date   HGBA1C 7.1 (H) 01/12/2020   ------------------------------------------------------------------------------------------------------------------ Recent Labs    01/12/20 0536  TSH 0.613   ------------------------------------------------------------------------------------------------------------------ No results for input(s): VITAMINB12, FOLATE, FERRITIN, TIBC, IRON, RETICCTPCT in the last 72 hours.  Coagulation profile No results for input(s): INR, PROTIME in the last 168 hours.  Recent Labs    01/12/20 0536  DDIMER <0.27    Cardiac Enzymes No results for input(s): CKMB, TROPONINI, MYOGLOBIN in the last 168 hours.  Invalid input(s): CK ------------------------------------------------------------------------------------------------------------------    Component Value Date/Time   BNP 45.5 01/11/2020 1647    Micro Results Recent Results (from the past 240 hour(s))  Respiratory Panel by RT PCR (Flu A&B, Covid) - Nasopharyngeal Swab     Status: None   Collection Time: 01/11/20  9:32 PM   Specimen: Nasopharyngeal Swab  Result Value Ref Range Status   SARS Coronavirus 2 by RT PCR NEGATIVE NEGATIVE Final    Comment: (NOTE) SARS-CoV-2 target nucleic acids are NOT DETECTED. The SARS-CoV-2 RNA is generally detectable in upper respiratoy specimens during the acute phase of infection. The  lowest concentration of SARS-CoV-2 viral copies this assay can detect is 131 copies/mL. A negative result does not preclude SARS-Cov-2 infection and should not be used as the sole basis for treatment or other patient management decisions. A negative result may occur with  improper specimen collection/handling, submission of specimen other than nasopharyngeal swab, presence of viral mutation(s) within the areas targeted  by this assay, and inadequate number of viral copies (<131 copies/mL). A negative result must be combined with clinical observations, patient history, and epidemiological information. The expected result is Negative. Fact Sheet for Patients:  PinkCheek.be Fact Sheet for Healthcare Providers:  GravelBags.it This test is not yet ap proved or cleared by the [place] FDA and  has been authorized for detection and/or diagnosis of SARS-CoV-2 by FDA under an Emergency Use Authorization (EUA). This EUA will remain  in effect (meaning this test can be used) for the duration of the COVID-19 declaration under Section 564(b)(1) of the Act, 21 U.S.C. section 360bbb-3(b)(1), unless the authorization is terminated or revoked sooner.    Influenza A by PCR NEGATIVE NEGATIVE Final   Influenza B by PCR NEGATIVE NEGATIVE Final    Comment: (NOTE) The Xpert Xpress SARS-CoV-2/FLU/RSV assay is intended as an aid in  the diagnosis of influenza from Nasopharyngeal swab specimens and  should not be used as a sole basis for treatment. Nasal washings and  aspirates are unacceptable for Xpert Xpress SARS-CoV-2/FLU/RSV  testing. Fact Sheet for Patients: PinkCheek.be Fact Sheet for Healthcare Providers: GravelBags.it This test is not yet approved or cleared by the [place] FDA and  has been authorized for detection and/or diagnosis of SARS-CoV-2 by  FDA under an Emergency  Use Authorization (EUA). This EUA will remain  in effect (meaning this test can be used) for the duration of the  Covid-19 declaration under Section 564(b)(1) of the Act, 21  U.S.C. section 360bbb-3(b)(1), unless the authorization is  terminated or revoked. Performed at [facility], [place] [address], [place], [place] [address]   Culture, sputum-assessment     Status: None   Collection Time: 01/12/20 12:23 AM   Specimen: Tracheal Aspirate; Sputum  Result Value Ref Range Status   Specimen Description TRACHEAL ASPIRATE  Final   Special Requests NONE  Final   Sputum evaluation   Final    THIS SPECIMEN IS ACCEPTABLE FOR SPUTUM CULTURE Performed at [facility], [place] [address] [place], [place] [address]    Report Status 01/12/2020 FINAL  Final  Culture, respiratory     Status: None   Collection Time: 01/12/20 12:23 AM   Specimen: Tracheal Aspirate  Result Value Ref Range Status   Specimen Description   Final    TRACHEAL ASPIRATE Performed at [facility] [address] [place], [place] [address]    Special Requests   Final    NONE Reflexed from [id number] Performed at [facility], [place] [address], [place], [place] [address]    Gram Stain   Final    FEW WBC PRESENT, PREDOMINANTLY PMN NO ORGANISMS SEEN    Culture   Final    Consistent with normal respiratory flora. Performed at [facility], [place] [address] [place], [place] [address]    Report Status 01/14/2020 FINAL  Final  MRSA PCR Screening     Status: None   Collection Time: 01/12/20  9:30 PM   Specimen: Nasopharyngeal  Result Value Ref Range Status   MRSA by PCR NEGATIVE NEGATIVE Final    Comment:        The GeneXpert MRSA Assay (FDA approved for NASAL specimens only), is one component of a comprehensive MRSA colonization surveillance program. It is not intended to diagnose MRSA infection nor to guide or monitor treatment for MRSA  infections. Performed at [facility], [place] [address], [place], [place] [address]  Radiology Reports DG Chest 2 View  Result Date: 01/11/2020 CLINICAL DATA:  Cough, shortness of breath and myalgias. Smoker. EXAM: CHEST - 2 VIEW COMPARISON:  11/18/2016 FINDINGS: Normal sized heart. Clear lungs with normal vascularity. Mild peribronchial thickening with scratch the interval mild peribronchial thickening and interval flattening of the hemidiaphragms on the lateral view. Mild thoracic spine degenerative changes. IMPRESSION: Interval mild changes of COPD and chronic bronchitis with possible superimposed acute bronchitis. Electronically Signed   By: Claudie Revering M.D.   On: 01/11/2020 16:48   DG CHEST PORT 1 VIEW  Result Date: 01/13/2020 CLINICAL DATA:  Worsening shortness of breath and hypoxia. EXAM: PORTABLE CHEST 1 VIEW COMPARISON:  01/11/2020 FINDINGS: 0956 hours. The lungs are clear without focal pneumonia, edema, pneumothorax or pleural effusion. The cardiopericardial silhouette is within normal limits for size. The visualized bony structures of the thorax are intact. Telemetry leads overlie the chest. IMPRESSION: No active disease. Electronically Signed   By: Misty Stanley M.D.   On: 01/13/2020 10:08     Time Spent in minutes  30     Desiree Hane M.D on 01/14/2020 at 10:54 PM  To page go to [email or web address] - password [email or web address]

## 2020-01-14 NOTE — Progress Notes (Signed)
SATURATION QUALIFICATIONS: (This note is used to comply with regulatory documentation for home oxygen)  Patient Saturations on Room Air at Rest = 100%  Patient Saturations on Room Air while Ambulating = 97%  Patient tolerated walking fair. She did get SOB mid walk and needed to sit for 3 mins.   [place] Settle, SRN

## 2020-01-14 NOTE — CV Procedure (Signed)
Echo attempted but HR was elevated at 135. Will try again when HR is stablized.

## 2020-01-15 ENCOUNTER — Inpatient Hospital Stay (HOSPITAL_COMMUNITY): Payer: Medicaid Other

## 2020-01-15 DIAGNOSIS — I340 Nonrheumatic mitral (valve) insufficiency: Secondary | ICD-10-CM

## 2020-01-15 LAB — ECHOCARDIOGRAM COMPLETE
Height: 62 in
Weight: 4709.03 oz

## 2020-01-15 LAB — GLUCOSE, CAPILLARY
Glucose-Capillary: 135 mg/dL — ABNORMAL HIGH (ref 70–99)
Glucose-Capillary: 209 mg/dL — ABNORMAL HIGH (ref 70–99)
Glucose-Capillary: 196 mg/dL — ABNORMAL HIGH (ref 70–99)

## 2020-01-15 MED ORDER — MOMETASONE FURO-FORMOTEROL FUM 200-5 MCG/ACT IN AERO: 1.0000 | INHALATION_SPRAY | Freq: Two times a day (BID) | RESPIRATORY_TRACT | 0 refills | Status: DC

## 2020-01-15 MED ORDER — AMLODIPINE BESYLATE 10 MG PO TABS: 10.0000 mg | ORAL_TABLET | Freq: Every day | ORAL | 0 refills | Status: DC

## 2020-01-15 MED ORDER — BENZONATATE 100 MG PO CAPS: 100.0000 mg | ORAL_CAPSULE | Freq: Three times a day (TID) | ORAL | 0 refills | Status: DC | PRN

## 2020-01-15 MED ORDER — PREDNISONE 20 MG PO TABS: 40.0000 mg | ORAL_TABLET | Freq: Every day | ORAL | 0 refills | Status: AC

## 2020-01-15 MED ORDER — LISINOPRIL 20 MG PO TABS: 20.0000 mg | ORAL_TABLET | Freq: Every day | ORAL | 0 refills | Status: DC

## 2020-01-15 MED ORDER — NICOTINE 21 MG/24HR TD PT24: 21.0000 mg | MEDICATED_PATCH | Freq: Every day | TRANSDERMAL | 0 refills | Status: DC

## 2020-01-15 MED ORDER — ALBUTEROL SULFATE HFA 108 (90 BASE) MCG/ACT IN AERS: 4.0000 | INHALATION_SPRAY | Freq: Four times a day (QID) | RESPIRATORY_TRACT | 0 refills | Status: DC | PRN

## 2020-01-15 MED ORDER — AZITHROMYCIN 250 MG PO TABS: 250.0000 mg | ORAL_TABLET | Freq: Every day | ORAL | 0 refills | Status: DC

## 2020-01-15 MED ORDER — FLUOXETINE HCL 10 MG PO CAPS: 10.0000 mg | ORAL_CAPSULE | Freq: Every day | ORAL | 3 refills | Status: DC

## 2020-01-15 MED ORDER — SODIUM CHLORIDE 0.9 % IV BOLUS
500.0000 mL | Freq: Once | INTRAVENOUS | Status: AC
  Administered 2020-01-15: 500 mL via INTRAVENOUS

## 2020-01-15 NOTE — Progress Notes (Signed)
  Echocardiogram 2D Echocardiogram has been performed.  Katherine Douglas 01/15/2020, 1:34 PM
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
## 2020-01-15 NOTE — Discharge Summary (Signed)
Physician Discharge Summary  Katherine Douglas [id number] DOB: 10-13-1980 DOA: 01/11/2020  PCP: Trey Sailors, PA  Admit date: 01/11/2020 Discharge date: 01/15/2020  Admitted From: Home  Disposition:  Home   Recommendations for Outpatient Follow-up:  Follow up with PCP in 1-2 weeks Please obtain BMP/CBC in one week Consider resuming BB when Bronchospasm improved.  Patient will need PFT Will benefit from cardiology referral.    Discharge Condition Stable CODE STATUS: full code Diet recommendation: Heart Healthy    Brief/Interim Summary: Patient was found to have elevated BP with SBP in the 220s.  Patient was given Nitropaste.  She was also noted to be tachycardic, D-dimer was unremarkable.  EKG confirmed sinus tachycardia with no ischemic changes.  For WBC of 15.1, blood glucose of 164, A1c 7, troponin trend 7-/13.  RVP negative for flu, Covid test negative.  Chest x-ray showed chronic bronchitis with superimposed acute bronchitis. Patient was admitted with working diagnosis of accelerated hypertension and COPD/asthma exacerbation.  Patient had no signs of endorgan damage on laboratory work-up (troponin trend within normal limits, EKG nonischemic, BNP unremarkable, no AKI).  Patient did have some shortness of breath particular with exertion and attributed to COPD with acute bronchitis based off chest x-ray imaging on admission and started on IV Solu-Medrol and scheduled DuoNebs  Accelerated hypertension, improving.  SBP ranging 130s-150s after being in 200s on presentation.  In the setting of poor adherence to previous BP regimen (atenolol).   -amlodipine 10 mg, lisinopril 20 mg --consider resuming BB out patient to help control HR.    Presumed COPD with acute bronchitis, improving.  Presented with cough, worsening shortness of breath, patient active tobacco use.  Not currently requiring O2 , passed ambulatory O2 test, repeat CXR shows resolution.  -change to xopenex PRN,  prednisone, azithromycin -Flutter valve, incentive spirometry -Tessalon Perles for cough -improved , discharge on prednisone.   Diabetes, no previous diagnosis, with hyperglycemia worse in the setting of steroids.  A1c of 7. -Nutrition provided counseling -Outpatient referral to nutrition/diabetes management placed -Patient need close follow-up with PCP, may need to start Metformin as outpatient line -continue sliding scale as needed, monitor CBGs   Anxiety/mood disorder.  Patient admits to depressed mood related to living situation with family.  Reports taking diazepam at home. PCP stopped.  -Prozac resume. Evaluated by pachy recommend to continue with prozac.    Sinus tachycardia persists.  Range 120s-130s at rest up to 140s with exertion.  suspect likely combination of rebound related to nonadherence to atenolol (has been off for a month), as well as reaction to albuterol nebs, also suspected benzo withdrawal as she takes diazepam at home.   D-dimer was unremarkable, doubt PE, TSH within normal limits. -Obtain TTE given persistent tachycardia, ECHO normal EF, ventricular hypertrophy advised to follow up with cardiology -Ativan as needed -Continue closely  Leukocytosis, suspect reactive in the setting of steroid use, improving.  Previously 31, down to 21. No signs of infection/pneumonia on chest x-ray.  Otherwise has no other localizing symptoms of infection and remains afebrile -Monitor CBC  morbid obesity.  BMI greater than 50 -Weight loss/lifestyle changes counseling provided  Tobacco use.  Smokes 1 pack every 2 weeks. -Cessation counseling provided   Discharge Diagnoses:  Active Problems:   Morbid obesity ([place])   Asthma exacerbation   Bronchitis due to tobacco use   Tobacco abuse   Hypertensive urgency   Tachycardia   Leukocytosis   Hyperglycemia   Anxiety   Hypertension complicating diabetes (  [place])    Discharge Instructions  Discharge Instructions    Diet  - low sodium heart healthy   Complete by: As directed    Increase activity slowly   Complete by: As directed    Referral to Nutrition and Diabetes Services   Complete by: As directed    Choose type of Diabetes Self-Management Training (DSMT) training services and number of hours requested: Initial DSMT: 10 hours   Check all special needs that apply to patient requiring 1 on 1 DSMT: Low literacy   DSMT Content: Comprehensive self-management skills- All of the content areas   Choose the type of Medical Nutrition Therapy (MNT) and number of hours: Initial MNT: 3 hours   FOR MEDICARE PATIENTS: I hereby certify that I am managing this beneficiary's diabetes condition and that the above prescribed training is a necessary part of management.: Does not apply     Allergies as of 01/15/2020      Reactions   Other Anaphylaxis   Crawfish    Amoxicillin Hives   Has patient had a PCN reaction causing immediate rash, facial/tongue/throat swelling, SOB or lightheadedness with hypotension:unsure Has patient had a PCN reaction causing severe rash involving mucus membranes or skin necrosis:No Has patient had a PCN reaction that required hospitalization:No Has patient had a PCN reaction occurring within the last 10 years: No If all of the above answers are "NO", then may proceed with Cephalosporin use.   Peanut-containing Drug Products Itching   Penicillins Itching   Has patient had a PCN reaction causing immediate rash, facial/tongue/throat swelling, SOB or lightheadedness with hypotension:unsure Has patient had a PCN reaction causing severe rash involving mucus membranes or skin necrosis:No Has patient had a PCN reaction that required hospitalization:No Has patient had a PCN reaction occurring within the last 10 years: No If all of the above answers are "NO", then may proceed with Cephalosporin use.   Strawberry Extract Hives, Itching, Rash      Medication List    STOP taking these medications    atenolol-chlorthalidone 50-25 MG tablet Commonly known as: TENORETIC   ibuprofen 800 MG tablet Commonly known as: ADVIL   megestrol 40 MG tablet Commonly known as: MEGACE     TAKE these medications   albuterol 108 (90 Base) MCG/ACT inhaler Commonly known as: VENTOLIN HFA Inhale 4 puffs into the lungs every 6 (six) hours as needed for wheezing or shortness of breath.   amLODipine 10 MG tablet Commonly known as: NORVASC Take 1 tablet (10 mg total) by mouth daily. Start taking on: January 16, 2020   azithromycin 250 MG tablet Commonly known as: ZITHROMAX Take 1 tablet (250 mg total) by mouth daily.   benzonatate 100 MG capsule Commonly known as: TESSALON Take 1 capsule (100 mg total) by mouth 3 (three) times daily as needed for cough.   etonogestrel 68 MG Impl implant Commonly known as: NEXPLANON 1 each by Subdermal route once.   FLUoxetine 10 MG capsule Commonly known as: PROZAC Take 1 capsule (10 mg total) by mouth daily.   lisinopril 20 MG tablet Commonly known as: ZESTRIL Take 1 tablet (20 mg total) by mouth daily. Start taking on: January 16, 2020   meloxicam 15 MG tablet Commonly known as: MOBIC Take 15 mg by mouth daily.   mometasone-formoterol 200-5 MCG/ACT Aero Commonly known as: DULERA Inhale 1 puff into the lungs 2 (two) times daily.   nicotine 21 mg/24hr patch Commonly known as: NICODERM CQ - dosed in mg/24 hours Place 1  patch (21 mg total) onto the skin daily. Start taking on: January 16, 2020   predniSONE 20 MG tablet Commonly known as: DELTASONE Take 2 tablets (40 mg total) by mouth daily with breakfast for 5 days. Start taking on: January 16, 2020      Follow-up Information    Trey Sailors, [place] Follow up.   Specialty: Physician Assistant Contact information: [place] [address] [phone]        [facility] Follow up.   Why: They have therapists, a psychiatrist and community support team.  Ms Mikael Spray  will call you tomorrow to set up an appointment with them.  Call if you have not heard from them by the end of the day Contact information: [address], [place],  [address] Phone: [phone]         Allergies  Allergen Reactions  . Other Anaphylaxis    Crawfish   . Amoxicillin Hives    Has patient had a PCN reaction causing immediate rash, facial/tongue/throat swelling, SOB or lightheadedness with hypotension:unsure Has patient had a PCN reaction causing severe rash involving mucus membranes or skin necrosis:No Has patient had a PCN reaction that required hospitalization:No Has patient had a PCN reaction occurring within the last 10 years: No If all of the above answers are "NO", then may proceed with Cephalosporin use.   Marland Kitchen Peanut-Containing Drug Products Itching  . Penicillins Itching    Has patient had a PCN reaction causing immediate rash, facial/tongue/throat swelling, SOB or lightheadedness with hypotension:unsure Has patient had a PCN reaction causing severe rash involving mucus membranes or skin necrosis:No Has patient had a PCN reaction that required hospitalization:No Has patient had a PCN reaction occurring within the last 10 years: No If all of the above answers are "NO", then may proceed with Cephalosporin use.    . Strawberry Extract Hives, Itching and Rash    Consultations:  none   Procedures/Studies: DG Chest 2 View  Result Date: 01/11/2020 CLINICAL DATA:  Cough, shortness of breath and myalgias. Smoker. EXAM: CHEST - 2 VIEW COMPARISON:  11/18/2016 FINDINGS: Normal sized heart. Clear lungs with normal vascularity. Mild peribronchial thickening with scratch the interval mild peribronchial thickening and interval flattening of the hemidiaphragms on the lateral view. Mild thoracic spine degenerative changes. IMPRESSION: Interval mild changes of COPD and chronic bronchitis with possible superimposed acute bronchitis. Electronically Signed   By: Claudie Revering M.D.   On: 01/11/2020 16:48   DG CHEST PORT 1 VIEW  Result Date: 01/13/2020 CLINICAL DATA:  Worsening shortness of breath and hypoxia. EXAM: PORTABLE CHEST 1 VIEW COMPARISON:  01/11/2020 FINDINGS: 0956 hours. The lungs are clear without focal pneumonia, edema, pneumothorax or pleural effusion. The cardiopericardial silhouette is within normal limits for size. The visualized bony structures of the thorax are intact. Telemetry leads overlie the chest. IMPRESSION: No active disease. Electronically Signed   By: Misty Stanley M.D.   On: 01/13/2020 10:08   ECHOCARDIOGRAM COMPLETE  Result Date: 01/15/2020    ECHOCARDIOGRAM REPORT   Patient Name:   Katherine Douglas Date of Exam: 01/15/2020 Medical Rec #:  [id number]           Height:       62.0 in Accession #:    [id number]          Weight:       294.3 lb Date of Birth:  [birth year]           BSA:  2.253 m Patient Age:    39 years            BP:           124/62 mmHg Patient Gender: F                   HR:           106 bpm. Exam Location:  Inpatient Procedure: 2D Echo, Cardiac Doppler and Color Doppler Indications:    Hypertensive urgency                 Sinus tachycardia  History:        Patient has no prior history of Echocardiogram examinations.                 Risk Factors:Current Smoker, Hypertension and Diabetes.  Sonographer:    Clayton Lefort RDCS (AE) Referring Phys: [id number] [place]  Sonographer Comments: No subcostal window and patient is morbidly obese. Image acquisition challenging due to respiratory motion. IMPRESSIONS  1. Left ventricular ejection fraction, by estimation, is 55 to 60%. The left ventricle has normal function. The left ventricle has no regional wall motion abnormalities. There is moderate concentric left ventricular hypertrophy. Indeterminate diastolic filling due to E-A fusion.  2. Right ventricular systolic function is normal. The right ventricular size is normal. Tricuspid regurgitation signal is inadequate for  assessing PA pressure.  3. The mitral valve is grossly normal. Mild mitral valve regurgitation. No evidence of mitral stenosis.  4. The aortic valve is tricuspid. Aortic valve regurgitation is not visualized. No aortic stenosis is present. FINDINGS  Left Ventricle: Left ventricular ejection fraction, by estimation, is 55 to 60%. The left ventricle has normal function. The left ventricle has no regional wall motion abnormalities. The left ventricular internal cavity size was normal in size. There is  moderate concentric left ventricular hypertrophy. Indeterminate diastolic filling due to E-A fusion. Right Ventricle: The right ventricular size is normal. No increase in right ventricular wall thickness. Right ventricular systolic function is normal. Tricuspid regurgitation signal is inadequate for assessing PA pressure. Left Atrium: Left atrial size was normal in size. Right Atrium: Right atrial size was normal in size. Pericardium: Trivial pericardial effusion is present. The pericardial effusion is circumferential. Presence of pericardial fat pad. Mitral Valve: The mitral valve is grossly normal. Mild mitral valve regurgitation. No evidence of mitral valve stenosis. MV peak gradient, 8.3 mmHg. The mean mitral valve gradient is 4.0 mmHg. Tricuspid Valve: The tricuspid valve is grossly normal. Tricuspid valve regurgitation is not demonstrated. No evidence of tricuspid stenosis. Aortic Valve: The aortic valve is tricuspid. Aortic valve regurgitation is not visualized. No aortic stenosis is present. Aortic valve mean gradient measures 5.0 mmHg. Aortic valve peak gradient measures 12.5 mmHg. Aortic valve area, by VTI measures 2.00  cm. Pulmonic Valve: The pulmonic valve was grossly normal. Pulmonic valve regurgitation is not visualized. No evidence of pulmonic stenosis. Aorta: The aortic root and ascending aorta are structurally normal, with no evidence of dilitation. Venous: The inferior vena cava was not well  visualized. IAS/Shunts: The atrial septum is grossly normal.  LEFT VENTRICLE PLAX 2D LVIDd:         3.30 cm LVIDs:         2.30 cm LV PW:         1.30 cm LV IVS:        1.40 cm LVOT diam:     1.90 cm LV SV:  65 LV SV Index:   29 LVOT Area:     2.84 cm  RIGHT VENTRICLE RV Basal diam:  3.00 cm RV S prime:     16.00 cm/s TAPSE (M-mode): 2.4 cm LEFT ATRIUM             Index       RIGHT ATRIUM           Index LA diam:        3.30 cm 1.46 cm/m  RA Area:     11.40 cm LA Vol (A2C):   42.4 ml 18.82 ml/m RA Volume:   27.40 ml  12.16 ml/m LA Vol (A4C):   43.1 ml 19.13 ml/m LA Biplane Vol: 44.1 ml 19.58 ml/m  AORTIC VALVE AV Area (Vmax):    2.07 cm AV Area (Vmean):   2.69 cm AV Area (VTI):     2.00 cm AV Vmax:           177.00 cm/s AV Vmean:          103.000 cm/s AV VTI:            0.325 m AV Peak Grad:      12.5 mmHg AV Mean Grad:      5.0 mmHg LVOT Vmax:         129.00 cm/s LVOT Vmean:        97.700 cm/s LVOT VTI:          0.229 m LVOT/AV VTI ratio: 0.70  AORTA Ao Root diam: 2.70 cm Ao Asc diam:  3.20 cm MITRAL VALVE MV Peak grad: 8.3 mmHg  SHUNTS MV Mean grad: 4.0 mmHg  Systemic VTI:  0.23 m MV Vmax:      1.44 m/s  Systemic Diam: 1.90 cm MV Vmean:     88.0 cm/s Eleonore Chiquito MD Electronically signed by Eleonore Chiquito MD Signature Date/Time: 01/15/2020/2:21:17 PM    Final    (Echo, Carotid, EGD, Colonoscopy, ERCP)    Subjective:   Discharge Exam: Vitals:   01/15/20 0800 01/15/20 1022  BP:  124/62  Pulse:  87  Resp:    Temp:    SpO2: 97%      General: Pt is alert, awake, not in acute distress Cardiovascular: RRR, S1/S2 +, no rubs, no gallops Respiratory: CTA bilaterally, no wheezing, no rhonchi Abdominal: Soft, NT, ND, bowel sounds + Extremities: no edema, no cyanosis    The results of significant diagnostics from this hospitalization (including imaging, microbiology, ancillary and laboratory) are listed below for reference.     Microbiology: Recent Results (from the past 240  hour(s))  Respiratory Panel by RT PCR (Flu A&B, Covid) - Nasopharyngeal Swab     Status: None   Collection Time: 01/11/20  9:32 PM   Specimen: Nasopharyngeal Swab  Result Value Ref Range Status   SARS Coronavirus 2 by RT PCR NEGATIVE NEGATIVE Final    Comment: (NOTE) SARS-CoV-2 target nucleic acids are NOT DETECTED. The SARS-CoV-2 RNA is generally detectable in upper respiratoy specimens during the acute phase of infection. The lowest concentration of SARS-CoV-2 viral copies this assay can detect is 131 copies/mL. A negative result does not preclude SARS-Cov-2 infection and should not be used as the sole basis for treatment or other patient management decisions. A negative result may occur with  improper specimen collection/handling, submission of specimen other than nasopharyngeal swab, presence of viral mutation(s) within the areas targeted by this assay, and inadequate number of viral copies (<131 copies/mL). A negative result must  be combined with clinical observations, patient history, and epidemiological information. The expected result is Negative. Fact Sheet for Patients:  PinkCheek.be Fact Sheet for Healthcare Providers:  GravelBags.it This test is not yet ap proved or cleared by the [place] FDA and  has been authorized for detection and/or diagnosis of SARS-CoV-2 by FDA under an Emergency Use Authorization (EUA). This EUA will remain  in effect (meaning this test can be used) for the duration of the COVID-19 declaration under Section 564(b)(1) of the Act, 21 U.S.C. section 360bbb-3(b)(1), unless the authorization is terminated or revoked sooner.    Influenza A by PCR NEGATIVE NEGATIVE Final   Influenza B by PCR NEGATIVE NEGATIVE Final    Comment: (NOTE) The Xpert Xpress SARS-CoV-2/FLU/RSV assay is intended as an aid in  the diagnosis of influenza from Nasopharyngeal swab specimens and  should not be used as  a sole basis for treatment. Nasal washings and  aspirates are unacceptable for Xpert Xpress SARS-CoV-2/FLU/RSV  testing. Fact Sheet for Patients: PinkCheek.be Fact Sheet for Healthcare Providers: GravelBags.it This test is not yet approved or cleared by the [place] FDA and  has been authorized for detection and/or diagnosis of SARS-CoV-2 by  FDA under an Emergency Use Authorization (EUA). This EUA will remain  in effect (meaning this test can be used) for the duration of the  Covid-19 declaration under Section 564(b)(1) of the Act, 21  U.S.C. section 360bbb-3(b)(1), unless the authorization is  terminated or revoked. Performed at [facility], [place] [address] [place], [place] [address]   Culture, sputum-assessment     Status: None   Collection Time: 01/12/20 12:23 AM   Specimen: Tracheal Aspirate; Sputum  Result Value Ref Range Status   Specimen Description TRACHEAL ASPIRATE  Final   Special Requests NONE  Final   Sputum evaluation   Final    THIS SPECIMEN IS ACCEPTABLE FOR SPUTUM CULTURE Performed at [facility], [place] [address] [place], [place] [address]    Report Status 01/12/2020 FINAL  Final  Culture, respiratory     Status: None   Collection Time: 01/12/20 12:23 AM   Specimen: Tracheal Aspirate  Result Value Ref Range Status   Specimen Description   Final    TRACHEAL ASPIRATE Performed at [place] [address] [place], [place] [address]    Special Requests   Final    NONE Reflexed from [id number] Performed at [facility], [place] [address], [place], [place] [address]    Gram Stain   Final    FEW WBC PRESENT, PREDOMINANTLY PMN NO ORGANISMS SEEN    Culture   Final    Consistent with normal respiratory flora. Performed at [facility], [place] [address] [place], [place] [address]    Report Status 01/14/2020 FINAL  Final   MRSA PCR Screening     Status: None   Collection Time: 01/12/20  9:30 PM   Specimen: Nasopharyngeal  Result Value Ref Range Status   MRSA by PCR NEGATIVE NEGATIVE Final    Comment:        The GeneXpert MRSA Assay (FDA approved for NASAL specimens only), is one component of a comprehensive MRSA colonization surveillance program. It is not intended to diagnose MRSA infection nor to guide or monitor treatment for MRSA infections. Performed at [facility], [place] [address] [place],  [address]      Labs: BNP (last 3 results) Recent Labs    01/11/20 1647  BNP [id number]   Basic Metabolic Panel: Recent Labs  Lab 01/11/20 1647 01/12/20 0536 01/13/20 0325 01/14/20 0610  NA 140 136 140 138  K 4.7 4.1 3.8 3.8  CL 103 105 106 106  CO2 31 22 24 24   GLUCOSE 164* 285* 109* 106*  BUN 7 11 13 14   CREATININE 0.86 1.00 0.75 0.65  CALCIUM 8.9 9.2 9.0 8.3*  MG  --  2.1  --   --   PHOS  --  1.7*  --   --    Liver Function Tests: Recent Labs  Lab 01/12/20 0536  AST 20  ALT 22  ALKPHOS 86  BILITOT 0.6  PROT 7.0  ALBUMIN 3.4*   No results for input(s): LIPASE, AMYLASE in the last 168 hours. No results for input(s): AMMONIA in the last 168 hours. CBC: Recent Labs  Lab 01/11/20 1647 01/12/20 0536 01/13/20 0325 01/14/20 0610  WBC 15.1* 18.5* 31.1* 21.4*  NEUTROABS  --  17.0*  --   --   HGB 13.9 12.3 11.4* 11.3*  HCT 44.2 39.1 36.8 36.1  MCV 91.3 89.5 90.2 91.2  PLT 473* 455* 443* 456*   Cardiac Enzymes: No results for input(s): CKTOTAL, CKMB, CKMBINDEX, TROPONINI in the last 168 hours. BNP: Invalid input(s): POCBNP CBG: Recent Labs  Lab 01/14/20 1149 01/14/20 1658 01/14/20 2140 01/15/20 0824 01/15/20 1216  GLUCAP 192* 162* 177* 135* 209*   D-Dimer No results for input(s): DDIMER in the last 72 hours. Hgb A1c No results for input(s): HGBA1C in the last 72 hours. Lipid Profile No results for input(s): CHOL, HDL, LDLCALC, TRIG, CHOLHDL,  LDLDIRECT in the last 72 hours. Thyroid function studies No results for input(s): TSH, T4TOTAL, T3FREE, THYROIDAB in the last 72 hours.  Invalid input(s): FREET3 Anemia work up No results for input(s): VITAMINB12, FOLATE, FERRITIN, TIBC, IRON, RETICCTPCT in the last 72 hours. Urinalysis    Component Value Date/Time   COLORURINE YELLOW 01/12/2020 0536   APPEARANCEUR CLEAR 01/12/2020 0536   LABSPEC 1.028 01/12/2020 0536   PHURINE 6.0 01/12/2020 0536   GLUCOSEU >=500 (A) 01/12/2020 0536   HGBUR LARGE (A) 01/12/2020 0536   BILIRUBINUR NEGATIVE 01/12/2020 0536   BILIRUBINUR negative 04/08/2015 1637   KETONESUR 5 (A) 01/12/2020 0536   PROTEINUR 30 (A) 01/12/2020 0536   UROBILINOGEN negative 04/08/2015 1637   UROBILINOGEN 0.2 04/04/2015 1218   NITRITE NEGATIVE 01/12/2020 0536   LEUKOCYTESUR NEGATIVE 01/12/2020 0536   Sepsis Labs Invalid input(s): PROCALCITONIN,  WBC,  LACTICIDVEN Microbiology Recent Results (from the past 240 hour(s))  Respiratory Panel by RT PCR (Flu A&B, Covid) - Nasopharyngeal Swab     Status: None   Collection Time: 01/11/20  9:32 PM   Specimen: Nasopharyngeal Swab  Result Value Ref Range Status   SARS Coronavirus 2 by RT PCR NEGATIVE NEGATIVE Final    Comment: (NOTE) SARS-CoV-2 target nucleic acids are NOT DETECTED. The SARS-CoV-2 RNA is generally detectable in upper respiratoy specimens during the acute phase of infection. The lowest concentration of SARS-CoV-2 viral copies this assay can detect is 131 copies/mL. A negative result does not preclude SARS-Cov-2 infection and should not be used as the sole basis for treatment or other patient management decisions. A negative result may occur with  improper specimen collection/handling, submission of specimen other than nasopharyngeal swab, presence of viral mutation(s) within the areas targeted by this assay, and inadequate number of viral copies (<131 copies/mL). A negative result must be combined with  clinical observations, patient history, and epidemiological  information. The expected result is Negative. Fact Sheet for Patients:  PinkCheek.be Fact Sheet for Healthcare Providers:  GravelBags.it This test is not yet ap proved or cleared by the [place] FDA and  has been authorized for detection and/or diagnosis of SARS-CoV-2 by FDA under an Emergency Use Authorization (EUA). This EUA will remain  in effect (meaning this test can be used) for the duration of the COVID-19 declaration under Section 564(b)(1) of the Act, 21 U.S.C. section 360bbb-3(b)(1), unless the authorization is terminated or revoked sooner.    Influenza A by PCR NEGATIVE NEGATIVE Final   Influenza B by PCR NEGATIVE NEGATIVE Final    Comment: (NOTE) The Xpert Xpress SARS-CoV-2/FLU/RSV assay is intended as an aid in  the diagnosis of influenza from Nasopharyngeal swab specimens and  should not be used as a sole basis for treatment. Nasal washings and  aspirates are unacceptable for Xpert Xpress SARS-CoV-2/FLU/RSV  testing. Fact Sheet for Patients: PinkCheek.be Fact Sheet for Healthcare Providers: GravelBags.it This test is not yet approved or cleared by the [place] FDA and  has been authorized for detection and/or diagnosis of SARS-CoV-2 by  FDA under an Emergency Use Authorization (EUA). This EUA will remain  in effect (meaning this test can be used) for the duration of the  Covid-19 declaration under Section 564(b)(1) of the Act, 21  U.S.C. section 360bbb-3(b)(1), unless the authorization is  terminated or revoked. Performed at [facility], [place] [address], [place], [place] [address]   Culture, sputum-assessment     Status: None   Collection Time: 01/12/20 12:23 AM   Specimen: Tracheal Aspirate; Sputum  Result Value Ref Range Status   Specimen Description TRACHEAL  ASPIRATE  Final   Special Requests NONE  Final   Sputum evaluation   Final    THIS SPECIMEN IS ACCEPTABLE FOR SPUTUM CULTURE Performed at [facility], [place] [address], [place], [place] [address]    Report Status 01/12/2020 FINAL  Final  Culture, respiratory     Status: None   Collection Time: 01/12/20 12:23 AM   Specimen: Tracheal Aspirate  Result Value Ref Range Status   Specimen Description   Final    TRACHEAL ASPIRATE Performed at [place] [address] [place], [place] [address]    Special Requests   Final    NONE Reflexed from [id number] Performed at [facility], [place] [address] [place], [place] [address]    Gram Stain   Final    FEW WBC PRESENT, PREDOMINANTLY PMN NO ORGANISMS SEEN    Culture   Final    Consistent with normal respiratory flora. Performed at [facility], [place] [address], [place], [place] [address]    Report Status 01/14/2020 FINAL  Final  MRSA PCR Screening     Status: None   Collection Time: 01/12/20  9:30 PM   Specimen: Nasopharyngeal  Result Value Ref Range Status   MRSA by PCR NEGATIVE NEGATIVE Final    Comment:        The GeneXpert MRSA Assay (FDA approved for NASAL specimens only), is one component of a comprehensive MRSA colonization surveillance program. It is not intended to diagnose MRSA infection nor to guide or monitor treatment for MRSA infections. Performed at [facility], [place] [address], [place],  [address]      Time coordinating discharge: 40 minutes  SIGNED:   Elmarie Shiley, MD  [facility]

## 2020-01-15 NOTE — TOC Initial Note (Addendum)
Transition of Care [facility]) - Initial/Assessment Note    Patient Details  Name: Katherine Douglas MRN: [id number] Date of Birth: 1981/06/15  Transition of Care [facility]) CM/SW Contact:    Trish Mage, LCSW Phone Number: 01/15/2020, 9:35 AM  Clinical Narrative:   Ms Copple was seen in response to Dr consult re: mental health services.  She states she has been dealing with panic attacks since the age of 20, has seen numerous therapists for both individual and group therapy as well as MD's for medication intervention.  She lives here in [place] with her three children, the oldest is 37, and has good extended family support.  She wants to avoid future hospitalizations, and as a result is motivated to pick up working with someone again re: her anxiety and panic attacks. CSW will set her up with agency that can provide therapys, medication management and community support team. TOC will continue to follow during the course of hospitalization.  Addendum:  Patient referred to [facility] for psychiatry, therapy and CST.  They will reach out to patient to schedule initial appointment tomorrow after reviewing referral form filled out by CSW today.                Expected Discharge Plan: Home/Self Care Barriers to Discharge: No Barriers Identified   Patient Goals and CMS Choice Patient states their goals for this hospitalization and ongoing recovery are:: "I need help with panic attacks."      Expected Discharge Plan and Services Expected Discharge Plan: Home/Self Care In-house Referral: Clinical Social Work     Living arrangements for the past 2 months: Single Family Home                                      Prior Living Arrangements/Services Living arrangements for the past 2 months: Single Family Home Lives with:: Adult Children, Minor Children Patient language and need for interpreter reviewed:: Yes Do you feel safe going back to the place where you live?: Yes      Need  for Family Participation in Patient Care: Yes (Comment) Care giver support system in place?: Yes (comment)   Criminal Activity/Legal Involvement Pertinent to Current Situation/Hospitalization: No - Comment as needed  Activities of Daily Living Home Assistive Devices/Equipment: CBG Meter ADL Screening (condition at time of admission) Patient's cognitive ability adequate to safely complete daily activities?: Yes Is the patient deaf or have difficulty hearing?: No Does the patient have difficulty seeing, even when wearing glasses/contacts?: No Does the patient have difficulty concentrating, remembering, or making decisions?: No Patient able to express need for assistance with ADLs?: Yes Does the patient have difficulty dressing or bathing?: No Independently performs ADLs?: Yes (appropriate for developmental age) Does the patient have difficulty walking or climbing stairs?: No Weakness of Legs: None Weakness of Arms/Hands: None  Permission Sought/Granted                  Emotional Assessment Appearance:: Appears stated age Attitude/Demeanor/Rapport: Engaged Affect (typically observed): Appropriate Orientation: : Oriented to Self, Oriented to Place, Oriented to  Time, Oriented to Situation Alcohol / Substance Use: Not Applicable Psych Involvement: No (comment)  Admission diagnosis:  COPD exacerbation ([place]) [J44.1] Hypertensive urgency [I16.0] Asthma exacerbation [J45.901] Patient Active Problem List   Diagnosis Date Noted  . Hypertension complicating diabetes ([place]) 01/13/2020  . Leukocytosis 01/12/2020  . Hyperglycemia 01/12/2020  . Anxiety 01/12/2020  .  Asthma exacerbation 01/11/2020  . Bronchitis due to tobacco use 01/11/2020  . Tobacco abuse 01/11/2020  . Hypertensive urgency 01/11/2020  . Tachycardia 01/11/2020  . Morbid obesity ([place]) 07/05/2017  . Multiparity 04/14/2015   PCP:  Trey Sailors, PA Pharmacy:   [facility], [place] - [address] AT [place] [address] Phone: [phone] Fax: [phone]     Social Determinants of Health (SDOH) Interventions    Readmission Risk Interventions No flowsheet data found.

## 2020-01-15 NOTE — Progress Notes (Signed)
Provided patient w/ discharge education. Pt verbalized understanding.

## 2020-01-15 NOTE — Progress Notes (Signed)
PT Cancellation Note  Patient Details Name: Katherine Douglas MRN: [id number] DOB: [birth year]   Cancelled Treatment:     attempted to see x 2 1. ECHO 2. Phych  Pt was evaluated by Physical Therapy with no post acute follow up needed.   Rica Koyanagi  PTA Acute  Rehabilitation Services Pager      [phone] Office      [phone]

## 2020-02-18 ENCOUNTER — Encounter: Payer: Medicaid Other | Attending: Internal Medicine | Admitting: Dietician

## 2020-02-18 ENCOUNTER — Other Ambulatory Visit: Payer: Self-pay

## 2020-02-20 ENCOUNTER — Other Ambulatory Visit: Payer: Self-pay | Admitting: Physician Assistant

## 2020-02-20 DIAGNOSIS — Z1231 Encounter for screening mammogram for malignant neoplasm of breast: Secondary | ICD-10-CM

## 2020-02-26 ENCOUNTER — Encounter: Payer: Medicaid Other | Attending: Internal Medicine | Admitting: Skilled Nursing Facility1

## 2020-04-02 ENCOUNTER — Ambulatory Visit: Payer: Medicaid Other | Admitting: Dietician

## 2020-05-19 ENCOUNTER — Ambulatory Visit: Payer: Medicaid Other | Admitting: Dietician

## 2020-05-30 ENCOUNTER — Other Ambulatory Visit: Payer: Self-pay

## 2020-05-30 ENCOUNTER — Encounter (HOSPITAL_COMMUNITY): Payer: Self-pay | Admitting: Emergency Medicine

## 2020-05-30 ENCOUNTER — Ambulatory Visit (HOSPITAL_COMMUNITY)
Admission: EM | Admit: 2020-05-30 | Discharge: 2020-05-30 | Disposition: A | Discharge status: Home / Self Care | Payer: Medicaid Other | Attending: Emergency Medicine | Admitting: Emergency Medicine

## 2020-05-30 DIAGNOSIS — Z3202 Encounter for pregnancy test, result negative: Secondary | ICD-10-CM

## 2020-05-30 DIAGNOSIS — K5901 Slow transit constipation: Secondary | ICD-10-CM | POA: Insufficient documentation

## 2020-05-30 DIAGNOSIS — R109 Unspecified abdominal pain: Secondary | ICD-10-CM

## 2020-05-30 DIAGNOSIS — N390 Urinary tract infection, site not specified: Secondary | ICD-10-CM | POA: Insufficient documentation

## 2020-05-30 LAB — POCT URINALYSIS DIPSTICK, ED / UC
Bilirubin Urine: NEGATIVE
Glucose, UA: NEGATIVE mg/dL
Ketones, ur: NEGATIVE mg/dL
Nitrite: POSITIVE — AB
Protein, ur: NEGATIVE mg/dL
Specific Gravity, Urine: 1.025 (ref 1.005–1.030)
Urobilinogen, UA: 0.2 mg/dL (ref 0.0–1.0)
pH: 6.5 (ref 5.0–8.0)

## 2020-05-30 LAB — POC URINE PREG, ED: Preg Test, Ur: NEGATIVE

## 2020-05-30 MED ORDER — NITROFURANTOIN MONOHYD MACRO 100 MG PO CAPS: 100.0000 mg | ORAL_CAPSULE | Freq: Two times a day (BID) | ORAL | 0 refills | Status: AC

## 2020-05-30 MED ORDER — POLYETHYLENE GLYCOL 3350 17 GM/SCOOP PO POWD: 17.0000 g | Freq: Every day | ORAL | 0 refills | Status: DC

## 2020-05-30 NOTE — ED Provider Notes (Signed)
[facility]    CSN: [id number] Arrival date & time: 05/30/20  1512      History   Chief Complaint Chief Complaint  Patient presents with   Abdominal Pain    HPI Katherine Douglas is a 39 y.o. female.   Katherine Douglas presents with complaints of abdominal cramping which started a few weeks ago. Does have some intermittent cramping historically. Over the past week hasn't had a BM, either. Some discomfort with urination, this has resolved today however. She has had unprotected intercourse. No vaginal discharge. She has a nexplanon. Doesn't have regular periods. Left side feels "funny." some left low back pain. Took two laxatives which haven't helped. Eating makes abdomen more uncomfortable. No nausea or vomiting.    ROS per HPI, negative if not otherwise mentioned.      Past Medical History:  Diagnosis Date   Asthma    Depression    Diabetes mellitus without complication ([place])    pt states was gestational with previous pregnancy   Headache    Hypertension    Panic attacks    Pneumonia    Stress     Patient Active Problem List   Diagnosis Date Noted   Hypertension complicating diabetes ([place]) 01/13/2020   Leukocytosis 01/12/2020   Hyperglycemia 01/12/2020   Anxiety 01/12/2020   Asthma exacerbation 01/11/2020   Bronchitis due to tobacco use 01/11/2020   Tobacco abuse 01/11/2020   Hypertensive urgency 01/11/2020   Tachycardia 01/11/2020   Morbid obesity ([place]) 07/05/2017   Multiparity 04/14/2015    Past Surgical History:  Procedure Laterality Date   INDUCED ABORTION      OB History    Gravida  5   Para  3   Term  3   Preterm      AB  2   Living  3     SAB      TAB  2   Ectopic      Multiple  0   Live Births  3            Home Medications    Prior to Admission medications   Medication Sig Start Date End Date Taking? Authorizing Provider  albuterol (VENTOLIN HFA) 108 (90 Base) MCG/ACT inhaler  Inhale 4 puffs into the lungs every 6 (six) hours as needed for wheezing or shortness of breath. 01/15/20   Regalado, Belkys A, MD  amLODipine (NORVASC) 10 MG tablet Take 1 tablet (10 mg total) by mouth daily. 01/16/20   Regalado, Belkys A, MD  atenolol-chlorthalidone (TENORETIC) 50-25 MG tablet Take 1 tablet by mouth daily. 02/26/20   [provider]  azithromycin (ZITHROMAX) 250 MG tablet Take 1 tablet (250 mg total) by mouth daily. 01/15/20   Regalado, Belkys A, MD  benzonatate (TESSALON) 100 MG capsule Take 1 capsule (100 mg total) by mouth 3 (three) times daily as needed for cough. 01/15/20   Regalado, Belkys A, MD  etonogestrel (NEXPLANON) 68 MG IMPL implant 1 each by Subdermal route once.    [provider]  FLUoxetine (PROZAC) 10 MG capsule Take 1 capsule (10 mg total) by mouth daily. 01/15/20   Regalado, Belkys A, MD  lisinopril (ZESTRIL) 20 MG tablet Take 1 tablet (20 mg total) by mouth daily. 01/16/20   Regalado, Belkys A, MD  meloxicam (MOBIC) 15 MG tablet Take 15 mg by mouth daily. 09/03/19   [provider]  mometasone-formoterol (DULERA) 200-5 MCG/ACT AERO Inhale 1 puff into the lungs 2 (two) times  daily. 01/15/20   Regalado, Belkys A, MD  nicotine (NICODERM CQ - DOSED IN MG/24 HOURS) 21 mg/24hr patch Place 1 patch (21 mg total) onto the skin daily. 01/16/20   Regalado, Belkys A, MD  nitrofurantoin, macrocrystal-monohydrate, (MACROBID) 100 MG capsule Take 1 capsule (100 mg total) by mouth 2 (two) times daily for 5 days. 05/30/20 06/04/20  Augusto Gamble B, NP  polyethylene glycol powder (GLYCOLAX/MIRALAX) 17 GM/SCOOP powder Take 17 g by mouth daily. 05/30/20   Zigmund Gottron, NP    Family History Family History  Problem Relation Age of Onset   Hypertension Mother    Heart failure Mother    Diabetes Father    CAD Other     Social History Social History   Tobacco Use   Smoking status: Current Every Day Smoker    Packs/day: 0.25    Years: 18.00    Pack  years: 4.50    Types: Cigarettes    Last attempt to quit: 09/15/2014    Years since quitting: 5.7   Smokeless tobacco: Never Used  Vaping Use   Vaping Use: Never used  Substance Use Topics   Alcohol use: Not Currently   Drug use: No    Frequency: 4.0 times per week     Allergies   Other, Amoxicillin, Peanut-containing drug products, Penicillins, and Strawberry extract   Review of Systems Review of Systems   Physical Exam Triage Vital Signs ED Triage Vitals  Enc Vitals Group     BP 05/30/20 1757 133/85     Pulse Rate 05/30/20 1757 95     Resp 05/30/20 1757 (!) 22     Temp 05/30/20 1757 98.9 F (37.2 C)     Temp Source 05/30/20 1757 Oral     SpO2 05/30/20 1757 99 %     Weight --      Height --      Head Circumference --      Peak Flow --      Pain Score 05/30/20 1754 7     Pain Loc --      Pain Edu? --      Excl. in [place]? --    No data found.  Updated Vital Signs BP 133/85 (BP Location: Left Arm) Comment (BP Location): large cuff to forearm   Pulse 95    Temp 98.9 F (37.2 C) (Oral)    Resp (!) 22    SpO2 99%   Visual Acuity Right Eye Distance:   Left Eye Distance:   Bilateral Distance:    Right Eye Near:   Left Eye Near:    Bilateral Near:     Physical Exam Constitutional:      General: She is not in acute distress.    Appearance: She is well-developed. She is obese.  Cardiovascular:     Rate and Rhythm: Normal rate.  Pulmonary:     Effort: Pulmonary effort is normal.  Abdominal:     Tenderness: There is no abdominal tenderness.  Skin:    General: Skin is warm and dry.  Neurological:     Mental Status: She is alert and oriented to person, place, and time.      UC Treatments / Results  Labs (all labs ordered are listed, but only abnormal results are displayed) Labs Reviewed  POCT URINALYSIS DIPSTICK, ED / UC - Abnormal; Notable for the following components:      Result Value   Hgb urine dipstick TRACE (*)    Nitrite POSITIVE (*)  Leukocytes,Ua TRACE (*)    All other components within normal limits  URINE CULTURE  POC URINE PREG, ED  CERVICOVAGINAL ANCILLARY ONLY    EKG   Radiology No results found.  Procedures Procedures (including critical care time)  Medications Ordered in UC Medications - No data to display  Initial Impression / Assessment and Plan / UC Course  I have reviewed the triage vital signs and the nursing notes.  Pertinent labs & imaging results that were available during my care of the patient were reviewed by me and considered in my medical decision making (see chart for details).     Benign abdominal exam here today. Afebrile. Urine is consistent with uti with positive nitrite with antibiotics initiated and culture pending. Vaginal cytology collected and pending as well. Constipation treatment and prevention discussed. Return precautions provided.  Patient verbalized understanding and agreeable to plan.   Final Clinical Impressions(s) / UC Diagnoses   Final diagnoses:  Slow transit constipation  Lower urinary tract infectious disease     Discharge Instructions     Your urine is consistent with UTI.  Complete course of antibiotics.  Drink plenty of water to empty bladder regularly. Avoid alcohol and caffeine as these may irritate the bladder.   Two doses (1 scoop = 1 dose) of miralax, a few hours apart, two doses tomorrow- 1 in the morning and 1 in the evening, then daily until having regular bowel movements.  We will notify of you any positive findings from your vaginal testing or if any changes to treatment are needed. If normal or otherwise without concern to your results, we will not call you. Please log on to your MyChart to review your results if interested in so.      ED Prescriptions    Medication Sig Dispense Auth. Provider   nitrofurantoin, macrocrystal-monohydrate, (MACROBID) 100 MG capsule Take 1 capsule (100 mg total) by mouth 2 (two) times daily for 5 days. 10  capsule Augusto Gamble B, NP   polyethylene glycol powder (GLYCOLAX/MIRALAX) 17 GM/SCOOP powder Take 17 g by mouth daily. 255 g Zigmund Gottron, NP     PDMP not reviewed this encounter.   Zigmund Gottron, NP 05/31/20 [phone]

## 2020-05-30 NOTE — Discharge Instructions (Signed)
Your urine is consistent with UTI.  Complete course of antibiotics.  Drink plenty of water to empty bladder regularly. Avoid alcohol and caffeine as these may irritate the bladder.   Two doses (1 scoop = 1 dose) of miralax, a few hours apart, two doses tomorrow- 1 in the morning and 1 in the evening, then daily until having regular bowel movements.  We will notify of you any positive findings from your vaginal testing or if any changes to treatment are needed. If normal or otherwise without concern to your results, we will not call you. Please log on to your MyChart to review your results if interested in so.

## 2020-05-30 NOTE — ED Triage Notes (Signed)
Abdominal pain 3 weeks ago.  Pain has gradually worsened.  Last week thought she was constipated, took a laxative with no relief.  No has left flank pain.  Solid food makes stomach hurts worse.   Denies vaginal discharge.

## 2020-06-01 LAB — URINE CULTURE

## 2020-06-03 LAB — CERVICOVAGINAL ANCILLARY ONLY
Bacterial Vaginitis (gardnerella): POSITIVE — AB
Candida Glabrata: NEGATIVE
Candida Vaginitis: NEGATIVE
Chlamydia: NEGATIVE
Comment: NEGATIVE
Comment: NEGATIVE
Comment: NEGATIVE
Comment: NEGATIVE
Comment: NEGATIVE
Comment: NORMAL
Neisseria Gonorrhea: NEGATIVE
Trichomonas: NEGATIVE

## 2020-06-09 ENCOUNTER — Ambulatory Visit: Payer: Medicaid Other | Admitting: Obstetrics

## 2021-01-29 NOTE — Progress Notes (Deleted)
02/01/21- 75 yoF Smoker for sleep evaluation courtesy of Raelyn Number, PA with concern of OSA Medical problem list includes HTN, COPD/Asthmatic Bronchitis, Tobacco Abuse, DM2, Morbid Obesity,  Medication includes Ventolin hfa, Dulera 200, Nicoderm 21 mg,  Epworth score- Body weight today- Covid vax-   CXR 1V 01/13/20 IMPRESSION: No active disease.

## 2021-02-01 ENCOUNTER — Institutional Professional Consult (permissible substitution): Payer: Medicaid Other | Admitting: Internal Medicine

## 2021-04-04 NOTE — Progress Notes (Deleted)
04/05/21- 34 yoF Former smoker for sleep evaluation courtesy of Herminio Heads, NP with concern of  OSA Medical problem list includes HTN, Asthmatic Bronchitis, Panic Attacks, Morbid Obesity,  Epworth score- Body weight today- Covid vax-

## 2021-04-05 ENCOUNTER — Institutional Professional Consult (permissible substitution): Payer: Medicaid Other | Admitting: Internal Medicine

## 2021-12-07 ENCOUNTER — Institutional Professional Consult (permissible substitution): Payer: Medicaid Other | Admitting: Plastic Surgery

## 2021-12-17 ENCOUNTER — Emergency Department (HOSPITAL_COMMUNITY)
Admission: EM | Admit: 2021-12-17 | Discharge: 2021-12-17 | Disposition: A | Discharge status: Home / Self Care | Payer: Medicaid Other | Attending: Emergency Medicine | Admitting: Emergency Medicine

## 2021-12-17 ENCOUNTER — Other Ambulatory Visit: Payer: Self-pay

## 2021-12-17 ENCOUNTER — Encounter (HOSPITAL_COMMUNITY): Payer: Self-pay

## 2021-12-17 ENCOUNTER — Emergency Department (HOSPITAL_COMMUNITY): Payer: Medicaid Other

## 2021-12-17 DIAGNOSIS — J0100 Acute maxillary sinusitis, unspecified: Secondary | ICD-10-CM | POA: Insufficient documentation

## 2021-12-17 DIAGNOSIS — Z91010 Allergy to peanuts: Secondary | ICD-10-CM | POA: Insufficient documentation

## 2021-12-17 DIAGNOSIS — I10 Essential (primary) hypertension: Secondary | ICD-10-CM | POA: Insufficient documentation

## 2021-12-17 DIAGNOSIS — R0981 Nasal congestion: Secondary | ICD-10-CM | POA: Diagnosis present

## 2021-12-17 DIAGNOSIS — E1165 Type 2 diabetes mellitus with hyperglycemia: Secondary | ICD-10-CM | POA: Diagnosis not present

## 2021-12-17 DIAGNOSIS — Z79899 Other long term (current) drug therapy: Secondary | ICD-10-CM | POA: Diagnosis not present

## 2021-12-17 DIAGNOSIS — R42 Dizziness and giddiness: Secondary | ICD-10-CM | POA: Insufficient documentation

## 2021-12-17 LAB — CBC WITH DIFFERENTIAL/PLATELET
Abs Immature Granulocytes: 0.04 10*3/uL (ref 0.00–0.07)
Basophils Absolute: 0.1 10*3/uL (ref 0.0–0.1)
Basophils Relative: 1 %
Eosinophils Absolute: 0.3 10*3/uL (ref 0.0–0.5)
Eosinophils Relative: 3 %
HCT: 43.6 % (ref 36.0–46.0)
Hemoglobin: 13.5 g/dL (ref 12.0–15.0)
Immature Granulocytes: 0 %
Lymphocytes Relative: 29 %
Lymphs Abs: 3 10*3/uL (ref 0.7–4.0)
MCH: 27.4 pg (ref 26.0–34.0)
MCHC: 31 g/dL (ref 30.0–36.0)
MCV: 88.6 fL (ref 80.0–100.0)
Monocytes Absolute: 0.5 10*3/uL (ref 0.1–1.0)
Monocytes Relative: 5 %
Neutro Abs: 6.4 10*3/uL (ref 1.7–7.7)
Neutrophils Relative %: 62 %
Platelets: 421 10*3/uL — ABNORMAL HIGH (ref 150–400)
RBC: 4.92 MIL/uL (ref 3.87–5.11)
RDW: 14.1 % (ref 11.5–15.5)
WBC: 10.4 10*3/uL (ref 4.0–10.5)
nRBC: 0 % (ref 0.0–0.2)

## 2021-12-17 LAB — BASIC METABOLIC PANEL
Anion gap: 9 (ref 5–15)
BUN: 11 mg/dL (ref 6–20)
CO2: 27 mmol/L (ref 22–32)
Calcium: 8.6 mg/dL — ABNORMAL LOW (ref 8.9–10.3)
Chloride: 98 mmol/L (ref 98–111)
Creatinine, Ser: 0.82 mg/dL (ref 0.44–1.00)
GFR, Estimated: >60 mL/min (ref >60)
Glucose, Bld: 316 mg/dL — ABNORMAL HIGH (ref 70–99)
Potassium: 3.9 mmol/L (ref 3.5–5.1)
Sodium: 134 mmol/L — ABNORMAL LOW (ref 135–145)

## 2021-12-17 MED ORDER — CHLORTHALIDONE 25 MG PO TABS: 25.0000 mg | ORAL_TABLET | Freq: Every day | ORAL | Status: DC

## 2021-12-17 MED ORDER — HYDROCODONE-ACETAMINOPHEN 5-325 MG PO TABS
1.0000 | ORAL_TABLET | Freq: Once | ORAL | Status: DC
  Filled 2021-12-17: qty 1

## 2021-12-17 MED ORDER — OXYCODONE HCL 5 MG PO TABS
5.0000 mg | ORAL_TABLET | Freq: Once | ORAL | Status: AC
  Administered 2021-12-17: 5 mg via ORAL
  Filled 2021-12-17: qty 1

## 2021-12-17 MED ORDER — CHLORTHALIDONE 25 MG PO TABS
25.0000 mg | ORAL_TABLET | ORAL | Status: AC
  Administered 2021-12-17: 25 mg via ORAL
  Filled 2021-12-17: qty 1

## 2021-12-17 MED ORDER — SULFAMETHOXAZOLE-TRIMETHOPRIM 800-160 MG PO TABS: 1.0000 | ORAL_TABLET | Freq: Two times a day (BID) | ORAL | 0 refills | Status: AC

## 2021-12-17 MED ORDER — LISINOPRIL 10 MG PO TABS
20.0000 mg | ORAL_TABLET | Freq: Once | ORAL | Status: AC
  Administered 2021-12-17: 20 mg via ORAL
  Filled 2021-12-17: qty 2

## 2021-12-17 MED ORDER — ATENOLOL 50 MG PO TABS
50.0000 mg | ORAL_TABLET | ORAL | Status: AC
  Administered 2021-12-17: 50 mg via ORAL
  Filled 2021-12-17: qty 1

## 2021-12-17 NOTE — ED Notes (Signed)
ED Provider at bedside. 

## 2021-12-17 NOTE — ED Provider Notes (Signed)
?[facility] DEPT ?Provider Note ? ? ?CSN: [id number] ?Arrival date & time: 12/17/21  1000 ? ?  ? ?History ? ?Chief Complaint  ?Patient presents with  ? Cough  ? eye sight issue  ? Nasal Congestion  ? Headache  ? ear popping  ? ? ?Katherine Douglas is a 41 y.o. female. ? ?Patient complains of sinus congestion and cough for a week.  She also complains of having some lightheadedness and difficulty vision.  She has a history of hypertension and diabetes. ? ?The history is provided by the patient and medical records. No language interpreter was used.  ?Cough ?Cough characteristics:  Non-productive ?Sputum characteristics:  Nondescript ?Severity:  Moderate ?Onset quality:  Sudden ?Timing:  Constant ?Progression:  Worsening ?Chronicity:  Recurrent ?Smoker: no   ?Context: not animal exposure   ?Relieved by:  Nothing ?Associated symptoms: headaches   ?Associated symptoms: no chest pain, no eye discharge and no rash   ?Headache ?Associated symptoms: cough   ?Associated symptoms: no abdominal pain, no back pain, no congestion, no diarrhea, no fatigue, no seizures and no sinus pressure   ? ?  ? ?Home Medications ?Prior to Admission medications   ?Medication Sig Start Date End Date Taking? Authorizing Provider  ?sulfamethoxazole-trimethoprim (BACTRIM DS) 800-160 MG tablet Take 1 tablet by mouth 2 (two) times daily for 7 days. 12/17/21 12/24/21 Yes Milton Ferguson, MD  ?albuterol (VENTOLIN HFA) 108 (90 Base) MCG/ACT inhaler Inhale 4 puffs into the lungs every 6 (six) hours as needed for wheezing or shortness of breath. 01/15/20   Regalado, Belkys A, MD  ?amLODipine (NORVASC) 10 MG tablet Take 1 tablet (10 mg total) by mouth daily. 01/16/20   Regalado, Belkys A, MD  ?atenolol-chlorthalidone (TENORETIC) 50-25 MG tablet Take 1 tablet by mouth daily. 02/26/20   [provider]  ?azithromycin (ZITHROMAX) 250 MG tablet Take 1 tablet (250 mg total) by mouth daily. 01/15/20   Regalado, Belkys A, MD   ?benzonatate (TESSALON) 100 MG capsule Take 1 capsule (100 mg total) by mouth 3 (three) times daily as needed for cough. 01/15/20   Regalado, Belkys A, MD  ?etonogestrel (NEXPLANON) 68 MG IMPL implant 1 each by Subdermal route once.    [provider]  ?FLUoxetine (PROZAC) 10 MG capsule Take 1 capsule (10 mg total) by mouth daily. 01/15/20   Regalado, Belkys A, MD  ?lisinopril (ZESTRIL) 20 MG tablet Take 1 tablet (20 mg total) by mouth daily. 01/16/20   Regalado, Belkys A, MD  ?meloxicam (MOBIC) 15 MG tablet Take 15 mg by mouth daily. 09/03/19   [provider]  ?mometasone-formoterol (DULERA) 200-5 MCG/ACT AERO Inhale 1 puff into the lungs 2 (two) times daily. 01/15/20   Regalado, Belkys A, MD  ?nicotine (NICODERM CQ - DOSED IN MG/24 HOURS) 21 mg/24hr patch Place 1 patch (21 mg total) onto the skin daily. 01/16/20   Regalado, Belkys A, MD  ?polyethylene glycol powder (GLYCOLAX/MIRALAX) 17 GM/SCOOP powder Take 17 g by mouth daily. 05/30/20   Zigmund Gottron, NP  ?   ? ?Allergies    ?Other, Amoxicillin, Peanut-containing drug products, Penicillins, and Strawberry extract   ? ?Review of Systems   ?Review of Systems  ?Constitutional:  Negative for appetite change and fatigue.  ?HENT:  Negative for congestion, ear discharge and sinus pressure.   ?Eyes:  Negative for discharge.  ?Respiratory:  Positive for cough.   ?Cardiovascular:  Negative for chest pain.  ?Gastrointestinal:  Negative for abdominal pain and diarrhea.  ?Genitourinary:  Negative for frequency and hematuria.  ?Musculoskeletal:  Negative for back pain.  ?Skin:  Negative for rash.  ?Neurological:  Positive for headaches. Negative for seizures.  ?Psychiatric/Behavioral:  Negative for hallucinations.   ? ?Physical Exam ?Updated Vital Signs ?BP 132/81   Pulse 83   Temp 98.2 ?F (36.8 ?C) (Oral)   Resp 17   Ht '5\' 2"'$  (1.575 m)   Wt 131.5 kg   SpO2 92%   BMI 53.04 kg/m?  ?Physical Exam ?Vitals and nursing note reviewed.  ?Constitutional:   ?    Appearance: She is well-developed.  ?HENT:  ?   Head: Normocephalic.  ?   Nose: Nose normal.  ?Eyes:  ?   General: No scleral icterus. ?   Conjunctiva/sclera: Conjunctivae normal.  ?Neck:  ?   Thyroid: No thyromegaly.  ?Cardiovascular:  ?   Rate and Rhythm: Normal rate and regular rhythm.  ?   Heart sounds: No murmur heard. ?  No friction rub. No gallop.  ?Pulmonary:  ?   Breath sounds: No stridor. No wheezing or rales.  ?Chest:  ?   Chest wall: No tenderness.  ?Abdominal:  ?   General: There is no distension.  ?   Tenderness: There is no abdominal tenderness. There is no rebound.  ?Musculoskeletal:     ?   General: Normal range of motion.  ?   Cervical back: Neck supple.  ?Lymphadenopathy:  ?   Cervical: No cervical adenopathy.  ?Skin: ?   Findings: No erythema or rash.  ?Neurological:  ?   Mental Status: She is alert and oriented to person, place, and time.  ?   Motor: No abnormal muscle tone.  ?   Coordination: Coordination normal.  ?Psychiatric:     ?   Behavior: Behavior normal.  ? ? ?ED Results / Procedures / Treatments   ?Labs ?(all labs ordered are listed, but only abnormal results are displayed) ?Labs Reviewed  ?CBC WITH DIFFERENTIAL/PLATELET - Abnormal; Notable for the following components:  ?    Result Value  ? Platelets 421 (*)   ? All other components within normal limits  ?BASIC METABOLIC PANEL - Abnormal; Notable for the following components:  ? Sodium 134 (*)   ? Glucose, Bld 316 (*)   ? Calcium 8.6 (*)   ? All other components within normal limits  ? ? ?EKG ?None ? ?Radiology ?CT Head Wo Contrast ? ?Result Date: 12/17/2021 ?CLINICAL DATA:  41 year old female with dizziness and headache EXAM: CT HEAD WITHOUT CONTRAST TECHNIQUE: Contiguous axial images were obtained from the base of the skull through the vertex without intravenous contrast. RADIATION DOSE REDUCTION: This exam was performed according to the departmental dose-optimization program which includes automated exposure control, adjustment of  the mA and/or kV according to patient size and/or use of iterative reconstruction technique. COMPARISON:  None. FINDINGS: Brain: No acute intracranial hemorrhage. No midline shift or mass effect. Gray-white differentiation maintained. Unremarkable appearance of the ventricular system. Empty sella configuration Vascular: Unremarkable. Skull: No acute fracture.  No aggressive bone lesion identified. Sinuses/Orbits: Unremarkable appearance of the orbits. Mastoid air cells clear. No middle ear effusion. No significant sinus disease. Other: None IMPRESSION: Negative head CT Electronically Signed   By: Corrie Mckusick D.O.   On: 12/17/2021 11:39   ? ?Procedures ?Procedures  ? ? ?Medications Ordered in ED ?Medications  ?chlorthalidone (HYGROTON) tablet 25 mg (has no administration in time range)  ?lisinopril (ZESTRIL) tablet 20 mg (20 mg Oral Given 12/17/21 1113)  ?atenolol (  TENORMIN) tablet 50 mg (50 mg Oral Given 12/17/21 1103)  ?chlorthalidone (HYGROTON) tablet 25 mg (25 mg Oral Given 12/17/21 1113)  ?oxyCODONE (Oxy IR/ROXICODONE) immediate release tablet 5 mg (5 mg Oral Given 12/17/21 1113)  ? ? ?ED Course/ Medical Decision Making/ A&P ?  ?                        ?Medical Decision Making ?Amount and/or Complexity of Data Reviewed ?Labs: ordered. ?Radiology: ordered. ? ?Risk ?Prescription drug management. ? ?This patient presents to the ED for concern of cough, this involves an extensive number of treatment options, and is a complaint that carries with it a high risk of complications and morbidity.  The differential diagnosis includes pneumonia, heart failure ? ? ?Co morbidities that complicate the patient evaluation ? ?Hypertension diabetes ? ? ?Additional history obtained: ? ?Additional history obtained from patient ?External records from outside source obtained and reviewed including hospital record ? ? ?Lab Tests: ? ?I Ordered, and personally interpreted labs.  The pertinent results include: CBC and chemistries which  showed elevated glucose at 360 ? ? ?Imaging Studies ordered: ? ?I ordered imaging studies including CT head ?I independently visualized and interpreted imaging which showed unremarkable ?I agree with the radiologist inte

## 2021-12-17 NOTE — ED Notes (Signed)
Patient transported to CT 

## 2021-12-17 NOTE — Discharge Instructions (Signed)
Follow-up with your family doctor next week to recheck your blood pressure and adjust any medicines if necessary.  Also the need to recheck your sugar and make adjustments there to ?

## 2021-12-17 NOTE — ED Triage Notes (Addendum)
Patient states her main problems is she has issues of not being able to see when she is stressed or having a headache x 1 month. ?Patient also c/o a productive cough with green sputum x 1 week.  ?Patient also c/o "ears popping" ? ?BP in triage 158/113. Patient did not take her BP med today. ?

## 2022-02-11 ENCOUNTER — Institutional Professional Consult (permissible substitution): Payer: Medicaid Other | Admitting: Plastic Surgery

## 2022-03-08 ENCOUNTER — Institutional Professional Consult (permissible substitution): Payer: Medicaid Other | Admitting: Plastic Surgery

## 2022-05-10 ENCOUNTER — Institutional Professional Consult (permissible substitution): Payer: Medicaid Other | Admitting: Plastic Surgery

## 2022-07-26 ENCOUNTER — Institutional Professional Consult (permissible substitution): Payer: Medicaid Other | Admitting: Plastic Surgery

## 2022-09-06 ENCOUNTER — Encounter (HOSPITAL_COMMUNITY): Payer: Self-pay | Admitting: Emergency Medicine

## 2022-09-06 ENCOUNTER — Emergency Department (HOSPITAL_COMMUNITY): Payer: Medicaid Other

## 2022-09-06 ENCOUNTER — Other Ambulatory Visit: Payer: Self-pay

## 2022-09-06 ENCOUNTER — Inpatient Hospital Stay (HOSPITAL_COMMUNITY)
Admission: EM | Admit: 2022-09-06 | Discharge: 2022-09-10 | DRG: 190 | Disposition: A | Discharge status: Home / Self Care | Payer: Medicaid Other | Attending: Internal Medicine | Admitting: Internal Medicine

## 2022-09-06 DIAGNOSIS — Z7951 Long term (current) use of inhaled steroids: Secondary | ICD-10-CM

## 2022-09-06 DIAGNOSIS — Z79899 Other long term (current) drug therapy: Secondary | ICD-10-CM

## 2022-09-06 DIAGNOSIS — R0600 Dyspnea, unspecified: Principal | ICD-10-CM

## 2022-09-06 DIAGNOSIS — J441 Chronic obstructive pulmonary disease with (acute) exacerbation: Principal | ICD-10-CM | POA: Diagnosis present

## 2022-09-06 DIAGNOSIS — Z23 Encounter for immunization: Secondary | ICD-10-CM

## 2022-09-06 DIAGNOSIS — Z793 Long term (current) use of hormonal contraceptives: Secondary | ICD-10-CM

## 2022-09-06 DIAGNOSIS — Z20822 Contact with and (suspected) exposure to covid-19: Secondary | ICD-10-CM | POA: Diagnosis present

## 2022-09-06 DIAGNOSIS — I10 Essential (primary) hypertension: Secondary | ICD-10-CM | POA: Diagnosis present

## 2022-09-06 DIAGNOSIS — Z791 Long term (current) use of non-steroidal anti-inflammatories (NSAID): Secondary | ICD-10-CM

## 2022-09-06 DIAGNOSIS — Z91013 Allergy to seafood: Secondary | ICD-10-CM

## 2022-09-06 DIAGNOSIS — F419 Anxiety disorder, unspecified: Secondary | ICD-10-CM

## 2022-09-06 DIAGNOSIS — T380X5A Adverse effect of glucocorticoids and synthetic analogues, initial encounter: Secondary | ICD-10-CM | POA: Diagnosis not present

## 2022-09-06 DIAGNOSIS — F17210 Nicotine dependence, cigarettes, uncomplicated: Secondary | ICD-10-CM | POA: Diagnosis present

## 2022-09-06 DIAGNOSIS — Z91018 Allergy to other foods: Secondary | ICD-10-CM

## 2022-09-06 DIAGNOSIS — J9601 Acute respiratory failure with hypoxia: Secondary | ICD-10-CM | POA: Diagnosis present

## 2022-09-06 DIAGNOSIS — Z91010 Allergy to peanuts: Secondary | ICD-10-CM

## 2022-09-06 DIAGNOSIS — E1165 Type 2 diabetes mellitus with hyperglycemia: Secondary | ICD-10-CM | POA: Diagnosis not present

## 2022-09-06 DIAGNOSIS — F32A Depression, unspecified: Secondary | ICD-10-CM | POA: Diagnosis present

## 2022-09-06 DIAGNOSIS — Z720 Tobacco use: Secondary | ICD-10-CM | POA: Diagnosis present

## 2022-09-06 DIAGNOSIS — E119 Type 2 diabetes mellitus without complications: Secondary | ICD-10-CM | POA: Diagnosis present

## 2022-09-06 DIAGNOSIS — Z833 Family history of diabetes mellitus: Secondary | ICD-10-CM

## 2022-09-06 DIAGNOSIS — F410 Panic disorder [episodic paroxysmal anxiety] without agoraphobia: Secondary | ICD-10-CM | POA: Diagnosis present

## 2022-09-06 DIAGNOSIS — Z880 Allergy status to penicillin: Secondary | ICD-10-CM

## 2022-09-06 DIAGNOSIS — D72829 Elevated white blood cell count, unspecified: Secondary | ICD-10-CM | POA: Diagnosis present

## 2022-09-06 DIAGNOSIS — Z6841 Body Mass Index (BMI) 40.0 and over, adult: Secondary | ICD-10-CM

## 2022-09-06 DIAGNOSIS — F1210 Cannabis abuse, uncomplicated: Secondary | ICD-10-CM | POA: Diagnosis present

## 2022-09-06 DIAGNOSIS — R9431 Abnormal electrocardiogram [ECG] [EKG]: Secondary | ICD-10-CM | POA: Diagnosis present

## 2022-09-06 DIAGNOSIS — Z8701 Personal history of pneumonia (recurrent): Secondary | ICD-10-CM

## 2022-09-06 DIAGNOSIS — R000 Tachycardia, unspecified: Secondary | ICD-10-CM | POA: Diagnosis present

## 2022-09-06 DIAGNOSIS — E1169 Type 2 diabetes mellitus with other specified complication: Secondary | ICD-10-CM

## 2022-09-06 DIAGNOSIS — E8729 Other acidosis: Secondary | ICD-10-CM | POA: Diagnosis present

## 2022-09-06 DIAGNOSIS — Z8249 Family history of ischemic heart disease and other diseases of the circulatory system: Secondary | ICD-10-CM

## 2022-09-06 LAB — CBC WITH DIFFERENTIAL/PLATELET
Abs Immature Granulocytes: 0.09 10*3/uL — ABNORMAL HIGH (ref 0.00–0.07)
Basophils Absolute: 0.1 10*3/uL (ref 0.0–0.1)
Basophils Relative: 1 %
Eosinophils Absolute: 0.4 10*3/uL (ref 0.0–0.5)
Eosinophils Relative: 3 %
HCT: 42.1 % (ref 36.0–46.0)
Hemoglobin: 12.8 g/dL (ref 12.0–15.0)
Immature Granulocytes: 1 %
Lymphocytes Relative: 20 %
Lymphs Abs: 2.7 10*3/uL (ref 0.7–4.0)
MCH: 26.1 pg (ref 26.0–34.0)
MCHC: 30.4 g/dL (ref 30.0–36.0)
MCV: 85.9 fL (ref 80.0–100.0)
Monocytes Absolute: 1.5 10*3/uL — ABNORMAL HIGH (ref 0.1–1.0)
Monocytes Relative: 11 %
Neutro Abs: 8.5 10*3/uL — ABNORMAL HIGH (ref 1.7–7.7)
Neutrophils Relative %: 64 %
Platelets: 461 10*3/uL — ABNORMAL HIGH (ref 150–400)
RBC: 4.9 MIL/uL (ref 3.87–5.11)
RDW: 15.5 % (ref 11.5–15.5)
WBC: 13.2 10*3/uL — ABNORMAL HIGH (ref 4.0–10.5)
nRBC: 0 % (ref 0.0–0.2)

## 2022-09-06 LAB — RESP PANEL BY RT-PCR (RSV, FLU A&B, COVID)  RVPGX2
Influenza A by PCR: NEGATIVE
Influenza B by PCR: NEGATIVE
Resp Syncytial Virus by PCR: NEGATIVE
SARS Coronavirus 2 by RT PCR: NEGATIVE

## 2022-09-06 LAB — BLOOD GAS, VENOUS
Acid-Base Excess: 9.6 mmol/L — ABNORMAL HIGH (ref 0.0–2.0)
Bicarbonate: 36.9 mmol/L — ABNORMAL HIGH (ref 20.0–28.0)
O2 Saturation: 46.8 %
Patient temperature: 37
pCO2, Ven: 61 mmHg — ABNORMAL HIGH (ref 44–60)
pH, Ven: 7.39 (ref 7.25–7.43)
pO2, Ven: 31 mmHg — CL (ref 32–45)

## 2022-09-06 LAB — COMPREHENSIVE METABOLIC PANEL
ALT: 33 U/L (ref 0–44)
AST: 35 U/L (ref 15–41)
Albumin: 3.4 g/dL — ABNORMAL LOW (ref 3.5–5.0)
Alkaline Phosphatase: 96 U/L (ref 38–126)
Anion gap: 9 (ref 5–15)
BUN: 8 mg/dL (ref 6–20)
CO2: 29 mmol/L (ref 22–32)
Calcium: 9.2 mg/dL (ref 8.9–10.3)
Chloride: 101 mmol/L (ref 98–111)
Creatinine, Ser: 0.72 mg/dL (ref 0.44–1.00)
GFR, Estimated: >60 mL/min (ref >60)
Glucose, Bld: 183 mg/dL — ABNORMAL HIGH (ref 70–99)
Potassium: 3.5 mmol/L (ref 3.5–5.1)
Sodium: 139 mmol/L (ref 135–145)
Total Bilirubin: 0.4 mg/dL (ref 0.3–1.2)
Total Protein: 7.4 g/dL (ref 6.5–8.1)

## 2022-09-06 LAB — BRAIN NATRIURETIC PEPTIDE: B Natriuretic Peptide: 8.7 pg/mL (ref 0.0–100.0)

## 2022-09-06 LAB — HCG, QUANTITATIVE, PREGNANCY: hCG, Beta Chain, Quant, S: <1 m[IU]/mL (ref <5)

## 2022-09-06 LAB — TROPONIN I (HIGH SENSITIVITY): Troponin I (High Sensitivity): 4 ng/L (ref <18)

## 2022-09-06 MED ORDER — METHYLPREDNISOLONE SODIUM SUCC 125 MG IJ SOLR
125.0000 mg | Freq: Once | INTRAMUSCULAR | Status: AC
  Administered 2022-09-07: 125 mg via INTRAVENOUS
  Filled 2022-09-06: qty 2

## 2022-09-06 MED ORDER — SODIUM CHLORIDE 0.9 % IV SOLN
500.0000 mg | INTRAVENOUS | Status: DC
  Administered 2022-09-07: 500 mg via INTRAVENOUS
  Filled 2022-09-06: qty 5

## 2022-09-06 MED ORDER — IPRATROPIUM-ALBUTEROL 0.5-2.5 (3) MG/3ML IN SOLN
3.0000 mL | Freq: Once | RESPIRATORY_TRACT | Status: AC
  Administered 2022-09-07: 3 mL via RESPIRATORY_TRACT
  Filled 2022-09-06: qty 3

## 2022-09-06 MED ORDER — SODIUM CHLORIDE 0.9 % IV SOLN
1.0000 g | INTRAVENOUS | Status: DC
  Administered 2022-09-07 – 2022-09-09 (×4): 1 g via INTRAVENOUS
  Filled 2022-09-06 (×4): qty 10

## 2022-09-06 NOTE — ED Provider Triage Note (Signed)
Emergency Medicine Provider Triage Evaluation Note  Katherine Douglas , a 41 y.o. female  was evaluated in triage.  Pt complains of asthma exacerbation.  Patient reports symptom began last night.  Patient states that she is having severe shortness of breath, rapid heart rate, chest pain.  Patient states history of the same which caused her to be admitted for 1 week.  Patient denies history of blood clots.  Patient denies nausea or vomiting or fevers.  Review of Systems  Positive:  Negative:   Physical Exam  BP (!) 136/111 (BP Location: Right Arm)   Pulse (!) 130   Temp 99.8 F (37.7 C) (Oral)   Resp 17   SpO2 91%  Gen:   Awake, no distress   Resp:  Normal effort  MSK:   Moves extremities without difficulty  Other:  Tachycardic and hypertensive in triage.  Lung sounds have wheezing throughout.  Normal neurological examination.  Medical Decision Making  Medically screening exam initiated at 9:50 PM.  Appropriate orders placed.  [name] was informed that the remainder of the evaluation will be completed by another provider, this initial triage assessment does not replace that evaluation, and the importance of remaining in the ED until their evaluation is complete.     Azucena Cecil, PA-C 09/06/22 2151

## 2022-09-06 NOTE — ED Triage Notes (Signed)
Pt arrives via EMS from home w/ c/o SHOB. Pt hx asthma. Pt given 0.3 epi, solumedrol, mag and a duoneb. Pt sats 84% RA upon EMS arrival. Pt reports she feels slightly better since given meds

## 2022-09-06 NOTE — ED Provider Notes (Signed)
[facility] DEPT Provider Note   CSN: [id number] Arrival date & time: 09/06/22  2112     History  Chief Complaint  Patient presents with   Shortness of [place] is a 41 y.o. female.  41 year old female with history of asthma presents with shortness of breath x 3 days.  She is able to cough as well.  Has been using her inhaler without relief.  Chills and rapid heart rate noted.  Also has had URI symptoms.  Similar symptoms 2 years ago which required a 1 week admission at that time.  Notes that her dyspnea on exertion has been getting worse.  Denies any vomiting or diarrhea.       Home Medications Prior to Admission medications   Medication Sig Start Date End Date Taking? Authorizing Provider  albuterol (VENTOLIN HFA) 108 (90 Base) MCG/ACT inhaler Inhale 4 puffs into the lungs every 6 (six) hours as needed for wheezing or shortness of breath. 01/15/20   Regalado, Belkys A, MD  amLODipine (NORVASC) 10 MG tablet Take 1 tablet (10 mg total) by mouth daily. 01/16/20   Regalado, Belkys A, MD  atenolol-chlorthalidone (TENORETIC) 50-25 MG tablet Take 1 tablet by mouth daily. 02/26/20   [provider]  azithromycin (ZITHROMAX) 250 MG tablet Take 1 tablet (250 mg total) by mouth daily. 01/15/20   Regalado, Belkys A, MD  benzonatate (TESSALON) 100 MG capsule Take 1 capsule (100 mg total) by mouth 3 (three) times daily as needed for cough. 01/15/20   Regalado, Belkys A, MD  etonogestrel (NEXPLANON) 68 MG IMPL implant 1 each by Subdermal route once.    [provider]  FLUoxetine (PROZAC) 10 MG capsule Take 1 capsule (10 mg total) by mouth daily. 01/15/20   Regalado, Belkys A, MD  lisinopril (ZESTRIL) 20 MG tablet Take 1 tablet (20 mg total) by mouth daily. 01/16/20   Regalado, Belkys A, MD  meloxicam (MOBIC) 15 MG tablet Take 15 mg by mouth daily. 09/03/19   [provider]  mometasone-formoterol (DULERA) 200-5 MCG/ACT AERO  Inhale 1 puff into the lungs 2 (two) times daily. 01/15/20   Regalado, Belkys A, MD  nicotine (NICODERM CQ - DOSED IN MG/24 HOURS) 21 mg/24hr patch Place 1 patch (21 mg total) onto the skin daily. 01/16/20   Regalado, Belkys A, MD  polyethylene glycol powder (GLYCOLAX/MIRALAX) 17 GM/SCOOP powder Take 17 g by mouth daily. 05/30/20   Zigmund Gottron, NP      Allergies    Other, Amoxicillin, Peanut-containing drug products, Penicillins, and Strawberry extract    Review of Systems   Review of Systems  All other systems reviewed and are negative.   Physical Exam Updated Vital Signs BP (!) 136/111 (BP Location: Right Arm)   Pulse (!) 130   Temp 99.8 F (37.7 C) (Oral)   Resp 17   SpO2 91%  Physical Exam Vitals and nursing note reviewed.  Constitutional:      General: She is not in acute distress.    Appearance: Normal appearance. She is well-developed. She is not toxic-appearing.  HENT:     Head: Normocephalic and atraumatic.  Eyes:     General: Lids are normal.     Conjunctiva/sclera: Conjunctivae normal.     Pupils: Pupils are equal, round, and reactive to light.  Neck:     Thyroid: No thyroid mass.     Trachea: No tracheal deviation.  Cardiovascular:     Rate and Rhythm: Regular  rhythm. Tachycardia present.     Heart sounds: Normal heart sounds. No murmur heard.    No gallop.  Pulmonary:     Effort: Pulmonary effort is normal. Tachypnea and prolonged expiration present. No respiratory distress.     Breath sounds: No stridor. Decreased breath sounds and wheezing present. No rhonchi or rales.  Abdominal:     General: There is no distension.     Palpations: Abdomen is soft.     Tenderness: There is no abdominal tenderness. There is no rebound.  Musculoskeletal:        General: No tenderness. Normal range of motion.     Cervical back: Normal range of motion and neck supple.  Skin:    General: Skin is warm and dry.     Findings: No abrasion or rash.  Neurological:     Mental  Status: She is alert and oriented to person, place, and time. Mental status is at baseline.     GCS: GCS eye subscore is 4. GCS verbal subscore is 5. GCS motor subscore is 6.     Cranial Nerves: No cranial nerve deficit.     Sensory: No sensory deficit.     Motor: Motor function is intact.  Psychiatric:        Attention and Perception: Attention normal.        Speech: Speech normal.        Behavior: Behavior normal.     ED Results / Procedures / Treatments   Labs (all labs ordered are listed, but only abnormal results are displayed) Labs Reviewed  COMPREHENSIVE METABOLIC PANEL - Abnormal; Notable for the following components:      Result Value   Glucose, Bld 183 (*)    Albumin 3.4 (*)    All other components within normal limits  BLOOD GAS, VENOUS - Abnormal; Notable for the following components:   pCO2, Ven 61 (*)    pO2, Ven 31 (*)    Bicarbonate 36.9 (*)    Acid-Base Excess 9.6 (*)    All other components within normal limits  CBC WITH DIFFERENTIAL/PLATELET - Abnormal; Notable for the following components:   WBC 13.2 (*)    Platelets 461 (*)    Neutro Abs 8.5 (*)    Monocytes Absolute 1.5 (*)    Abs Immature Granulocytes 0.09 (*)    All other components within normal limits  RESP PANEL BY RT-PCR (RSV, FLU A&B, COVID)  RVPGX2  CULTURE, BLOOD (ROUTINE X 2)  CULTURE, BLOOD (ROUTINE X 2)  BRAIN NATRIURETIC PEPTIDE  URINALYSIS, ROUTINE W REFLEX MICROSCOPIC  HCG, QUANTITATIVE, PREGNANCY  LACTIC ACID, PLASMA  LACTIC ACID, PLASMA  TROPONIN I (HIGH SENSITIVITY)    EKG EKG Interpretation  Date/Time:  Tuesday September 06 2022 21:28:10 EST Ventricular Rate:  129 PR Interval:  105 QRS Duration: 78 QT Interval:  373 QTC Calculation: 547 R Axis:   83 Text Interpretation: Sinus tachycardia Nonspecific T abnormalities, diffuse leads Prolonged QT interval Confirmed by Lacretia Leigh ([id number]) on 09/06/2022 10:29:01 PM  Radiology DG Chest Port 1 View  Result Date:  09/06/2022 CLINICAL DATA:  Shortness of breath and asthma.  Hypoxia. EXAM: PORTABLE CHEST 1 VIEW COMPARISON:  01/13/2020 FINDINGS: Airway thickening is present, suggesting bronchitis or reactive airways disease. Hazy density at the overlap of the right sixth and fourth ribs as well as the inferior scapula is probably due to superimposition of vascular and osseous shadows, but could represent a small amount of atelectasis. The lungs appear otherwise clear. Cardiac  and mediastinal margins appear normal. No blunting of the costophrenic angles. IMPRESSION: 1. Airway thickening is present, suggesting bronchitis or reactive airways disease. 2. Hazy density at the overlap of the right sixth and fourth ribs and the inferior scapula is probably due to superimposition of vascular and osseous shadows, but could represent a small amount of atelectasis. Electronically Signed   By: Van Clines M.D.   On: 09/06/2022 21:40    Procedures Procedures    Medications Ordered in ED Medications  azithromycin (ZITHROMAX) 500 mg in sodium chloride 0.9 % 250 mL IVPB (has no administration in time range)  cefTRIAXone (ROCEPHIN) 1 g in sodium chloride 0.9 % 100 mL IVPB (has no administration in time range)  methylPREDNISolone sodium succinate (SOLU-MEDROL) 125 mg/2 mL injection 125 mg (has no administration in time range)  ipratropium-albuterol (DUONEB) 0.5-2.5 (3) MG/3ML nebulizer solution 3 mL (has no administration in time range)    ED Course/ Medical Decision Making/ A&P                           Medical Decision Making Amount and/or Complexity of Data Reviewed Labs: ordered. Radiology: ordered.  Risk Prescription drug management.   Patient is EKG per interpretation shows sinus tachycardia with nonspecific T wave changes.  Chest x-ray per my interpretation shows airway thickening.  Patient with wheezing here and was given albuterol along with Solu-Medrol.  Patient with low-grade temperature and will be  empirically treated for pneumonia with empiric antibiotics.  Low suspicion for PE at this time.  Patient will require admission for treatment of her COPD exacerbation with possible underlying pneumonia.  Will consult hospitalist team  CRITICAL CARE Performed by: Leota Jacobsen Total critical care time: 50 minutes Critical care time was exclusive of separately billable procedures and treating other patients. Critical care was necessary to treat or prevent imminent or life-threatening deterioration. Critical care was time spent personally by me on the following activities: development of treatment plan with patient and/or surrogate as well as nursing, discussions with consultants, evaluation of patient's response to treatment, examination of patient, obtaining history from patient or surrogate, ordering and performing treatments and interventions, ordering and review of laboratory studies, ordering and review of radiographic studies, pulse oximetry and re-evaluation of patient's condition.         Final Clinical Impression(s) / ED Diagnoses Final diagnoses:  None    Rx / DC Orders ED Discharge Orders     None         Lacretia Leigh, MD 09/06/22 2258

## 2022-09-07 DIAGNOSIS — E119 Type 2 diabetes mellitus without complications: Secondary | ICD-10-CM

## 2022-09-07 DIAGNOSIS — R9431 Abnormal electrocardiogram [ECG] [EKG]: Secondary | ICD-10-CM

## 2022-09-07 DIAGNOSIS — J441 Chronic obstructive pulmonary disease with (acute) exacerbation: Secondary | ICD-10-CM | POA: Diagnosis not present

## 2022-09-07 DIAGNOSIS — J9601 Acute respiratory failure with hypoxia: Secondary | ICD-10-CM | POA: Insufficient documentation

## 2022-09-07 HISTORY — DX: Abnormal electrocardiogram (ECG) (EKG): R94.31

## 2022-09-07 LAB — URINALYSIS, ROUTINE W REFLEX MICROSCOPIC
Bacteria, UA: NONE SEEN
Bilirubin Urine: NEGATIVE
Glucose, UA: >=500 mg/dL — AB
Ketones, ur: 20 mg/dL — AB
Leukocytes,Ua: NEGATIVE
Nitrite: NEGATIVE
Protein, ur: NEGATIVE mg/dL
Specific Gravity, Urine: 1.025 (ref 1.005–1.030)
pH: 6 (ref 5.0–8.0)

## 2022-09-07 LAB — CBC
HCT: 42.5 % (ref 36.0–46.0)
Hemoglobin: 13.1 g/dL (ref 12.0–15.0)
MCH: 26.3 pg (ref 26.0–34.0)
MCHC: 30.8 g/dL (ref 30.0–36.0)
MCV: 85.3 fL (ref 80.0–100.0)
Platelets: 471 10*3/uL — ABNORMAL HIGH (ref 150–400)
RBC: 4.98 MIL/uL (ref 3.87–5.11)
RDW: 15.5 % (ref 11.5–15.5)
WBC: 14.9 10*3/uL — ABNORMAL HIGH (ref 4.0–10.5)
nRBC: 0 % (ref 0.0–0.2)

## 2022-09-07 LAB — TSH: TSH: 0.406 u[IU]/mL (ref 0.350–4.500)

## 2022-09-07 LAB — CBG MONITORING, ED
Glucose-Capillary: 398 mg/dL — ABNORMAL HIGH (ref 70–99)
Glucose-Capillary: 409 mg/dL — ABNORMAL HIGH (ref 70–99)

## 2022-09-07 LAB — MAGNESIUM: Magnesium: 2.2 mg/dL (ref 1.7–2.4)

## 2022-09-07 LAB — LACTIC ACID, PLASMA: Lactic Acid, Venous: 1.9 mmol/L (ref 0.5–1.9)

## 2022-09-07 LAB — GLUCOSE, CAPILLARY
Glucose-Capillary: 348 mg/dL — ABNORMAL HIGH (ref 70–99)
Glucose-Capillary: 412 mg/dL — ABNORMAL HIGH (ref 70–99)

## 2022-09-07 LAB — HIV ANTIBODY (ROUTINE TESTING W REFLEX): HIV Screen 4th Generation wRfx: NONREACTIVE

## 2022-09-07 LAB — TROPONIN I (HIGH SENSITIVITY): Troponin I (High Sensitivity): 4 ng/L (ref <18)

## 2022-09-07 MED ORDER — NICOTINE 14 MG/24HR TD PT24
14.0000 mg | MEDICATED_PATCH | Freq: Every day | TRANSDERMAL | Status: DC
  Administered 2022-09-07 – 2022-09-10 (×4): 14 mg via TRANSDERMAL
  Filled 2022-09-07 (×4): qty 1

## 2022-09-07 MED ORDER — INSULIN GLARGINE-YFGN 100 UNIT/ML ~~LOC~~ SOLN
10.0000 [IU] | Freq: Every day | SUBCUTANEOUS | Status: DC
  Administered 2022-09-07 – 2022-09-10 (×4): 10 [IU] via SUBCUTANEOUS
  Filled 2022-09-07 (×4): qty 0.1

## 2022-09-07 MED ORDER — ACETAMINOPHEN 325 MG PO TABS
650.0000 mg | ORAL_TABLET | Freq: Four times a day (QID) | ORAL | Status: DC | PRN
  Administered 2022-09-07 – 2022-09-09 (×2): 650 mg via ORAL
  Filled 2022-09-07 (×2): qty 2

## 2022-09-07 MED ORDER — MELATONIN 5 MG PO TABS
5.0000 mg | ORAL_TABLET | Freq: Every evening | ORAL | Status: DC | PRN
  Administered 2022-09-07 – 2022-09-09 (×3): 5 mg via ORAL
  Filled 2022-09-07 (×3): qty 1

## 2022-09-07 MED ORDER — ALPRAZOLAM 0.5 MG PO TABS
0.5000 mg | ORAL_TABLET | Freq: Three times a day (TID) | ORAL | Status: DC | PRN
  Administered 2022-09-08 – 2022-09-09 (×3): 0.5 mg via ORAL
  Filled 2022-09-07 (×3): qty 1

## 2022-09-07 MED ORDER — INSULIN ASPART 100 UNIT/ML IJ SOLN
12.0000 [IU] | Freq: Once | INTRAMUSCULAR | Status: AC
  Administered 2022-09-07: 12 [IU] via SUBCUTANEOUS
  Filled 2022-09-07: qty 0.12

## 2022-09-07 MED ORDER — OXYCODONE HCL 5 MG PO TABS
5.0000 mg | ORAL_TABLET | ORAL | Status: DC | PRN
  Administered 2022-09-07 – 2022-09-10 (×7): 5 mg via ORAL
  Filled 2022-09-07 (×7): qty 1

## 2022-09-07 MED ORDER — GUAIFENESIN-DM 100-10 MG/5ML PO SYRP
5.0000 mL | ORAL_SOLUTION | ORAL | Status: DC | PRN
  Administered 2022-09-07 – 2022-09-10 (×4): 5 mL via ORAL
  Filled 2022-09-07 (×4): qty 10

## 2022-09-07 MED ORDER — ATENOLOL-CHLORTHALIDONE 50-25 MG PO TABS: 1.0000 | ORAL_TABLET | Freq: Every day | ORAL | Status: DC

## 2022-09-07 MED ORDER — INSULIN ASPART 100 UNIT/ML IJ SOLN
3.0000 [IU] | Freq: Three times a day (TID) | INTRAMUSCULAR | Status: DC
  Administered 2022-09-07 – 2022-09-10 (×9): 3 [IU] via SUBCUTANEOUS
  Filled 2022-09-07: qty 0.03

## 2022-09-07 MED ORDER — LEVALBUTEROL HCL 0.63 MG/3ML IN NEBU
0.6300 mg | INHALATION_SOLUTION | Freq: Four times a day (QID) | RESPIRATORY_TRACT | Status: DC
  Administered 2022-09-07 – 2022-09-08 (×8): 0.63 mg via RESPIRATORY_TRACT
  Filled 2022-09-07 (×8): qty 3

## 2022-09-07 MED ORDER — POTASSIUM CHLORIDE CRYS ER 20 MEQ PO TBCR
40.0000 meq | EXTENDED_RELEASE_TABLET | Freq: Once | ORAL | Status: AC
  Administered 2022-09-07: 40 meq via ORAL
  Filled 2022-09-07: qty 2

## 2022-09-07 MED ORDER — INSULIN ASPART 100 UNIT/ML IJ SOLN
0.0000 [IU] | Freq: Three times a day (TID) | INTRAMUSCULAR | Status: DC
  Administered 2022-09-07: 7 [IU] via SUBCUTANEOUS
  Administered 2022-09-07: 9 [IU] via SUBCUTANEOUS
  Administered 2022-09-08: 3 [IU] via SUBCUTANEOUS
  Administered 2022-09-08: 9 [IU] via SUBCUTANEOUS
  Administered 2022-09-08: 2 [IU] via SUBCUTANEOUS
  Administered 2022-09-09: 1 [IU] via SUBCUTANEOUS
  Administered 2022-09-09: 3 [IU] via SUBCUTANEOUS
  Administered 2022-09-09: 5 [IU] via SUBCUTANEOUS
  Administered 2022-09-10 (×2): 2 [IU] via SUBCUTANEOUS
  Filled 2022-09-07: qty 0.09

## 2022-09-07 MED ORDER — CHLORTHALIDONE 25 MG PO TABS
25.0000 mg | ORAL_TABLET | Freq: Every day | ORAL | Status: DC
  Administered 2022-09-07 – 2022-09-10 (×4): 25 mg via ORAL
  Filled 2022-09-07 (×5): qty 1

## 2022-09-07 MED ORDER — ENOXAPARIN SODIUM 40 MG/0.4ML IJ SOSY
40.0000 mg | PREFILLED_SYRINGE | INTRAMUSCULAR | Status: DC
  Administered 2022-09-07 – 2022-09-10 (×4): 40 mg via SUBCUTANEOUS
  Filled 2022-09-07 (×4): qty 0.4

## 2022-09-07 MED ORDER — GUAIFENESIN ER 600 MG PO TB12
600.0000 mg | ORAL_TABLET | Freq: Two times a day (BID) | ORAL | Status: DC
  Administered 2022-09-07 – 2022-09-09 (×5): 600 mg via ORAL
  Filled 2022-09-07 (×5): qty 1

## 2022-09-07 MED ORDER — METHYLPREDNISOLONE SODIUM SUCC 125 MG IJ SOLR
60.0000 mg | Freq: Two times a day (BID) | INTRAMUSCULAR | Status: DC
  Administered 2022-09-07: 60 mg via INTRAVENOUS
  Filled 2022-09-07: qty 2

## 2022-09-07 MED ORDER — ACETAMINOPHEN 650 MG RE SUPP: 650.0000 mg | Freq: Four times a day (QID) | RECTAL | Status: DC | PRN

## 2022-09-07 MED ORDER — IPRATROPIUM BROMIDE 0.02 % IN SOLN
0.5000 mg | Freq: Four times a day (QID) | RESPIRATORY_TRACT | Status: DC
  Administered 2022-09-07 – 2022-09-08 (×8): 0.5 mg via RESPIRATORY_TRACT
  Filled 2022-09-07 (×8): qty 2.5

## 2022-09-07 MED ORDER — INSULIN ASPART 100 UNIT/ML IJ SOLN
0.0000 [IU] | Freq: Every day | INTRAMUSCULAR | Status: DC
  Administered 2022-09-09: 4 [IU] via SUBCUTANEOUS

## 2022-09-07 MED ORDER — ATENOLOL 50 MG PO TABS
50.0000 mg | ORAL_TABLET | Freq: Every day | ORAL | Status: DC
  Administered 2022-09-07 – 2022-09-10 (×4): 50 mg via ORAL
  Filled 2022-09-07 (×4): qty 1

## 2022-09-07 MED ORDER — INSULIN ASPART 100 UNIT/ML IJ SOLN
6.0000 [IU] | Freq: Once | INTRAMUSCULAR | Status: AC
  Administered 2022-09-07: 6 [IU] via SUBCUTANEOUS

## 2022-09-07 MED ORDER — INSULIN ASPART 100 UNIT/ML IJ SOLN
0.0000 [IU] | Freq: Every day | INTRAMUSCULAR | Status: DC
  Filled 2022-09-07: qty 0.05

## 2022-09-07 MED ORDER — SODIUM CHLORIDE 0.9 % IV SOLN: INTRAVENOUS | Status: DC

## 2022-09-07 MED ORDER — PREDNISONE 20 MG PO TABS
40.0000 mg | ORAL_TABLET | Freq: Every day | ORAL | Status: DC
  Administered 2022-09-08 – 2022-09-10 (×3): 40 mg via ORAL
  Filled 2022-09-07 (×3): qty 2

## 2022-09-07 NOTE — ED Notes (Signed)
Pt ambulated in room on room air and her O2 sat stayed between 90-95%. Pt is tolerating room air well. No distress noted

## 2022-09-07 NOTE — H&P (Signed)
History and Physical    Katherine Douglas QMG:[id number] DOB: June 08, 1981 DOA: 09/06/2022  PCP: [facility] [place]  Patient coming from: Home  Chief Complaint: Shortness of breath  HPI: Katherine Douglas is a 41 y.o. female with medical history significant of COPD, type 2 diabetes, hypertension, anxiety, depression, marijuana abuse presented to ED via EMS for shortness of breath and wheezing.  Oxygen saturation 84% on room air upon EMS arrival.  She was given epinephrine, Solu-Medrol, magnesium, and DuoNeb in route.  In the ED, patient was tachycardic and tachypneic but afebrile and not hypotensive.  Labs showing WBC count 13.2, platelet count 461k (chronic mild elevation), glucose 183, COVID and influenza PCR negative, VBG showing pH 7.39 and pCO2 61, troponin negative x 2, BNP normal, lactic acid normal, blood cultures drawn.  Chest x-ray not suggestive of pneumonia. Patient was given DuoNeb, Solu-Medrol, azithromycin, and ceftriaxone.  TRH called to admit.  Patient is reporting 3-day history of progressively worsening dyspnea on exertion, productive cough, wheezing, chest tightness, and palpitations.  She is using a rescue inhaler but does not have a maintenance inhaler for her COPD.  She has been smoking cigarettes for the past 20 years and currently smoking about 1/2 pack/day.  She denies fever, rhinorrhea, or sore throat.  She does not use oxygen at home.  No other complaints.  Review of Systems:  Review of Systems  All other systems reviewed and are negative.   Past Medical History:  Diagnosis Date   Asthma    Depression    Diabetes mellitus without complication ([place])    pt states was gestational with previous pregnancy   Headache    Hypertension    Panic attacks    Pneumonia    Stress     Past Surgical History:  Procedure Laterality Date   INDUCED ABORTION       reports that she quit smoking about 7 years ago. Her smoking use included cigarettes. She has a 4.50  pack-year smoking history. She has never used smokeless tobacco. She reports that she does not currently use alcohol. She reports current drug use. Frequency: 4.00 times per week. Drug: Marijuana.  Allergies  Allergen Reactions   Other Anaphylaxis    Crawfish    Amoxicillin Hives    Has patient had a PCN reaction causing immediate rash, facial/tongue/throat swelling, SOB or lightheadedness with hypotension:unsure Has patient had a PCN reaction causing severe rash involving mucus membranes or skin necrosis:No Has patient had a PCN reaction that required hospitalization:No Has patient had a PCN reaction occurring within the last 10 years: No If all of the above answers are "NO", then may proceed with Cephalosporin use.    Peanut-Containing Drug Products Itching   Penicillins Itching    Has patient had a PCN reaction causing immediate rash, facial/tongue/throat swelling, SOB or lightheadedness with hypotension:unsure Has patient had a PCN reaction causing severe rash involving mucus membranes or skin necrosis:No Has patient had a PCN reaction that required hospitalization:No Has patient had a PCN reaction occurring within the last 10 years: No If all of the above answers are "NO", then may proceed with Cephalosporin use.     Strawberry Extract Hives, Itching and Rash    Family History  Problem Relation Age of Onset   Hypertension Mother    Heart failure Mother    Diabetes Father    CAD Other     Prior to Admission medications   Medication Sig Start Date End Date Taking? Authorizing Provider  albuterol (VENTOLIN HFA) 108 (90 Base) MCG/ACT inhaler Inhale 4 puffs into the lungs every 6 (six) hours as needed for wheezing or shortness of breath. 01/15/20  Yes Regalado, Belkys A, MD  atenolol-chlorthalidone (TENORETIC) 50-25 MG tablet Take 1 tablet by mouth daily. 02/26/20  Yes [provider]  etonogestrel (NEXPLANON) 68 MG IMPL implant 1 each by Subdermal route once.   Yes  [provider]  naloxone (NARCAN) nasal spray 4 mg/0.1 mL Place 1 spray into the nose as needed (accidental overdose). 08/02/22  Yes [provider]  oxyCODONE (OXY IR/ROXICODONE) 5 MG immediate release tablet Take 5 mg by mouth every 4 (four) hours as needed for moderate pain or severe pain. 08/30/22  Yes [provider]  tiZANidine (ZANAFLEX) 4 MG tablet Take 4 mg by mouth 3 (three) times daily as needed for muscle spasms. 08/02/22  Yes [provider]  Vitamin D, Ergocalciferol, (DRISDOL) 1.25 MG (50000 UNIT) CAPS capsule Take 50,000 Units by mouth once a week. 09/06/22  Yes [provider]  amLODipine (NORVASC) 10 MG tablet Take 1 tablet (10 mg total) by mouth daily. 01/16/20   Regalado, Belkys A, MD  azithromycin (ZITHROMAX) 250 MG tablet Take 1 tablet (250 mg total) by mouth daily. 01/15/20   Regalado, Belkys A, MD  benzonatate (TESSALON) 100 MG capsule Take 1 capsule (100 mg total) by mouth 3 (three) times daily as needed for cough. 01/15/20   Regalado, Belkys A, MD  FLUoxetine (PROZAC) 10 MG capsule Take 1 capsule (10 mg total) by mouth daily. 01/15/20   Regalado, Belkys A, MD  lisinopril (ZESTRIL) 20 MG tablet Take 1 tablet (20 mg total) by mouth daily. 01/16/20   Regalado, Belkys A, MD  meloxicam (MOBIC) 15 MG tablet Take 15 mg by mouth daily. 09/03/19   [provider]  mometasone-formoterol (DULERA) 200-5 MCG/ACT AERO Inhale 1 puff into the lungs 2 (two) times daily. 01/15/20   Regalado, Belkys A, MD  nicotine (NICODERM CQ - DOSED IN MG/24 HOURS) 21 mg/24hr patch Place 1 patch (21 mg total) onto the skin daily. 01/16/20   Regalado, Belkys A, MD  polyethylene glycol powder (GLYCOLAX/MIRALAX) 17 GM/SCOOP powder Take 17 g by mouth daily. 05/30/20   Zigmund Gottron, NP    Physical Exam: Vitals:   09/07/22 0000 09/07/22 0021 09/07/22 0040 09/07/22 0130  BP: (!) 152/130  (!) 154/102 (!) 142/89  Pulse: (!) 119 (!) 117 (!) 109 (!) 114  Resp: (!) 24  (!) '21 20 19  '$ Temp:  98.3 F (36.8 C)    TempSrc:  Oral    SpO2: 91% 94% 97% 94%    Physical Exam Vitals reviewed.  Constitutional:      General: She is not in acute distress. HENT:     Head: Normocephalic and atraumatic.  Eyes:     Extraocular Movements: Extraocular movements intact.  Cardiovascular:     Rate and Rhythm: Regular rhythm. Tachycardia present.     Pulses: Normal pulses.     Comments: Tachycardic to the 110s Pulmonary:     Effort: Pulmonary effort is normal. No respiratory distress.     Breath sounds: Wheezing present.     Comments: Diffuse end expiratory wheezing Abdominal:     General: Bowel sounds are normal. There is no distension.     Palpations: Abdomen is soft.     Tenderness: There is no abdominal tenderness.  Musculoskeletal:     Cervical back: Normal range of motion.     Right  lower leg: No edema.     Left lower leg: No edema.  Skin:    General: Skin is warm and dry.  Neurological:     General: No focal deficit present.     Mental Status: She is alert and oriented to person, place, and time.     Labs on Admission: I have personally reviewed following labs and imaging studies  CBC: Recent Labs  Lab 09/06/22 2149  WBC 13.2*  NEUTROABS 8.5*  HGB 12.8  HCT 42.1  MCV 85.9  PLT 244*   Basic Metabolic Panel: Recent Labs  Lab 09/06/22 2149  NA 139  K 3.5  CL 101  CO2 29  GLUCOSE 183*  BUN 8  CREATININE 0.72  CALCIUM 9.2   GFR: CrCl cannot be calculated (Unknown ideal weight.). Liver Function Tests: Recent Labs  Lab 09/06/22 2149  AST 35  ALT 33  ALKPHOS 96  BILITOT 0.4  PROT 7.4  ALBUMIN 3.4*   No results for input(s): "LIPASE", "AMYLASE" in the last 168 hours. No results for input(s): "AMMONIA" in the last 168 hours. Coagulation Profile: No results for input(s): "INR", "PROTIME" in the last 168 hours. Cardiac Enzymes: No results for input(s): "CKTOTAL", "CKMB", "CKMBINDEX", "TROPONINI" in the last 168 hours. BNP  (last 3 results) No results for input(s): "PROBNP" in the last 8760 hours. HbA1C: No results for input(s): "HGBA1C" in the last 72 hours. CBG: No results for input(s): "GLUCAP" in the last 168 hours. Lipid Profile: No results for input(s): "CHOL", "HDL", "LDLCALC", "TRIG", "CHOLHDL", "LDLDIRECT" in the last 72 hours. Thyroid Function Tests: No results for input(s): "TSH", "T4TOTAL", "FREET4", "T3FREE", "THYROIDAB" in the last 72 hours. Anemia Panel: No results for input(s): "VITAMINB12", "FOLATE", "FERRITIN", "TIBC", "IRON", "RETICCTPCT" in the last 72 hours. Urine analysis:    Component Value Date/Time   COLORURINE YELLOW 01/12/2020 0536   APPEARANCEUR CLEAR 01/12/2020 0536   LABSPEC 1.025 05/30/2020 1823   PHURINE 6.5 05/30/2020 1823   GLUCOSEU NEGATIVE 05/30/2020 1823   HGBUR TRACE (A) 05/30/2020 1823   BILIRUBINUR NEGATIVE 05/30/2020 1823   BILIRUBINUR negative 04/08/2015 1637   KETONESUR NEGATIVE 05/30/2020 1823   PROTEINUR NEGATIVE 05/30/2020 1823   UROBILINOGEN 0.2 05/30/2020 1823   NITRITE POSITIVE (A) 05/30/2020 1823   LEUKOCYTESUR TRACE (A) 05/30/2020 1823    Radiological Exams on Admission: DG Chest Port 1 View  Result Date: 09/06/2022 CLINICAL DATA:  Shortness of breath and asthma.  Hypoxia. EXAM: PORTABLE CHEST 1 VIEW COMPARISON:  01/13/2020 FINDINGS: Airway thickening is present, suggesting bronchitis or reactive airways disease. Hazy density at the overlap of the right sixth and fourth ribs as well as the inferior scapula is probably due to superimposition of vascular and osseous shadows, but could represent a small amount of atelectasis. The lungs appear otherwise clear. Cardiac and mediastinal margins appear normal. No blunting of the costophrenic angles. IMPRESSION: 1. Airway thickening is present, suggesting bronchitis or reactive airways disease. 2. Hazy density at the overlap of the right sixth and fourth ribs and the inferior scapula is probably due to  superimposition of vascular and osseous shadows, but could represent a small amount of atelectasis. Electronically Signed   By: Van Clines M.D.   On: 09/06/2022 21:40    EKG: Independently reviewed.  Sinus tachycardia, borderline T wave abnormalities inferolaterally, QTc 547.  Assessment and Plan  Acute hypoxemic respiratory failure secondary to acute COPD exacerbation Patient received bronchodilator treatments and Solu-Medrol.  Oxygen saturation 91% on room air in the ED, currently  stable on 2 L O2.  Work of breathing has improved but continues to have wheezing.  Mild leukocytosis on labs but afebrile.  Chest x-ray not suggestive of pneumonia.  COVID and influenza PCR negative. -Continue Solu-Medrol 60 mg every 12 hours -Scheduled Xopenex and Atrovent nebs -Stop azithromycin given QT prolongation.  Continue ceftriaxone. -Mucinex -Incentive spirometry, flutter valve -Continue supplemental oxygen, wean as tolerated  Sinus tachycardia Heart rate currently in the 110s. -Avoid albuterol -IV fluid hydration  Tobacco abuse Smokes 1/2 pack of cigarettes daily. -Counseling and NicoDerm patch  QT prolongation QTc 547.  Potassium is within normal range. -Cardiac monitoring -Avoid QT prolonging drugs -Repeat EKG in a.m. -Keep potassium >4 -Check magnesium level and keep level >2  Type 2 diabetes Last A1c 7.1 in April 2021 and not on medications. -Repeat A1c -Sensitive sliding scale insulin ACHS  Hypertension Most recent blood pressure 141/80. -Continue atenolol-chlorthalidone -Per pharmacy med rec, patient is no longer taking amlodipine and lisinopril. -Monitor blood pressure closely  DVT prophylaxis: Lovenox Code Status: Full Code Family Communication: No family available at this time. Level of care: Telemetry bed Admission status: It is my clinical opinion that referral for OBSERVATION is reasonable and necessary in this patient based on the above information provided.  The aforementioned taken together are felt to place the patient at high risk for further clinical deterioration. However, it is anticipated that the patient may be medically stable for discharge from the hospital within 24 to 48 hours.   Shela Leff MD [facility]  If 7PM-7AM, please contact night-coverage [email or web address]  09/07/2022, 1:51 AM

## 2022-09-07 NOTE — ED Notes (Signed)
admit Provider at bedside. 

## 2022-09-07 NOTE — ED Notes (Signed)
Pt ambulated without oxygen. Oxygen levels stayed between 97-100.

## 2022-09-07 NOTE — Inpatient Diabetes Management (Signed)
Inpatient Diabetes Program Recommendations  AACE/ADA: New Consensus Statement on Inpatient Glycemic Control (2015)  Target Ranges:  Prepandial:   less than 140 mg/dL      Peak postprandial:   less than 180 mg/dL (1-2 hours)      Critically ill patients:  140 - 180 mg/dL   Lab Results  Component Value Date   GLUCAP 398 (H) 09/07/2022   HGBA1C 7.1 (H) 01/12/2020    Review of Glycemic Control  Latest Reference Range & Units 09/07/22 09:04  Glucose-Capillary 70 - 99 mg/dL 398 (H)   Diabetes history: DM 2 Outpatient Diabetes medications: diet controlled Current orders for Inpatient glycemic control:  Novolog 0-9 units tid + hs Solumedrol 60 mg Q12 hours  Inpatient Diabetes Program Recommendations:    -   Start Semglee 10 units -   Add Novolog 3 units tid meal coverage if eating >50% of meals   Thanks,  Tama Headings RN, MSN, BC-ADM Inpatient Diabetes Coordinator Team Pager [phone] (8a-5p)

## 2022-09-07 NOTE — Progress Notes (Addendum)
PROGRESS NOTE  Katherine Douglas  DOB: 12-Oct-1980  PCP: [facility] [place] ZOX:[id number]  DOA: 09/06/2022  LOS: 0 days  Hospital Day: 2  Brief narrative: Katherine Douglas is a 41 y.o. female with PMH significant for DM2, HTN, COPD, smokes about half pack per day for last 20 years, anxiety/depression, marijuana use 12/12, patient was brought to the ED from home by EMS for 3 days history of progressive shortness of breath, productive cough, chest pain, wheezing not responding to her regular inhalers.  EMS noted O2 sat low at 84% on room air. En route to the hospital, she was given epinephrine, SoluMedrol, magnesium, and DuoNeb.  In the ED, patient was tachycardic and tachypneic but afebrile and not hypotensive.  Labs showed WBC count 13.2, platelet count 461k (chronic mild elevation), glucose 183 COVID and influenza PCR negative VBG showed pH 7.39 and pCO2 61,  troponin negative x 2, BNP normal, lactic acid normal blood cultures drawn.   Chest xray not suggestive of pneumonia.  Patient was given IV ceftriaxone, azithromycin, IV Solu-Medrol, DuoNeb [facility]   Subjective: Patient was seen and examined this morning.  Young morbidly obese African-American female.  Lying on bed.  Not on supplemental oxygen at rest.  Reports dyspnea and worsening of tachycardia on any movement.  Assessment and plan: Acute exacerbation of COPD  Acute respiratory failure with hypoxia Chronic compensated hypercapnic respiratory acidosis Chronic everyday smoker Presented with progressively worsening respiratory symptoms, hypoxia, no fever. Chest x-ray without evidence of pneumonia Currently presumptively covered on IV Rocephin Currently on IV Solu-Medrol 60 mg twice daily.  Will switch to oral prednisone 40 mg daily. Continue bronchodilators, Mucinex, incentive spirometry On examination this morning, patient is still wheezing at rest.  Significantly dyspneic and tachycardic on minimal  movement Currently on 2 L oxygen by nasal elevated wean down as tolerated.  Check ambulatory oxygen requirement Counseled to quit smoking.  Nicotine patch offered   Sinus tachycardia Heart rate currently in the 110s.  Patient reports he is always tachycardic.  Chart reviewed.  It seems he had an echocardiogram done in 2021 for sinus tachycardia and hypertensive urgency.  It showed EF of 55 to 60%, no WMA, moderate concentric LVH, normal right ventricular pressure and function. Obtain TSH.  Continue to monitor atenolol. Avoid albuterol.  Xopenex ordered  Anxiety/depression Patient seems to have uncontrolled anxiety is the main driver of her smoking and uncontrolled weight.  She states she was tried on different antianxiety pills including Valium by her PCP but she got frustrated and stopped by self.  Will try her on low-dose Xanax in the hospital.  Need to follow-up with PCP/mental provider post discharge for long-term management    QT prolongation Initial EKG with QTc 547.  Electrolytes within normal range. Recent Labs  Lab 09/06/22 2149 09/07/22 0010  K 3.5  --   MG  --  2.2   Type 2 diabetes mellitus Hyperglycemia due to steroids A1c 7.1 in 2021.  Obtain repeat A1c PTA not on meds. Blood sugar running elevated close to 400 today tolerated IV steroids. Diabetes care coordinator consult appreciated.  Started on Semglee 10 units this morning.  Add NovoLog 3 units 3 times daily with meals.  Continue sliding scale insulin with Accu-Cheks Recent Labs  Lab 09/07/22 0904 09/07/22 1224  GLUCAP 398* 409*   Hypertension PTA on atenolol, chlorthalidone.  Resume the same.   Morbid obesity  -There is no height or weight on file to calculate BMI. Patient  has been advised to make an attempt to improve diet and exercise patterns to aid in weight loss.   Goals of care   Code Status: Full Code   Mobility: Encourage ambulation.  Check ambulatory oxygen requirement  Scheduled Meds:   atenolol  50 mg Oral Daily   And   chlorthalidone  25 mg Oral Daily   enoxaparin (LOVENOX) injection  40 mg Subcutaneous Q24H   guaiFENesin  600 mg Oral BID   insulin aspart  0-5 Units Subcutaneous QHS   insulin aspart  0-9 Units Subcutaneous TID WC   insulin aspart  12 Units Subcutaneous Once   insulin aspart  3 Units Subcutaneous TID WC   insulin glargine-yfgn  10 Units Subcutaneous Daily   ipratropium  0.5 mg Nebulization Q6H   levalbuterol  0.63 mg Nebulization Q6H   nicotine  14 mg Transdermal Daily   [START ON 09/08/2022] predniSONE  40 mg Oral Q breakfast    PRN meds: acetaminophen **OR** acetaminophen, ALPRAZolam   Infusions:   cefTRIAXone (ROCEPHIN)  IV Stopped (09/07/22 0111)    Skin assessment:     Nutritional status:  There is no height or weight on file to calculate BMI.          Diet:  Diet Order             Diet Carb Modified Fluid consistency: Thin; Room service appropriate? Yes  Diet effective now                   DVT prophylaxis:  enoxaparin (LOVENOX) injection 40 mg Start: 09/07/22 1000   Antimicrobials: IV Rocephin Fluid: Stop IV hydration Consultants: None Family Communication: None at bedside  Status is: Observation  Continue inhospital care because: Remains significantly dyspneic, tachycardic Level of care: Telemetry   Dispo: The patient is from: Home              Anticipated d/c is to: Hopefully home in 1 to 2 days              Patient currently is not medically stable to d/c.   Difficult to place patient No    Antimicrobials: Anti-infectives (From admission, onward)    Start     Dose/Rate Route Frequency Ordered Stop   09/06/22 2245  azithromycin (ZITHROMAX) 500 mg in sodium chloride 0.9 % 250 mL IVPB  Status:  Discontinued        500 mg 250 mL/hr over 60 Minutes Intravenous Every 24 hours 09/06/22 2231 09/07/22 0308   09/06/22 2245  cefTRIAXone (ROCEPHIN) 1 g in sodium chloride 0.9 % 100 mL IVPB        1 g 200  mL/hr over 30 Minutes Intravenous Every 24 hours 09/06/22 2231         Objective: Vitals:   09/07/22 0837 09/07/22 1030  BP:  (!) 147/97  Pulse:  (!) 110  Resp:  (!) 25  Temp: 98.8 F (37.1 C)   SpO2:  96%    Intake/Output Summary (Last 24 hours) at 09/07/2022 1300 Last data filed at 09/07/2022 1040 Gross per 24 hour  Intake 300 ml  Output --  Net 300 ml   There were no vitals filed for this visit. Weight change:  There is no height or weight on file to calculate BMI.   Physical Exam: General exam: Pleasant, morbidly obese African-American female Skin: No rashes, lesions or ulcers. HEENT: Atraumatic, normocephalic, no obvious bleeding Lungs: Diffuse mild wheezing, cough on deep breathing CVS: Sinus  tachycardia, no murmur GI/Abd soft, nontender, distended from obesity, bowel present CNS: Alert, awake, oriented x 3 Psychiatry: Mood appropriate Extremities: No pedal edema, no calf tenderness  Data Review: I have personally reviewed the laboratory data and studies available.  F/u labs ordered Unresulted Labs (From admission, onward)     Start     Ordered   09/07/22 0310  Hemoglobin A1c  Once,   R       Comments: To assess prior glycemic control    09/07/22 0310   09/06/22 2230  Culture, blood (Routine X 2) w Reflex to ID Panel  BLOOD CULTURE X 2,   R (with STAT occurrences)     Question:  Patient immune status  Answer:  Normal   09/06/22 2229            Signed, Terrilee Croak, MD [facility] 09/07/2022

## 2022-09-08 DIAGNOSIS — E119 Type 2 diabetes mellitus without complications: Secondary | ICD-10-CM | POA: Diagnosis present

## 2022-09-08 DIAGNOSIS — J441 Chronic obstructive pulmonary disease with (acute) exacerbation: Secondary | ICD-10-CM | POA: Diagnosis present

## 2022-09-08 DIAGNOSIS — T380X5A Adverse effect of glucocorticoids and synthetic analogues, initial encounter: Secondary | ICD-10-CM | POA: Diagnosis not present

## 2022-09-08 DIAGNOSIS — F419 Anxiety disorder, unspecified: Secondary | ICD-10-CM | POA: Diagnosis present

## 2022-09-08 DIAGNOSIS — D72829 Elevated white blood cell count, unspecified: Secondary | ICD-10-CM | POA: Diagnosis present

## 2022-09-08 DIAGNOSIS — J9601 Acute respiratory failure with hypoxia: Secondary | ICD-10-CM | POA: Diagnosis present

## 2022-09-08 DIAGNOSIS — Z6841 Body Mass Index (BMI) 40.0 and over, adult: Secondary | ICD-10-CM | POA: Diagnosis not present

## 2022-09-08 DIAGNOSIS — E8729 Other acidosis: Secondary | ICD-10-CM | POA: Diagnosis present

## 2022-09-08 DIAGNOSIS — F17210 Nicotine dependence, cigarettes, uncomplicated: Secondary | ICD-10-CM | POA: Diagnosis present

## 2022-09-08 DIAGNOSIS — Z833 Family history of diabetes mellitus: Secondary | ICD-10-CM | POA: Diagnosis not present

## 2022-09-08 DIAGNOSIS — Z23 Encounter for immunization: Secondary | ICD-10-CM | POA: Diagnosis not present

## 2022-09-08 DIAGNOSIS — R0602 Shortness of breath: Secondary | ICD-10-CM | POA: Diagnosis present

## 2022-09-08 DIAGNOSIS — R9431 Abnormal electrocardiogram [ECG] [EKG]: Secondary | ICD-10-CM | POA: Diagnosis present

## 2022-09-08 DIAGNOSIS — Z8249 Family history of ischemic heart disease and other diseases of the circulatory system: Secondary | ICD-10-CM | POA: Diagnosis not present

## 2022-09-08 DIAGNOSIS — Z91010 Allergy to peanuts: Secondary | ICD-10-CM | POA: Diagnosis not present

## 2022-09-08 DIAGNOSIS — Z20822 Contact with and (suspected) exposure to covid-19: Secondary | ICD-10-CM | POA: Diagnosis present

## 2022-09-08 DIAGNOSIS — I10 Essential (primary) hypertension: Secondary | ICD-10-CM | POA: Diagnosis present

## 2022-09-08 DIAGNOSIS — Z91013 Allergy to seafood: Secondary | ICD-10-CM | POA: Diagnosis not present

## 2022-09-08 DIAGNOSIS — F1210 Cannabis abuse, uncomplicated: Secondary | ICD-10-CM | POA: Diagnosis present

## 2022-09-08 DIAGNOSIS — R000 Tachycardia, unspecified: Secondary | ICD-10-CM | POA: Diagnosis present

## 2022-09-08 DIAGNOSIS — E1165 Type 2 diabetes mellitus with hyperglycemia: Secondary | ICD-10-CM | POA: Diagnosis not present

## 2022-09-08 DIAGNOSIS — Z79899 Other long term (current) drug therapy: Secondary | ICD-10-CM | POA: Diagnosis not present

## 2022-09-08 DIAGNOSIS — Z880 Allergy status to penicillin: Secondary | ICD-10-CM | POA: Diagnosis not present

## 2022-09-08 DIAGNOSIS — Z8701 Personal history of pneumonia (recurrent): Secondary | ICD-10-CM | POA: Diagnosis not present

## 2022-09-08 DIAGNOSIS — F410 Panic disorder [episodic paroxysmal anxiety] without agoraphobia: Secondary | ICD-10-CM | POA: Diagnosis present

## 2022-09-08 DIAGNOSIS — F32A Depression, unspecified: Secondary | ICD-10-CM | POA: Diagnosis present

## 2022-09-08 LAB — GLUCOSE, CAPILLARY
Glucose-Capillary: 211 mg/dL — ABNORMAL HIGH (ref 70–99)
Glucose-Capillary: 180 mg/dL — ABNORMAL HIGH (ref 70–99)
Glucose-Capillary: 180 mg/dL — ABNORMAL HIGH (ref 70–99)
Glucose-Capillary: 380 mg/dL — ABNORMAL HIGH (ref 70–99)
Glucose-Capillary: 459 mg/dL — ABNORMAL HIGH (ref 70–99)

## 2022-09-08 LAB — HEMOGLOBIN A1C
Hgb A1c MFr Bld: 8.8 % — ABNORMAL HIGH (ref 4.8–5.6)
Mean Plasma Glucose: 206 mg/dL

## 2022-09-08 LAB — GLUCOSE, RANDOM: Glucose, Bld: 365 mg/dL — ABNORMAL HIGH (ref 70–99)

## 2022-09-08 MED ORDER — PNEUMOCOCCAL VAC POLYVALENT 25 MCG/0.5ML IJ INJ
0.5000 mL | INJECTION | INTRAMUSCULAR | Status: AC
  Administered 2022-09-10: 0.5 mL via INTRAMUSCULAR
  Filled 2022-09-08: qty 0.5

## 2022-09-08 MED ORDER — INSULIN ASPART 100 UNIT/ML IJ SOLN
6.0000 [IU] | Freq: Once | INTRAMUSCULAR | Status: AC
  Administered 2022-09-08: 6 [IU] via SUBCUTANEOUS

## 2022-09-08 MED ORDER — LEVALBUTEROL HCL 0.63 MG/3ML IN NEBU: 0.6300 mg | INHALATION_SOLUTION | RESPIRATORY_TRACT | Status: DC | PRN

## 2022-09-08 MED ORDER — LEVALBUTEROL HCL 0.63 MG/3ML IN NEBU
0.6300 mg | INHALATION_SOLUTION | Freq: Four times a day (QID) | RESPIRATORY_TRACT | Status: DC
  Administered 2022-09-09 – 2022-09-10 (×5): 0.63 mg via RESPIRATORY_TRACT
  Filled 2022-09-08 (×6): qty 3

## 2022-09-08 MED ORDER — IPRATROPIUM BROMIDE 0.02 % IN SOLN
0.5000 mg | Freq: Four times a day (QID) | RESPIRATORY_TRACT | Status: DC
  Administered 2022-09-09 – 2022-09-10 (×5): 0.5 mg via RESPIRATORY_TRACT
  Filled 2022-09-08 (×6): qty 2.5

## 2022-09-08 NOTE — Inpatient Diabetes Management (Signed)
Inpatient Diabetes Program Recommendations  AACE/ADA: New Consensus Statement on Inpatient Glycemic Control (2015)  Target Ranges:  Prepandial:   less than 140 mg/dL      Peak postprandial:   less than 180 mg/dL (1-2 hours)      Critically ill patients:  140 - 180 mg/dL    Latest Reference Range & Units 09/07/22 04:41  Hemoglobin A1C 4.8 - 5.6 % 8.8 (H)  (H): Data is abnormally high  Latest Reference Range & Units 09/07/22 09:04 09/07/22 12:24 09/07/22 17:03 09/07/22 20:19  Glucose-Capillary 70 - 99 mg/dL 398 (H)  9 units Novolog  409 (H)  12 units Novolog  10 units Semglee '@1350'$  348 (H)  10  units Novolog  412 (H)  6 units Novolog   (H): Data is abnormally high  Latest Reference Range & Units 09/08/22 07:20  Glucose-Capillary 70 - 99 mg/dL 211 (H)  (H): Data is abnormally high    Current Orders: Semglee 10 units Daily  Novolog 0-9 units TID ac/hs  Novolog 3 units TID with meals     Getting Prednisone 40 mg Daily  Stopped Solumedrol--last dose given yest at 8am--To start Prednisone today  Started Semglee yest at [employer] yest at 5pm w/ Dinner   MD- Note Current A1c= 8.8%--No Diabetes Meds at home  Consider starting Metformin for home?  500 mg BID to start (GFR >60 and Creatinine 0.72)    --Will follow patient during hospitalization--  Wyn Quaker RN, MSN, [place] Diabetes Coordinator Inpatient Glycemic Control Team Team Pager: [phone] (8a-5p)

## 2022-09-08 NOTE — Progress Notes (Signed)
PROGRESS NOTE  Katherine Douglas  DOB: [birth year]  PCP: [facility] [place] WUJ:[id number]  DOA: 09/06/2022  LOS: 0 days  Hospital Day: 3  Brief narrative: Katherine Douglas is a 41 y.o. female with PMH significant for DM2, HTN, COPD, smokes about half pack per day for last 20 years, anxiety/depression, marijuana use 12/12, patient was brought to the ED from home by EMS for 3 days history of progressive shortness of breath, productive cough, chest pain, wheezing not responding to her regular inhalers.  EMS noted O2 sat low at 84% on room air. En route to the hospital, she was given epinephrine, SoluMedrol, magnesium, and DuoNeb.  In the ED, patient was tachycardic and tachypneic but afebrile and not hypotensive.  Labs showed WBC count 13.2, platelet count 461k (chronic mild elevation), glucose 183 COVID and influenza PCR negative VBG showed pH 7.39 and pCO2 61,  troponin negative x 2, BNP normal, lactic acid normal blood cultures drawn.   Chest xray not suggestive of pneumonia.  Patient was given IV ceftriaxone, azithromycin, IV Solu-Medrol, DuoNeb Admitted to [facility]   Subjective: Patient was seen and examined this morning.   Propped up in bed.  Not in distress at rest.  States that she gets short of breath on ambulation.  O2 sat checked on ambulation yesterday was maintained over 90%.  Patient states he subjectively felt out of breath however.   On exam this morning, she continues to have wheezing. Does not feel comfortable for discharge today.  Assessment and plan: Acute exacerbation of COPD  Acute respiratory failure with hypoxia Chronic compensated hypercapnic respiratory acidosis Chronic everyday smoker Presented with progressively worsening respiratory symptoms, hypoxia, no fever. Chest x-ray without evidence of pneumonia Currently presumptively covered on IV Rocephin. Continue to have wheezing this morning.  Continue prednisone 40 mg daily today. Continue  bronchodilators, Mucinex, incentive spirometry Encourage ambulation. Counseled to quit smoking.  Nicotine patch offered   Sinus tachycardia Heart rate currently in the 110s.  Patient reports he is always tachycardic.  Chart reviewed.  It seems he had an echocardiogram done in 2021 for sinus tachycardia and hypertensive urgency.  It showed EF of 55 to 60%, no WMA, moderate concentric LVH, normal right ventricular pressure and function. TSH normal. In the last 24 hours, heart rate is improving on Adderall.  Avoid albuterol.  Xopenex ordered  Anxiety/depression Patient seems to have uncontrolled anxiety is the main driver of her smoking and uncontrolled weight.  She states she was tried on different antianxiety pills including Valium by her PCP but she got frustrated and stopped by self.  Currently on low-dose Xanax in the hospital.  Need to follow-up with PCP/mental provider post discharge for long-term management    QT prolongation Initial EKG with QTc 547.  Electrolytes within normal range. Recent Labs  Lab 09/06/22 2149 09/07/22 0010  K 3.5  --   MG  --  2.2   Type 2 diabetes mellitus Hyperglycemia due to steroids A1c 8.8 on 09/07/2022 which is higher than 7.1 in 2021.   PTA not on meds. Blood sugar running elevated in the hospital because of steroids. Diabetes care coordinator consult appreciated.  Currently on Semglee 10 units this morning, along with NovoLog 3 units 3 times daily with meals.  Continue sliding scale insulin with Accu-Cheks Recent Labs  Lab 09/07/22 0904 09/07/22 1224 09/07/22 1703 09/07/22 2019 09/08/22 0720  GLUCAP 398* 409* 348* 412* 211*   Hypertension Continue atenolol and chlorthalidone  Morbid obesity  -There is  no height or weight on file to calculate BMI. Patient has been advised to make an attempt to improve diet and exercise patterns to aid in weight loss.   Goals of care   Code Status: Full Code   Mobility: Encourage ambulation.  Check  ambulatory oxygen requirement  Scheduled Meds:  atenolol  50 mg Oral Daily   And   chlorthalidone  25 mg Oral Daily   enoxaparin (LOVENOX) injection  40 mg Subcutaneous Q24H   guaiFENesin  600 mg Oral BID   insulin aspart  0-5 Units Subcutaneous QHS   insulin aspart  0-9 Units Subcutaneous TID WC   insulin aspart  3 Units Subcutaneous TID WC   insulin glargine-yfgn  10 Units Subcutaneous Daily   ipratropium  0.5 mg Nebulization Q6H   levalbuterol  0.63 mg Nebulization Q6H   nicotine  14 mg Transdermal Daily   [START ON 09/09/2022] pneumococcal 23 valent vaccine  0.5 mL Intramuscular Tomorrow-1000   predniSONE  40 mg Oral Q breakfast    PRN meds: acetaminophen **OR** acetaminophen, ALPRAZolam, guaiFENesin-dextromethorphan, melatonin, oxyCODONE   Infusions:   cefTRIAXone (ROCEPHIN)  IV 1 g (09/07/22 2209)    Skin assessment:     Nutritional status:  There is no height or weight on file to calculate BMI.          Diet:  Diet Order             Diet Carb Modified Fluid consistency: Thin; Room service appropriate? Yes  Diet effective now                   DVT prophylaxis:  enoxaparin (LOVENOX) injection 40 mg Start: 09/07/22 1000   Antimicrobials: IV Rocephin Fluid: Not on IV fluid Consultants: None Family Communication: None at bedside  Status is: Observation  Continue inhospital care because: Remains wheezy.  Continues to complain of dyspnea Level of care: Telemetry   Dispo: The patient is from: Home              Anticipated d/c is to: Hopefully home in 1 to 2 days              Patient currently is not medically stable to d/c.   Difficult to place patient No    Antimicrobials: Anti-infectives (From admission, onward)    Start     Dose/Rate Route Frequency Ordered Stop   09/06/22 2245  azithromycin (ZITHROMAX) 500 mg in sodium chloride 0.9 % 250 mL IVPB  Status:  Discontinued        500 mg 250 mL/hr over 60 Minutes Intravenous Every 24 hours  09/06/22 2231 09/07/22 0308   09/06/22 2245  cefTRIAXone (ROCEPHIN) 1 g in sodium chloride 0.9 % 100 mL IVPB        1 g 200 mL/hr over 30 Minutes Intravenous Every 24 hours 09/06/22 2231         Objective: Vitals:   09/08/22 0023 09/08/22 0417  BP: 122/81 (!) 148/101  Pulse: 91 81  Resp: 20 20  Temp: 97.6 F (36.4 C) 97.8 F (36.6 C)  SpO2: 95% 92%    Intake/Output Summary (Last 24 hours) at 09/08/2022 1240 Last data filed at 09/07/2022 1753 Gross per 24 hour  Intake 460.42 ml  Output 200 ml  Net 260.42 ml   There were no vitals filed for this visit. Weight change:  There is no height or weight on file to calculate BMI.   Physical Exam: General exam: Pleasant, morbidly obese African-American  female Skin: No rashes, lesions or ulcers. HEENT: Atraumatic, normocephalic, no obvious bleeding Lungs: Continues to have diffuse mild wheezing, coughs on deep breathing CVS: Sinus tachycardia, no murmur GI/Abd soft, nontender, distended from obesity, bowel present CNS: Alert, awake, oriented x 3 Psychiatry: Mood appropriate Extremities: No pedal edema, no calf tenderness  Data Review: I have personally reviewed the laboratory data and studies available.  F/u labs ordered Unresulted Labs (From admission, onward)     Start     Ordered   09/08/22 1111  Glucose, capillary  Once,   R        09/08/22 1111   09/08/22 1111  Glucose, capillary  Once,   R        09/08/22 1111            Signed, Terrilee Croak, MD [facility] 09/08/2022

## 2022-09-08 NOTE — TOC Initial Note (Signed)
Transition of Care [facility]) - Initial/Assessment Note    Patient Details  Name: Katherine Douglas MRN: [id number] Date of Birth: 21-Aug-1981  Transition of Care [facility]) CM/SW Contact:    Leeroy Cha, RN Phone Number: 09/08/2022, 7:39 AM  Clinical Narrative:                  Transition of Care [facility]) Screening Note   Patient Details  Name: Katherine Douglas Date of Birth: 05-14-1981   Transition of Care [facility]) CM/SW Contact:    Leeroy Cha, RN Phone Number: 09/08/2022, 7:39 AM    Transition of Care Department [facility]) has reviewed patient and no TOC needs have been identified at this time. We will continue to monitor patient advancement through interdisciplinary progression rounds. If new patient transition needs arise, please place a TOC consult.    Expected Discharge Plan: Home/Self Care Barriers to Discharge: Continued Medical Work up   Patient Goals and CMS Choice Patient states their goals for this hospitalization and ongoing recovery are:: to go home CMS Medicare.gov Compare Post Acute Care list provided to:: Patient    Expected Discharge Plan and Services Expected Discharge Plan: Home/Self Care   Discharge Planning Services: CM Consult   Living arrangements for the past 2 months: Single Family Home                                      Prior Living Arrangements/Services Living arrangements for the past 2 months: Single Family Home Lives with:: Self Patient language and need for interpreter reviewed:: Yes Do you feel safe going back to the place where you live?: Yes               Activities of Daily Living   ADL Screening (condition at time of admission) Is the patient deaf or have difficulty hearing?: No Does the patient have difficulty seeing, even when wearing glasses/contacts?: No Does the patient have difficulty concentrating, remembering, or making decisions?: No Does the patient have difficulty dressing or bathing?: No Does  the patient have difficulty walking or climbing stairs?: No  Permission Sought/Granted                  Emotional Assessment Appearance:: Appears stated age Attitude/Demeanor/Rapport: Engaged   Orientation: : Oriented to Self, Oriented to Place, Oriented to Situation, Oriented to  Time Alcohol / Substance Use: Illicit Drugs, Tobacco Use (former smoker, current marijuana use) Psych Involvement: No (comment)  Admission diagnosis:  COPD with acute exacerbation ([place]) [J44.1] Dyspnea, unspecified type [R06.00] Patient Active Problem List   Diagnosis Date Noted   COPD with acute exacerbation ([place]) 09/07/2022   Acute hypoxemic respiratory failure ([place]) 09/07/2022   QT prolongation 09/07/2022   Type 2 diabetes mellitus ([place]) 09/07/2022   Essential hypertension 01/13/2020   Leukocytosis 01/12/2020   Hyperglycemia 01/12/2020   Anxiety 01/12/2020   Asthma exacerbation 01/11/2020   Bronchitis due to tobacco use 01/11/2020   Tobacco abuse 01/11/2020   Hypertensive urgency 01/11/2020   Sinus tachycardia 01/11/2020   Morbid obesity ([place]) 07/05/2017   Multiparity 04/14/2015   PCP:  [facility] [place]:   [place], [place] - [address] [place] AT [facility] [place] [address] Phone: [phone] Fax: [phone]     Social Determinants of Health (SDOH) Interventions Housing Interventions: Inpatient TOC  Readmission Risk Interventions   No data  to display

## 2022-09-09 LAB — GLUCOSE, CAPILLARY
Glucose-Capillary: 138 mg/dL — ABNORMAL HIGH (ref 70–99)
Glucose-Capillary: 210 mg/dL — ABNORMAL HIGH (ref 70–99)
Glucose-Capillary: 269 mg/dL — ABNORMAL HIGH (ref 70–99)
Glucose-Capillary: 325 mg/dL — ABNORMAL HIGH (ref 70–99)

## 2022-09-09 MED ORDER — GUAIFENESIN ER 600 MG PO TB12
1200.0000 mg | ORAL_TABLET | Freq: Two times a day (BID) | ORAL | Status: DC
  Administered 2022-09-09 – 2022-09-10 (×2): 1200 mg via ORAL
  Filled 2022-09-09 (×2): qty 2

## 2022-09-09 MED ORDER — METFORMIN HCL 500 MG PO TABS
500.0000 mg | ORAL_TABLET | Freq: Two times a day (BID) | ORAL | Status: DC
  Administered 2022-09-10: 500 mg via ORAL
  Filled 2022-09-09 (×2): qty 1

## 2022-09-09 MED ORDER — GLIPIZIDE 5 MG PO TABS
2.5000 mg | ORAL_TABLET | Freq: Two times a day (BID) | ORAL | Status: DC
  Administered 2022-09-09 – 2022-09-10 (×4): 2.5 mg via ORAL
  Filled 2022-09-09 (×4): qty 1

## 2022-09-09 NOTE — Inpatient Diabetes Management (Signed)
Inpatient Diabetes Program Recommendations  AACE/ADA: New Consensus Statement on Inpatient Glycemic Control (2015)  Target Ranges:  Prepandial:   less than 140 mg/dL      Peak postprandial:   less than 180 mg/dL (1-2 hours)      Critically ill patients:  140 - 180 mg/dL    Latest Reference Range & Units 09/07/22 04:41  Hemoglobin A1C 4.8 - 5.6 % 8.8 (H)  (H): Data is abnormally high  Latest Reference Range & Units 09/08/22 07:20 09/08/22 11:11 09/08/22 16:42 09/08/22 19:14  Glucose-Capillary 70 - 99 mg/dL 211 (H)  6 units Novolog  10 units Semglee '@0838'$   180 (H) 180 (H)  5 units Novolog  380 (H)  12 units Novolog  459 (H)  6 units Novolog '@2305'$   (H): Data is abnormally high   Current Orders: Semglee 10 units Daily  Novolog 0-9 units TID ac/hs  Novolog 3 units TID with meals Glipizide 2.5 mg BID Metformin 500 mg BID         Getting Prednisone 40 mg Daily        MD- Note Current A1c= 8.8%--No Diabetes Meds at home   Consider starting Metformin for home?  500 mg BID to start (GFR >60 and Creatinine 0.72)  Note Glipizide and Metformin both started this AM    --Will follow patient during hospitalization--  Wyn Quaker RN, MSN, [place] Diabetes Coordinator Inpatient Glycemic Control Team Team Pager: [phone] (8a-5p)

## 2022-09-09 NOTE — Progress Notes (Signed)
PROGRESS NOTE  Katherine Douglas  DOB: Feb 13, 1981  PCP: [facility] [place] [id number]  DOA: 09/06/2022  LOS: 1 day  Hospital Day: 4  Brief narrative: Katherine Douglas is a 41 y.o. female with PMH significant for DM2, HTN, COPD, smokes about half pack per day for last 20 years, anxiety/depression, marijuana use 12/12, patient was brought to the ED from home by EMS for 3 days history of progressive shortness of breath, productive cough, chest pain, wheezing not responding to her regular inhalers.  EMS noted O2 sat low at 84% on room air. En route to the hospital, she was given epinephrine, SoluMedrol, magnesium, and DuoNeb.  In the ED, patient was tachycardic and tachypneic but afebrile and not hypotensive.  Labs showed WBC count 13.2, platelet count 461k (chronic mild elevation), glucose 183 COVID and influenza PCR negative VBG showed pH 7.39 and pCO2 61,  troponin negative x 2, BNP normal, lactic acid normal blood cultures drawn.   Chest xray not suggestive of pneumonia.  Patient was given IV ceftriaxone, azithromycin, IV Solu-Medrol, DuoNeb Admitted to [facility]   Subjective: Patient was seen and examined this morning.   Patient complains of Persistent shortness of breath.  She has significant wheezing and cough on auscultation with deep breathing.   Although the data is maintained on ambulation but patient feels winded.  Does not feel ready to go home today.    Assessment and plan: Acute exacerbation of COPD  Acute respiratory failure with hypoxia Chronic compensated hypercapnic respiratory acidosis Chronic everyday smoker Presented with progressively worsening respiratory symptoms, hypoxia, no fever. Chest x-ray without evidence of pneumonia Currently presumptively covered on IV Rocephin.  Also on oral prednisone 40 mg daily. Continue wheezing and cough on deep breathing.  Double dose of Mucinex.  Encourage incentive spirometry, flutter valve Encourage  ambulation. Counseled to quit smoking.  Nicotine patch offered   Sinus tachycardia Heart rate currently in the 110s.  Patient reports he is always tachycardic.  Chart reviewed.  It seems she had an echocardiogram done in 2021 for sinus tachycardia and hypertensive urgency.  It showed EF of 55 to 60%, no WMA, moderate concentric LVH, normal right ventricular pressure and function. TSH normal. In the last 48 hours, heart rate is improving on atenolol.   Because of persistent shortness of breath, I would repeat echocardiogram. Avoid albuterol.  Xopenex ordered  Anxiety/depression Patient seems to have uncontrolled anxiety is the main driver of her smoking and uncontrolled weight.  She states she was tried on different antianxiety pills including Valium by her PCP but she got frustrated and stopped by self.  Currently on low-dose Xanax in the hospital.  Need to follow-up with PCP/mental provider post discharge for long-term management    QT prolongation Initial EKG with QTc 547.  Electrolytes within normal range. Recent Labs  Lab 09/06/22 2149 09/07/22 0010  K 3.5  --   MG  --  2.2   Type 2 diabetes mellitus Hyperglycemia due to steroids A1c 8.8 on 09/07/2022 which is higher than 7.1 in 2021.   PTA not on meds. Blood sugar running elevated in the hospital because of steroids. Diabetes care coordinator consult appreciated.  Currently on Semglee 10 units this morning, along with NovoLog 3 units 3 times daily with meals.  Continue sliding scale insulin with Accu-Cheks I also started on metformin glipizide this morning. Recent Labs  Lab 09/08/22 1111 09/08/22 1642 09/08/22 1914 09/09/22 0734 09/09/22 1216  GLUCAP 180*  180* 380* 459* 138* 210*  Hypertension Continue atenolol and chlorthalidone  Morbid obesity  -Body mass index is 54.72 kg/m. Patient has been advised to make an attempt to improve diet and exercise patterns to aid in weight loss.   Goals of care   Code Status:  Full Code   Mobility: Encourage ambulation.  Check ambulatory oxygen requirement  Scheduled Meds:  atenolol  50 mg Oral Daily   And   chlorthalidone  25 mg Oral Daily   enoxaparin (LOVENOX) injection  40 mg Subcutaneous Q24H   glipiZIDE  2.5 mg Oral BID AC   guaiFENesin  1,200 mg Oral BID   insulin aspart  0-5 Units Subcutaneous QHS   insulin aspart  0-9 Units Subcutaneous TID WC   insulin aspart  3 Units Subcutaneous TID WC   insulin glargine-yfgn  10 Units Subcutaneous Daily   ipratropium  0.5 mg Nebulization Q6H WA   levalbuterol  0.63 mg Nebulization Q6H WA   metFORMIN  500 mg Oral BID WC   nicotine  14 mg Transdermal Daily   pneumococcal 23 valent vaccine  0.5 mL Intramuscular Tomorrow-1000   predniSONE  40 mg Oral Q breakfast    PRN meds: acetaminophen **OR** acetaminophen, ALPRAZolam, guaiFENesin-dextromethorphan, levalbuterol, melatonin, oxyCODONE   Infusions:   cefTRIAXone (ROCEPHIN)  IV 1 g (09/08/22 2145)    Skin assessment:     Nutritional status:  Body mass index is 54.72 kg/m.          Diet:  Diet Order             Diet Carb Modified Fluid consistency: Thin; Room service appropriate? Yes  Diet effective now                   DVT prophylaxis:  enoxaparin (LOVENOX) injection 40 mg Start: 09/07/22 1000   Antimicrobials: IV Rocephin Fluid: Not on IV fluid Consultants: None Family Communication: None at bedside  Status is: Observation  Continue inhospital care because: Remains wheezy.  Continues to complain of dyspnea Level of care: Telemetry   Dispo: The patient is from: Home              Anticipated d/c is to: Hopefully home in 1 to 2 days              Patient currently is not medically stable to d/c.   Difficult to place patient No    Antimicrobials: Anti-infectives (From admission, onward)    Start     Dose/Rate Route Frequency Ordered Stop   09/06/22 2245  azithromycin (ZITHROMAX) 500 mg in sodium chloride 0.9 % 250 mL IVPB   Status:  Discontinued        500 mg 250 mL/hr over 60 Minutes Intravenous Every 24 hours 09/06/22 2231 09/07/22 0308   09/06/22 2245  cefTRIAXone (ROCEPHIN) 1 g in sodium chloride 0.9 % 100 mL IVPB        1 g 200 mL/hr over 30 Minutes Intravenous Every 24 hours 09/06/22 2231         Objective: Vitals:   09/09/22 0836 09/09/22 0852  BP:    Pulse: 98   Resp:  (!) 28  Temp:    SpO2: 93% 93%    Intake/Output Summary (Last 24 hours) at 09/09/2022 1401 Last data filed at 09/08/2022 2145 Gross per 24 hour  Intake 580 ml  Output --  Net 580 ml   Filed Weights   09/09/22 0950  Weight: 135.7 kg   Weight change:  Body mass index is 54.[place]  kg/m.   Physical Exam: General exam: Pleasant, morbidly obese African-American female Skin: No rashes, lesions or ulcers. HEENT: Atraumatic, normocephalic, no obvious bleeding Lungs: Continues to have diffuse mild wheezing, coughs violently on deep breathing.  Gets easily short of breath on ambulation easily CVS: Sinus tachycardia, no murmur GI/Abd soft, nontender, distended from obesity, bowel present CNS: Alert, awake, oriented x 3 Psychiatry: Mood appropriate Extremities: No pedal edema, no calf tenderness  Data Review: I have personally reviewed the laboratory data and studies available.  F/u labs ordered Unresulted Labs (From admission, onward)    None       Signed, Terrilee Croak, MD [facility] 09/09/2022

## 2022-09-09 NOTE — TOC Initial Note (Signed)
,Transition of Care [facility]) - Initial/Assessment Note    Patient Details  Name: Katherine Douglas MRN: [id number] Date of Birth: 1981/04/25  Transition of Care [facility]) CM/SW Contact:    Henrietta Dine, RN Phone Number: [phone] 09/09/2022, 12:50 PM  Clinical Narrative:                 [facility] consult for medication assistance; spoke w/ pt in room; the pt says she is currently living w/ her mother because she is waiting for her new apartment to get inspected by [place]; the pt says she does not have glasses, hearing aids, DME or Pillager services; the pt says she has transportation; she says she does smoke marijuana and cigarettes; the pt says she gets food stamps and food/nutrition; 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   Expected Discharge Plan: Home/Self Care Barriers to Discharge: Continued Medical Work up   Patient Goals and CMS Choice Patient states their goals for this hospitalization and ongoing recovery are:: home CMS Medicare.gov Compare Post Acute Care list provided to:: Patient    Expected Discharge Plan and Services Expected Discharge Plan: Home/Self Care   Discharge Planning Services: CM Consult   Living arrangements for the past 2 months: Single Family Home                                      Prior Living Arrangements/Services Living arrangements for the past 2 months: Single Family Home Lives with:: Relatives Patient language and need  for interpreter reviewed:: Yes Do you feel safe going back to the place where you live?: Yes      Need for Family Participation in Patient Care: Yes (Comment) Care giver support system in place?: Yes (comment)   Criminal Activity/Legal Involvement Pertinent to Current Situation/Hospitalization: No - Comment as needed  Activities of Daily Living Home Assistive Devices/Equipment: None ADL Screening (condition at time of admission) Patient's cognitive ability adequate to safely complete daily activities?: Yes Is the patient deaf or have difficulty hearing?: No Does the patient have difficulty seeing, even when wearing glasses/contacts?: No Does the patient have difficulty concentrating, remembering, or making decisions?: No Patient able to express need for assistance with ADLs?: Yes Does the patient have difficulty dressing or bathing?: No Independently performs ADLs?: Yes (appropriate for developmental age) Does the patient have difficulty walking or climbing stairs?: No Weakness of Legs: None Weakness of Arms/Hands: None  Permission Sought/Granted Permission sought to share information with : Case Manager Permission granted to share information with : Yes, Verbal Permission Granted  Share Information with NAME: Lenor Coffin, RN, CM           Emotional Assessment Appearance:: Appears stated age Attitude/Demeanor/Rapport: Gracious Affect (typically observed): Accepting Orientation: : Oriented to Self, Oriented to Place, Oriented to  Time, Oriented to Situation Alcohol / Substance Use: Tobacco Use, Illicit Drugs Psych Involvement: No (comment)  Admission diagnosis:  COPD with acute exacerbation ([place]) [J44.1] Dyspnea, unspecified type [R06.00] Patient Active Problem List   Diagnosis Date Noted   COPD with acute exacerbation ([place]) 09/07/2022   Acute hypoxemic respiratory failure ([place]) 09/07/2022   QT prolongation 09/07/2022   Type 2 diabetes mellitus ([place]) 09/07/2022    Essential hypertension 01/13/2020   Leukocytosis 01/12/2020   Hyperglycemia 01/12/2020   Anxiety 01/12/2020   Asthma exacerbation 01/11/2020   Bronchitis due to tobacco use 01/11/2020   Tobacco abuse 01/11/2020   Hypertensive urgency 01/11/2020   Sinus tachycardia 01/11/2020   Morbid obesity ([place]) 07/05/2017   Multiparity 04/14/2015   PCP:  [facility] [place]:   [place], [place] - [address] [place] AT [facility] [place] [address] Phone: [phone] Fax: [phone]     Social Determinants of Health (SDOH) Interventions Housing Interventions: Inpatient TOC (attached shelterResources)  Readmission Risk Interventions     No data to display

## 2022-09-09 NOTE — TOC Progression Note (Signed)
Transition of Care [facility]) - Progression Note    Patient Details  Name: Katherine Douglas MRN: [id number] Date of Birth: Jun 28, 1981  Transition of Care [facility]) CM/SW Contact  Henrietta Dine, RN Phone Number: [phone] 09/09/2022, 12:00 PM  Clinical Narrative:     TOC order received for Heart Failure Home Health Screening. Patient has been reviewed and does not meet criteria.    Expected Discharge Plan: Home/Self Care Barriers to Discharge: Continued Medical Work up  Expected Discharge Plan and Services Expected Discharge Plan: Home/Self Care   Discharge Planning Services: CM Consult   Living arrangements for the past 2 months: Single Family Home                                       Social Determinants of Health (SDOH) Interventions Housing Interventions: Inpatient TOC (attached shelterResources)  Readmission Risk Interventions     No data to display

## 2022-09-10 ENCOUNTER — Inpatient Hospital Stay (HOSPITAL_COMMUNITY): Payer: Medicaid Other

## 2022-09-10 DIAGNOSIS — R0602 Shortness of breath: Secondary | ICD-10-CM

## 2022-09-10 DIAGNOSIS — R9431 Abnormal electrocardiogram [ECG] [EKG]: Secondary | ICD-10-CM

## 2022-09-10 LAB — ECHOCARDIOGRAM COMPLETE
Area-P 1/2: 5.06 cm2
Calc EF: 64.2 %
Height: 62 in
S' Lateral: 2.8 cm
Single Plane A2C EF: 69 %
Single Plane A4C EF: 59 %
Weight: 4787.2 oz

## 2022-09-10 LAB — COMPREHENSIVE METABOLIC PANEL
ALT: 39 U/L (ref 0–44)
AST: 28 U/L (ref 15–41)
Albumin: 3.3 g/dL — ABNORMAL LOW (ref 3.5–5.0)
Alkaline Phosphatase: 86 U/L (ref 38–126)
Anion gap: 9 (ref 5–15)
BUN: 19 mg/dL (ref 6–20)
CO2: 32 mmol/L (ref 22–32)
Calcium: 9.1 mg/dL (ref 8.9–10.3)
Chloride: 97 mmol/L — ABNORMAL LOW (ref 98–111)
Creatinine, Ser: 1.03 mg/dL — ABNORMAL HIGH (ref 0.44–1.00)
GFR, Estimated: >60 mL/min (ref >60)
Glucose, Bld: 230 mg/dL — ABNORMAL HIGH (ref 70–99)
Potassium: 3.4 mmol/L — ABNORMAL LOW (ref 3.5–5.1)
Sodium: 138 mmol/L (ref 135–145)
Total Bilirubin: 0.5 mg/dL (ref 0.3–1.2)
Total Protein: 6.8 g/dL (ref 6.5–8.1)

## 2022-09-10 LAB — CBC
HCT: 40.9 % (ref 36.0–46.0)
Hemoglobin: 12.6 g/dL (ref 12.0–15.0)
MCH: 26.4 pg (ref 26.0–34.0)
MCHC: 30.8 g/dL (ref 30.0–36.0)
MCV: 85.6 fL (ref 80.0–100.0)
Platelets: 468 10*3/uL — ABNORMAL HIGH (ref 150–400)
RBC: 4.78 MIL/uL (ref 3.87–5.11)
RDW: 15.7 % — ABNORMAL HIGH (ref 11.5–15.5)
WBC: 20.1 10*3/uL — ABNORMAL HIGH (ref 4.0–10.5)
nRBC: 0 % (ref 0.0–0.2)

## 2022-09-10 LAB — GLUCOSE, CAPILLARY
Glucose-Capillary: 176 mg/dL — ABNORMAL HIGH (ref 70–99)
Glucose-Capillary: 193 mg/dL — ABNORMAL HIGH (ref 70–99)

## 2022-09-10 MED ORDER — CEFDINIR 300 MG PO CAPS: 300.0000 mg | ORAL_CAPSULE | Freq: Two times a day (BID) | ORAL | 0 refills | Status: DC

## 2022-09-10 MED ORDER — LEVALBUTEROL TARTRATE 45 MCG/ACT IN AERO: 1.0000 | INHALATION_SPRAY | RESPIRATORY_TRACT | 2 refills | Status: DC | PRN

## 2022-09-10 MED ORDER — BENZONATATE 100 MG PO CAPS
100.0000 mg | ORAL_CAPSULE | Freq: Once | ORAL | Status: AC
  Administered 2022-09-10: 100 mg via ORAL
  Filled 2022-09-10: qty 1

## 2022-09-10 MED ORDER — POTASSIUM CHLORIDE CRYS ER 20 MEQ PO TBCR
40.0000 meq | EXTENDED_RELEASE_TABLET | Freq: Once | ORAL | Status: AC
  Administered 2022-09-10: 40 meq via ORAL
  Filled 2022-09-10: qty 2

## 2022-09-10 MED ORDER — GUAIFENESIN ER 600 MG PO TB12: 1200.0000 mg | ORAL_TABLET | Freq: Two times a day (BID) | ORAL | 0 refills | Status: DC

## 2022-09-10 MED ORDER — HYDROCORTISONE 0.5 % EX CREA
TOPICAL_CREAM | CUTANEOUS | Status: DC | PRN
  Filled 2022-09-10: qty 28.35

## 2022-09-10 MED ORDER — SITAGLIPTIN PHOSPHATE 25 MG PO TABS: 25.0000 mg | ORAL_TABLET | Freq: Every day | ORAL | 2 refills | Status: DC

## 2022-09-10 MED ORDER — PREDNISONE 10 MG PO TABS: ORAL_TABLET | ORAL | 0 refills | Status: DC

## 2022-09-10 MED ORDER — SACCHAROMYCES BOULARDII 250 MG PO CAPS: 250.0000 mg | ORAL_CAPSULE | Freq: Two times a day (BID) | ORAL | 0 refills | Status: DC

## 2022-09-10 MED ORDER — GLIPIZIDE 5 MG PO TABS: 5.0000 mg | ORAL_TABLET | Freq: Every day | ORAL | 2 refills | Status: DC

## 2022-09-10 MED ORDER — ALPRAZOLAM 0.5 MG PO TABS: 0.5000 mg | ORAL_TABLET | Freq: Three times a day (TID) | ORAL | 0 refills | Status: DC | PRN

## 2022-09-10 NOTE — Progress Notes (Signed)
  Echocardiogram 2D Echocardiogram has been performed.  Bobbye Charleston 09/10/2022, 9:26 AM

## 2022-09-10 NOTE — Plan of Care (Signed)
  Problem: Education: Goal: Knowledge of disease or condition will improve Outcome: Completed/Met Goal: Knowledge of the prescribed therapeutic regimen will improve Outcome: Completed/Met Goal: Individualized Educational Video(s) Outcome: Completed/Met

## 2022-09-10 NOTE — Discharge Summary (Signed)
Physician Discharge Summary  Katherine Douglas [id number] DOB: 05/04/1981 DOA: 09/06/2022  PCP: [facility]  Admit date: 09/06/2022 Discharge date: 09/10/2022  Admitted From: home Discharge disposition: home  Recommendations at discharge:  5 days of Omnicef with probiotics, tapering course of prednisone, Mucinex Stop smoking You been started on glipizide and Januvia for diabetes control Limited supply of Xanax for anxiety control Follow-up with PCP for further management   Brief narrative: Katherine Douglas is a 41 y.o. female with PMH significant for DM2, HTN, COPD, smokes about half pack per day for last 20 years, anxiety/depression, marijuana use 12/12, patient was brought to the ED from home by EMS for 3 days history of progressive shortness of breath, productive cough, chest pain, wheezing not responding to her regular inhalers.  EMS noted O2 sat low at 84% on room air. En route to the hospital, she was given epinephrine, SoluMedrol, magnesium, and DuoNeb.  In the ED, patient was tachycardic and tachypneic but afebrile and not hypotensive.  Labs showed WBC count 13.2, platelet count 461k (chronic mild elevation), glucose 183 COVID and influenza PCR negative VBG showed pH 7.39 and pCO2 61,  troponin negative x 2, BNP normal, lactic acid normal blood cultures drawn.   Chest xray not suggestive of pneumonia.  Patient was given IV ceftriaxone, azithromycin, IV Solu-Medrol, DuoNeb Admitted to [facility]   Subjective: Patient was seen and examined this morning.   Lying on bed.  States 'I can't breath' while CVs DuoNeb request without oxygen. On auscultation, she has end expiratory wheezing.  Still has cough on deep breathing.  Able to ambulate in the hallway without supplemental oxygen. I repeated a chest x-ray this afternoon which did not show any new finding.  Assessment and plan: Acute exacerbation of COPD  Acute respiratory failure with hypoxia Chronic  compensated hypercapnic respiratory acidosis Chronic everyday smoker Presented with progressively worsening respiratory symptoms, hypoxia, no fever. Chest x-ray without evidence of pneumonia Currently presumptively covered on IV Rocephin.  Also on oral prednisone 40 mg daily. Wheezing improving.  Cough persist but gradually improving. Continue Mucinex, encourage incentive spirometry. Counseled to quit smoking.  Nicotine patch offered WBC Count elevated because of steroids. X-ray repeated this afternoon did not show any finding Will discharge the patient on 5-day course of oral Omnicef with probiotics, tapering course of prednisone, Mucinex. Recent Labs  Lab 09/06/22 2149 09/07/22 0030 09/07/22 0441 09/10/22 1054  WBC 13.2*  --  14.9* 20.1*  LATICACIDVEN  --  1.9  --   --    Sinus tachycardia Heart rate currently in the 110s.  Patient reports he is always tachycardic.  Chronic tachycardia was confirmed on chart review.  TSH normal. In the last 48 hours, heart rate is improving on atenolol.   12/15, echocardiogram showed EF of 60 to 65% Avoid albuterol.  Xopenex ordered  Anxiety/depression Patient seems to have uncontrolled anxiety which is the main driver of her smoking and uncontrolled weight.  She states she was tried on different antianxiety pills including Valium by her PCP but she got frustrated and stopped the meds by self.  Currently on low-dose Xanax in the hospital.  Will discharge on few days of Xanax. Need to follow-up with PCP/mental provider post discharge for long-term management    QT prolongation Hypokalemia Initial EKG with QTc 547.  Potassium level low at 3.4 today.  Replacement given. Recent Labs  Lab 09/06/22 2149 09/07/22 0010 09/10/22 1054  K 3.5  --  3.4*  MG  --  2.2  --    Type 2 diabetes mellitus Hyperglycemia due to steroids A1c 8.8 on 09/07/2022 which is higher than 7.1 in 2021.   PTA not on meds.  She states she used to be on metformin in the  past but could not continue that because of GI side effects. Blood sugar running elevated in the hospital because of steroids. Diabetes care coordinator consult appreciated.  Currently on Semglee 10 units this morning, along with NovoLog 3 units 3 times daily with meals.  She is not sure if she would like to continue insulin at home.  High risk of noncompliance as well as adverse effect from any consistent use. Will discharge her on glipizide and Januvia.  Recommend diet control, weight loss.  Follow-up with PCP in next few months for repeat A1c.  Hypertension Continue atenolol, lisinopril and chlorthalidone  Morbid obesity  -Body mass index is 54.72 kg/m. Patient has been advised to make an attempt to improve diet and exercise patterns to aid in weight loss.    Goals of care   Code Status: Full Code   Mobility: Encourage ambulation.  Check ambulatory oxygen requirement  Scheduled Meds:  atenolol  50 mg Oral Daily   And   chlorthalidone  25 mg Oral Daily   enoxaparin (LOVENOX) injection  40 mg Subcutaneous Q24H   glipiZIDE  2.5 mg Oral BID AC   guaiFENesin  1,200 mg Oral BID   insulin aspart  0-5 Units Subcutaneous QHS   insulin aspart  0-9 Units Subcutaneous TID WC   insulin aspart  3 Units Subcutaneous TID WC   insulin glargine-yfgn  10 Units Subcutaneous Daily   ipratropium  0.5 mg Nebulization Q6H WA   levalbuterol  0.63 mg Nebulization Q6H WA   nicotine  14 mg Transdermal Daily   potassium chloride  40 mEq Oral Once   predniSONE  40 mg Oral Q breakfast    PRN meds: acetaminophen **OR** acetaminophen, ALPRAZolam, guaiFENesin-dextromethorphan, hydrocortisone cream, levalbuterol, melatonin, oxyCODONE   Infusions:   cefTRIAXone (ROCEPHIN)  IV 200 mL/hr at 09/10/22 0007    Skin assessment:     Nutritional status:  Body mass index is 54.72 kg/m.          Diet:  Diet Order             Diet Carb Modified           Diet Carb Modified Fluid consistency: Thin;  Room service appropriate? Yes  Diet effective now                   DVT prophylaxis:  enoxaparin (LOVENOX) injection 40 mg Start: 09/07/22 1000   Antimicrobials: IV Rocephin Fluid: Not on IV fluid Consultants: None Family Communication: None at bedside  Status is: Observation  Continue inhospital care because: Remains wheezy.  Continues to complain of dyspnea Level of care: Telemetry   Dispo: The patient is from: Home              Anticipated d/c is to: Hopefully home in 1 to 2 days              Patient currently is not medically stable to d/c.   Difficult to place patient No    Antimicrobials: Anti-infectives (From admission, onward)    Start     Dose/Rate Route Frequency Ordered Stop   09/10/22 0000  cefdinir (OMNICEF) 300 MG capsule        300 mg Oral 2 times daily 09/10/22 1655  09/15/22 2359   09/06/22 2245  azithromycin (ZITHROMAX) 500 mg in sodium chloride 0.9 % 250 mL IVPB  Status:  Discontinued        500 mg 250 mL/hr over 60 Minutes Intravenous Every 24 hours 09/06/22 2231 09/07/22 0308   09/06/22 2245  cefTRIAXone (ROCEPHIN) 1 g in sodium chloride 0.9 % 100 mL IVPB        1 g 200 mL/hr over 30 Minutes Intravenous Every 24 hours 09/06/22 2231         Objective: Vitals:   09/10/22 1311 09/10/22 1417  BP: (!) 145/105   Pulse: 82   Resp: 18   Temp: 98.6 F (37 C)   SpO2: 98% 98%    Intake/Output Summary (Last 24 hours) at 09/10/2022 1656 Last data filed at 09/10/2022 1300 Gross per 24 hour  Intake 439.58 ml  Output --  Net 439.58 ml   Filed Weights   09/09/22 0950  Weight: 135.7 kg   Weight change:  Body mass index is 54.72 kg/m.   Physical Exam: General exam: Pleasant, morbidly obese African-American female Skin: No rashes, lesions or ulcers. HEENT: Atraumatic, normocephalic, no obvious bleeding Lungs: Continues to have diffuse mild wheezing, coughs violently on deep breathing.  Gets easily short of breath on ambulation easily CVS:  Sinus tachycardia, no murmur GI/Abd soft, nontender, distended from obesity, bowel present CNS: Alert, awake, oriented x 3 Psychiatry: Mood appropriate Extremities: No pedal edema, no calf tenderness  Data Review: I have personally reviewed the laboratory data and studies available.  F/u labs ordered Unresulted Labs (From admission, onward)    None       Signed, Terrilee Croak, MD [facility] 09/10/2022

## 2022-09-12 ENCOUNTER — Other Ambulatory Visit: Payer: Self-pay | Admitting: Internal Medicine

## 2022-09-12 LAB — CULTURE, BLOOD (ROUTINE X 2)
Culture: NO GROWTH
Culture: NO GROWTH
Special Requests: ADEQUATE

## 2022-09-12 MED ORDER — SITAGLIPTIN PHOSPHATE 25 MG PO TABS: 25.0000 mg | ORAL_TABLET | Freq: Every day | ORAL | 2 refills | Status: DC

## 2022-09-12 MED ORDER — ALPRAZOLAM 0.5 MG PO TABS: 0.5000 mg | ORAL_TABLET | Freq: Three times a day (TID) | ORAL | 0 refills | Status: AC | PRN

## 2022-09-12 MED ORDER — GLIPIZIDE 5 MG PO TABS: 5.0000 mg | ORAL_TABLET | Freq: Every day | ORAL | 2 refills | Status: DC

## 2022-09-12 MED ORDER — CEFDINIR 300 MG PO CAPS: 300.0000 mg | ORAL_CAPSULE | Freq: Two times a day (BID) | ORAL | 0 refills | Status: AC

## 2022-09-12 MED ORDER — GUAIFENESIN ER 600 MG PO TB12: 1200.0000 mg | ORAL_TABLET | Freq: Two times a day (BID) | ORAL | 0 refills | Status: AC

## 2022-09-12 MED ORDER — SACCHAROMYCES BOULARDII 250 MG PO CAPS: 250.0000 mg | ORAL_CAPSULE | Freq: Two times a day (BID) | ORAL | 0 refills | Status: AC

## 2022-09-12 MED ORDER — LEVALBUTEROL TARTRATE 45 MCG/ACT IN AERO: 1.0000 | INHALATION_SPRAY | RESPIRATORY_TRACT | 2 refills | Status: DC | PRN

## 2022-09-12 MED ORDER — PREDNISONE 10 MG PO TABS: ORAL_TABLET | ORAL | 0 refills | Status: DC

## 2022-09-12 NOTE — Progress Notes (Signed)
Patient was discharged yesterday. Meds were sent to [facility]. Called in today and asked to send the dc meds to [facility].  I sent the new scripts to [facility]. RN made sure the [facility] cancelled the prior scripts.

## 2022-09-28 ENCOUNTER — Inpatient Hospital Stay (HOSPITAL_COMMUNITY)
Admission: EM | Admit: 2022-09-28 | Discharge: 2022-10-03 | DRG: 193 | Disposition: A | Discharge status: Home / Self Care | Payer: Medicaid Other | Attending: Family Medicine | Admitting: Family Medicine

## 2022-09-28 ENCOUNTER — Other Ambulatory Visit: Payer: Self-pay

## 2022-09-28 ENCOUNTER — Encounter (HOSPITAL_COMMUNITY): Payer: Self-pay

## 2022-09-28 ENCOUNTER — Emergency Department (HOSPITAL_COMMUNITY): Payer: Medicaid Other

## 2022-09-28 DIAGNOSIS — G4733 Obstructive sleep apnea (adult) (pediatric): Secondary | ICD-10-CM | POA: Diagnosis present

## 2022-09-28 DIAGNOSIS — J101 Influenza due to other identified influenza virus with other respiratory manifestations: Secondary | ICD-10-CM | POA: Diagnosis not present

## 2022-09-28 DIAGNOSIS — G43909 Migraine, unspecified, not intractable, without status migrainosus: Secondary | ICD-10-CM | POA: Diagnosis not present

## 2022-09-28 DIAGNOSIS — J4489 Other specified chronic obstructive pulmonary disease: Secondary | ICD-10-CM | POA: Diagnosis present

## 2022-09-28 DIAGNOSIS — E876 Hypokalemia: Secondary | ICD-10-CM | POA: Diagnosis present

## 2022-09-28 DIAGNOSIS — J09X1 Influenza due to identified novel influenza A virus with pneumonia: Secondary | ICD-10-CM | POA: Diagnosis present

## 2022-09-28 DIAGNOSIS — Z833 Family history of diabetes mellitus: Secondary | ICD-10-CM

## 2022-09-28 DIAGNOSIS — E1165 Type 2 diabetes mellitus with hyperglycemia: Secondary | ICD-10-CM | POA: Diagnosis present

## 2022-09-28 DIAGNOSIS — J441 Chronic obstructive pulmonary disease with (acute) exacerbation: Secondary | ICD-10-CM

## 2022-09-28 DIAGNOSIS — Z713 Dietary counseling and surveillance: Secondary | ICD-10-CM

## 2022-09-28 DIAGNOSIS — R0602 Shortness of breath: Secondary | ICD-10-CM

## 2022-09-28 DIAGNOSIS — F40240 Claustrophobia: Secondary | ICD-10-CM | POA: Diagnosis present

## 2022-09-28 DIAGNOSIS — Z8249 Family history of ischemic heart disease and other diseases of the circulatory system: Secondary | ICD-10-CM

## 2022-09-28 DIAGNOSIS — I10 Essential (primary) hypertension: Secondary | ICD-10-CM | POA: Diagnosis present

## 2022-09-28 DIAGNOSIS — F32A Depression, unspecified: Secondary | ICD-10-CM | POA: Diagnosis present

## 2022-09-28 DIAGNOSIS — E871 Hypo-osmolality and hyponatremia: Secondary | ICD-10-CM | POA: Diagnosis present

## 2022-09-28 DIAGNOSIS — Z1152 Encounter for screening for COVID-19: Secondary | ICD-10-CM

## 2022-09-28 DIAGNOSIS — F410 Panic disorder [episodic paroxysmal anxiety] without agoraphobia: Secondary | ICD-10-CM | POA: Diagnosis present

## 2022-09-28 DIAGNOSIS — Z6841 Body Mass Index (BMI) 40.0 and over, adult: Secondary | ICD-10-CM

## 2022-09-28 DIAGNOSIS — J9601 Acute respiratory failure with hypoxia: Secondary | ICD-10-CM | POA: Diagnosis present

## 2022-09-28 DIAGNOSIS — R079 Chest pain, unspecified: Secondary | ICD-10-CM | POA: Diagnosis not present

## 2022-09-28 DIAGNOSIS — Z87891 Personal history of nicotine dependence: Secondary | ICD-10-CM

## 2022-09-28 NOTE — ED Triage Notes (Signed)
Pt reports with cough, SHOB, and wheezing x 2 days. Pt reports having COPD.

## 2022-09-28 NOTE — ED Notes (Signed)
Unable to collect labs

## 2022-09-28 NOTE — ED Provider Triage Note (Signed)
Emergency Medicine Provider Triage Evaluation Note  Katherine Douglas , a 42 y.o. female  was evaluated in triage.  Pt complains of shortness of breath, chest pain, nausea, diarrhea, URI symptoms.  Patient reports symptoms began 2 days ago.  The patient denies any sick contacts.  Patient states she was recently discharged in the hospital she was Due to low oxygen levels and hypertension.  Patient states that she has COPD, denies smoking however states that her last cigarette was on Christmas.  Patient endorsing fevers, generalized bodyaches and chills at home.  Review of Systems  Positive:  Negative:   Physical Exam  LMP 08/26/2022  Gen:   Awake, no distress   Resp:  Normal effort  MSK:   Moves extremities without difficulty  Other:  Wheezing throughout  Medical Decision Making  Medically screening exam initiated at 11:12 PM.  Appropriate orders placed.  [name] was informed that the remainder of the evaluation will be completed by another provider, this initial triage assessment does not replace that evaluation, and the importance of remaining in the ED until their evaluation is complete.     Azucena Cecil, PA-C 09/28/22 2313

## 2022-09-29 ENCOUNTER — Encounter (HOSPITAL_COMMUNITY): Payer: Self-pay | Admitting: Emergency Medicine

## 2022-09-29 ENCOUNTER — Emergency Department (HOSPITAL_COMMUNITY): Payer: Medicaid Other

## 2022-09-29 DIAGNOSIS — Z87891 Personal history of nicotine dependence: Secondary | ICD-10-CM | POA: Diagnosis not present

## 2022-09-29 DIAGNOSIS — J9601 Acute respiratory failure with hypoxia: Secondary | ICD-10-CM | POA: Diagnosis not present

## 2022-09-29 DIAGNOSIS — I10 Essential (primary) hypertension: Secondary | ICD-10-CM | POA: Diagnosis present

## 2022-09-29 DIAGNOSIS — G43909 Migraine, unspecified, not intractable, without status migrainosus: Secondary | ICD-10-CM | POA: Diagnosis not present

## 2022-09-29 DIAGNOSIS — R0602 Shortness of breath: Secondary | ICD-10-CM | POA: Diagnosis not present

## 2022-09-29 DIAGNOSIS — J441 Chronic obstructive pulmonary disease with (acute) exacerbation: Secondary | ICD-10-CM | POA: Diagnosis not present

## 2022-09-29 DIAGNOSIS — Z1152 Encounter for screening for COVID-19: Secondary | ICD-10-CM | POA: Diagnosis not present

## 2022-09-29 DIAGNOSIS — Z833 Family history of diabetes mellitus: Secondary | ICD-10-CM | POA: Diagnosis not present

## 2022-09-29 DIAGNOSIS — J09X1 Influenza due to identified novel influenza A virus with pneumonia: Secondary | ICD-10-CM | POA: Diagnosis present

## 2022-09-29 DIAGNOSIS — G4733 Obstructive sleep apnea (adult) (pediatric): Secondary | ICD-10-CM | POA: Diagnosis present

## 2022-09-29 DIAGNOSIS — F32A Depression, unspecified: Secondary | ICD-10-CM | POA: Diagnosis present

## 2022-09-29 DIAGNOSIS — F410 Panic disorder [episodic paroxysmal anxiety] without agoraphobia: Secondary | ICD-10-CM | POA: Diagnosis present

## 2022-09-29 DIAGNOSIS — F40240 Claustrophobia: Secondary | ICD-10-CM | POA: Diagnosis present

## 2022-09-29 DIAGNOSIS — R059 Cough, unspecified: Secondary | ICD-10-CM | POA: Diagnosis not present

## 2022-09-29 DIAGNOSIS — Z8249 Family history of ischemic heart disease and other diseases of the circulatory system: Secondary | ICD-10-CM | POA: Diagnosis not present

## 2022-09-29 DIAGNOSIS — J4489 Other specified chronic obstructive pulmonary disease: Secondary | ICD-10-CM | POA: Diagnosis present

## 2022-09-29 DIAGNOSIS — J101 Influenza due to other identified influenza virus with other respiratory manifestations: Secondary | ICD-10-CM | POA: Diagnosis not present

## 2022-09-29 DIAGNOSIS — E1165 Type 2 diabetes mellitus with hyperglycemia: Secondary | ICD-10-CM | POA: Diagnosis present

## 2022-09-29 DIAGNOSIS — Z713 Dietary counseling and surveillance: Secondary | ICD-10-CM | POA: Diagnosis not present

## 2022-09-29 DIAGNOSIS — E876 Hypokalemia: Secondary | ICD-10-CM | POA: Diagnosis present

## 2022-09-29 DIAGNOSIS — E871 Hypo-osmolality and hyponatremia: Secondary | ICD-10-CM | POA: Diagnosis present

## 2022-09-29 DIAGNOSIS — R062 Wheezing: Secondary | ICD-10-CM | POA: Diagnosis not present

## 2022-09-29 DIAGNOSIS — Z6841 Body Mass Index (BMI) 40.0 and over, adult: Secondary | ICD-10-CM | POA: Diagnosis not present

## 2022-09-29 DIAGNOSIS — R079 Chest pain, unspecified: Secondary | ICD-10-CM | POA: Diagnosis not present

## 2022-09-29 LAB — BASIC METABOLIC PANEL
Anion gap: 11 (ref 5–15)
BUN: 10 mg/dL (ref 6–20)
CO2: 26 mmol/L (ref 22–32)
Calcium: 8.7 mg/dL — ABNORMAL LOW (ref 8.9–10.3)
Chloride: 96 mmol/L — ABNORMAL LOW (ref 98–111)
Creatinine, Ser: 0.81 mg/dL (ref 0.44–1.00)
GFR, Estimated: >60 mL/min (ref >60)
Glucose, Bld: 283 mg/dL — ABNORMAL HIGH (ref 70–99)
Potassium: 3.7 mmol/L (ref 3.5–5.1)
Sodium: 133 mmol/L — ABNORMAL LOW (ref 135–145)

## 2022-09-29 LAB — TROPONIN I (HIGH SENSITIVITY)
Troponin I (High Sensitivity): 3 ng/L (ref <18)
Troponin I (High Sensitivity): 3 ng/L (ref <18)

## 2022-09-29 LAB — CBC
HCT: 39.7 % (ref 36.0–46.0)
Hemoglobin: 12.3 g/dL (ref 12.0–15.0)
MCH: 26.2 pg (ref 26.0–34.0)
MCHC: 31 g/dL (ref 30.0–36.0)
MCV: 84.6 fL (ref 80.0–100.0)
Platelets: 410 10*3/uL — ABNORMAL HIGH (ref 150–400)
RBC: 4.69 MIL/uL (ref 3.87–5.11)
RDW: 15.6 % — ABNORMAL HIGH (ref 11.5–15.5)
WBC: 8.9 10*3/uL (ref 4.0–10.5)
nRBC: 0 % (ref 0.0–0.2)

## 2022-09-29 LAB — RESP PANEL BY RT-PCR (RSV, FLU A&B, COVID)  RVPGX2
Influenza A by PCR: POSITIVE — AB
Influenza B by PCR: NEGATIVE
Resp Syncytial Virus by PCR: NEGATIVE
SARS Coronavirus 2 by RT PCR: NEGATIVE

## 2022-09-29 LAB — GLUCOSE, CAPILLARY
Glucose-Capillary: 357 mg/dL — ABNORMAL HIGH (ref 70–99)
Glucose-Capillary: 384 mg/dL — ABNORMAL HIGH (ref 70–99)
Glucose-Capillary: 456 mg/dL — ABNORMAL HIGH (ref 70–99)
Glucose-Capillary: 496 mg/dL — ABNORMAL HIGH (ref 70–99)

## 2022-09-29 LAB — CBG MONITORING, ED
Glucose-Capillary: 551 mg/dL (ref 70–99)
Glucose-Capillary: 518 mg/dL (ref 70–99)

## 2022-09-29 LAB — BRAIN NATRIURETIC PEPTIDE: B Natriuretic Peptide: 7.9 pg/mL (ref 0.0–100.0)

## 2022-09-29 LAB — MRSA NEXT GEN BY PCR, NASAL: MRSA by PCR Next Gen: NOT DETECTED

## 2022-09-29 MED ORDER — BUDESONIDE 0.5 MG/2ML IN SUSP
0.5000 mg | Freq: Two times a day (BID) | RESPIRATORY_TRACT | Status: DC
  Administered 2022-09-29 – 2022-10-03 (×8): 0.5 mg via RESPIRATORY_TRACT
  Filled 2022-09-29 (×8): qty 2

## 2022-09-29 MED ORDER — GLIPIZIDE 5 MG PO TABS
5.0000 mg | ORAL_TABLET | Freq: Every day | ORAL | Status: DC
  Administered 2022-09-29 – 2022-10-03 (×5): 5 mg via ORAL
  Filled 2022-09-29 (×5): qty 1

## 2022-09-29 MED ORDER — ATENOLOL 25 MG PO TABS
25.0000 mg | ORAL_TABLET | Freq: Every day | ORAL | Status: DC
  Administered 2022-09-29 – 2022-10-03 (×5): 25 mg via ORAL
  Filled 2022-09-29 (×5): qty 1

## 2022-09-29 MED ORDER — TIZANIDINE HCL 4 MG PO TABS
4.0000 mg | ORAL_TABLET | Freq: Three times a day (TID) | ORAL | Status: DC | PRN
  Administered 2022-09-29 – 2022-09-30 (×2): 4 mg via ORAL
  Filled 2022-09-29 (×2): qty 1

## 2022-09-29 MED ORDER — LORAZEPAM 2 MG/ML PO CONC
1.0000 mg | Freq: Once | ORAL | Status: DC
  Filled 2022-09-29: qty 0.5

## 2022-09-29 MED ORDER — IOHEXOL 350 MG/ML SOLN
100.0000 mL | Freq: Once | INTRAVENOUS | Status: AC | PRN
  Administered 2022-09-29: 100 mL via INTRAVENOUS

## 2022-09-29 MED ORDER — MOMETASONE FURO-FORMOTEROL FUM 200-5 MCG/ACT IN AERO: 1.0000 | INHALATION_SPRAY | Freq: Two times a day (BID) | RESPIRATORY_TRACT | Status: DC

## 2022-09-29 MED ORDER — INSULIN ASPART 100 UNIT/ML IJ SOLN
15.0000 [IU] | Freq: Once | INTRAMUSCULAR | Status: AC
  Administered 2022-09-29: 15 [IU] via SUBCUTANEOUS

## 2022-09-29 MED ORDER — INSULIN ASPART 100 UNIT/ML IJ SOLN
0.0000 [IU] | INTRAMUSCULAR | Status: DC
  Administered 2022-09-30: 3 [IU] via SUBCUTANEOUS
  Administered 2022-09-30 (×3): 11 [IU] via SUBCUTANEOUS
  Administered 2022-09-30: 15 [IU] via SUBCUTANEOUS
  Administered 2022-09-30: 5 [IU] via SUBCUTANEOUS
  Administered 2022-09-30: 11 [IU] via SUBCUTANEOUS
  Administered 2022-10-01: 5 [IU] via SUBCUTANEOUS
  Administered 2022-10-01: 3 [IU] via SUBCUTANEOUS
  Administered 2022-10-01: 15 [IU] via SUBCUTANEOUS
  Administered 2022-10-01: 8 [IU] via SUBCUTANEOUS
  Administered 2022-10-02: 2 [IU] via SUBCUTANEOUS
  Administered 2022-10-02: 11 [IU] via SUBCUTANEOUS
  Administered 2022-10-02: 8 [IU] via SUBCUTANEOUS
  Administered 2022-10-02: 11 [IU] via SUBCUTANEOUS
  Administered 2022-10-02 – 2022-10-03 (×2): 3 [IU] via SUBCUTANEOUS
  Administered 2022-10-03: 2 [IU] via SUBCUTANEOUS
  Administered 2022-10-03: 3 [IU] via SUBCUTANEOUS

## 2022-09-29 MED ORDER — POTASSIUM CHLORIDE IN NACL 20-0.9 MEQ/L-% IV SOLN
Freq: Once | INTRAVENOUS | Status: AC
  Filled 2022-09-29: qty 1000

## 2022-09-29 MED ORDER — INSULIN GLARGINE-YFGN 100 UNIT/ML ~~LOC~~ SOLN
15.0000 [IU] | Freq: Every day | SUBCUTANEOUS | Status: DC
  Administered 2022-09-29 – 2022-09-30 (×2): 15 [IU] via SUBCUTANEOUS
  Filled 2022-09-29 (×2): qty 0.15

## 2022-09-29 MED ORDER — INSULIN ASPART 100 UNIT/ML IJ SOLN: 0.0000 [IU] | INTRAMUSCULAR | Status: DC

## 2022-09-29 MED ORDER — METHYLPREDNISOLONE SODIUM SUCC 125 MG IJ SOLR
125.0000 mg | Freq: Once | INTRAMUSCULAR | Status: AC
  Administered 2022-09-29: 125 mg via INTRAVENOUS
  Filled 2022-09-29: qty 2

## 2022-09-29 MED ORDER — ENOXAPARIN SODIUM 40 MG/0.4ML IJ SOSY
40.0000 mg | PREFILLED_SYRINGE | INTRAMUSCULAR | Status: DC
  Administered 2022-09-30 – 2022-10-03 (×4): 40 mg via SUBCUTANEOUS
  Filled 2022-09-29 (×4): qty 0.4

## 2022-09-29 MED ORDER — BUDESONIDE 0.5 MG/2ML IN SUSP
0.5000 mg | Freq: Once | RESPIRATORY_TRACT | Status: AC
  Administered 2022-09-29: 0.5 mg via RESPIRATORY_TRACT
  Filled 2022-09-29: qty 2

## 2022-09-29 MED ORDER — ATOGEPANT 60 MG PO TABS: 60.0000 mg | ORAL_TABLET | Freq: Every day | ORAL | Status: DC | PRN

## 2022-09-29 MED ORDER — IPRATROPIUM-ALBUTEROL 0.5-2.5 (3) MG/3ML IN SOLN
3.0000 mL | Freq: Once | RESPIRATORY_TRACT | Status: AC
  Administered 2022-09-29: 3 mL via RESPIRATORY_TRACT
  Filled 2022-09-29: qty 3

## 2022-09-29 MED ORDER — SODIUM CHLORIDE 0.9% FLUSH
3.0000 mL | Freq: Two times a day (BID) | INTRAVENOUS | Status: DC
  Administered 2022-09-29 – 2022-10-01 (×5): 3 mL via INTRAVENOUS

## 2022-09-29 MED ORDER — ENOXAPARIN SODIUM 40 MG/0.4ML IJ SOSY
40.0000 mg | PREFILLED_SYRINGE | INTRAMUSCULAR | Status: DC
  Administered 2022-09-29: 40 mg via SUBCUTANEOUS
  Filled 2022-09-29: qty 0.4

## 2022-09-29 MED ORDER — LINAGLIPTIN 5 MG PO TABS: 5.0000 mg | ORAL_TABLET | Freq: Every day | ORAL | Status: DC

## 2022-09-29 MED ORDER — CHLORHEXIDINE GLUCONATE CLOTH 2 % EX PADS
6.0000 | MEDICATED_PAD | Freq: Every day | CUTANEOUS | Status: DC
  Administered 2022-09-29 – 2022-10-03 (×3): 6 via TOPICAL

## 2022-09-29 MED ORDER — METHYLPREDNISOLONE SODIUM SUCC 40 MG IJ SOLR
40.0000 mg | Freq: Every day | INTRAMUSCULAR | Status: DC
  Administered 2022-09-29 – 2022-10-01 (×3): 40 mg via INTRAVENOUS
  Filled 2022-09-29 (×3): qty 1

## 2022-09-29 MED ORDER — OXYCODONE HCL 5 MG PO TABS
5.0000 mg | ORAL_TABLET | ORAL | Status: DC | PRN
  Administered 2022-09-29 – 2022-10-01 (×2): 5 mg via ORAL
  Filled 2022-09-29 (×2): qty 1

## 2022-09-29 MED ORDER — ZOLPIDEM TARTRATE 5 MG PO TABS
5.0000 mg | ORAL_TABLET | Freq: Every evening | ORAL | Status: DC | PRN
  Administered 2022-09-29 – 2022-10-02 (×4): 5 mg via ORAL
  Filled 2022-09-29 (×4): qty 1

## 2022-09-29 MED ORDER — INSULIN ASPART 100 UNIT/ML IJ SOLN
0.0000 [IU] | INTRAMUSCULAR | Status: DC
  Filled 2022-09-29: qty 0.15

## 2022-09-29 MED ORDER — VITAMIN D (ERGOCALCIFEROL) 1.25 MG (50000 UNIT) PO CAPS
50000.0000 [IU] | ORAL_CAPSULE | ORAL | Status: DC
  Administered 2022-09-30: 50000 [IU] via ORAL
  Filled 2022-09-29: qty 1

## 2022-09-29 MED ORDER — INSULIN ASPART 100 UNIT/ML IJ SOLN
0.0000 [IU] | Freq: Three times a day (TID) | INTRAMUSCULAR | Status: DC
  Administered 2022-09-29 (×3): 15 [IU] via SUBCUTANEOUS
  Filled 2022-09-29: qty 0.15

## 2022-09-29 MED ORDER — ACETAMINOPHEN 650 MG RE SUPP: 650.0000 mg | Freq: Four times a day (QID) | RECTAL | Status: DC | PRN

## 2022-09-29 MED ORDER — ORAL CARE MOUTH RINSE: 15.0000 mL | OROMUCOSAL | Status: DC | PRN

## 2022-09-29 MED ORDER — LORAZEPAM 2 MG/ML IJ SOLN
1.0000 mg | Freq: Once | INTRAMUSCULAR | Status: AC
  Administered 2022-09-29: 1 mg via INTRAVENOUS
  Filled 2022-09-29: qty 1

## 2022-09-29 MED ORDER — POTASSIUM CHLORIDE CRYS ER 20 MEQ PO TBCR
40.0000 meq | EXTENDED_RELEASE_TABLET | Freq: Every day | ORAL | Status: DC
  Administered 2022-09-29 – 2022-09-30 (×2): 40 meq via ORAL
  Filled 2022-09-29 (×2): qty 2

## 2022-09-29 MED ORDER — IPRATROPIUM-ALBUTEROL 0.5-2.5 (3) MG/3ML IN SOLN
3.0000 mL | Freq: Three times a day (TID) | RESPIRATORY_TRACT | Status: DC
  Administered 2022-09-30 – 2022-10-03 (×11): 3 mL via RESPIRATORY_TRACT
  Filled 2022-09-29 (×11): qty 3

## 2022-09-29 MED ORDER — HYDRALAZINE HCL 20 MG/ML IJ SOLN: 10.0000 mg | Freq: Four times a day (QID) | INTRAMUSCULAR | Status: DC | PRN

## 2022-09-29 MED ORDER — FLUOXETINE HCL 10 MG PO CAPS: 10.0000 mg | ORAL_CAPSULE | Freq: Every day | ORAL | Status: DC

## 2022-09-29 MED ORDER — ACETAMINOPHEN 325 MG PO TABS
650.0000 mg | ORAL_TABLET | Freq: Four times a day (QID) | ORAL | Status: DC | PRN
  Administered 2022-09-29 – 2022-10-02 (×5): 650 mg via ORAL
  Filled 2022-09-29 (×5): qty 2

## 2022-09-29 MED ORDER — IPRATROPIUM-ALBUTEROL 0.5-2.5 (3) MG/3ML IN SOLN
3.0000 mL | RESPIRATORY_TRACT | Status: DC | PRN
  Administered 2022-09-29 – 2022-10-02 (×2): 3 mL via RESPIRATORY_TRACT
  Filled 2022-09-29 (×2): qty 3

## 2022-09-29 NOTE — Progress Notes (Signed)
Primary nurse reached out to provider Zebedee Iba, NP, patient's last sugar was 384 and she has no coverage at this time. Will continue to monitor.

## 2022-09-29 NOTE — ED Provider Notes (Incomplete)
[facility] Emergency Department Provider Note MRN:  [id number]  Arrival date & time: 09/29/22     Chief Complaint   Shortness of Breath and Cough   History of Present Illness   Katherine Douglas is a 42 y.o. year-old female with a history of diabetes, hypertension, COPD presenting to the ED with chief complaint of shortness of breath.  Shortness of breath worsening over the course of a few days.  Also having some right-sided pain under the breast and in the right thoracic back that is worse with deep breaths.  Trouble taking a deep breath due to the pain.  Review of Systems  A thorough review of systems was obtained and all systems are negative except as noted in the HPI and PMH.   Patient's Health History    Past Medical History:  Diagnosis Date  . Asthma   . Depression   . Diabetes mellitus without complication ([place])    pt states was gestational with previous pregnancy  . Headache   . Hypertension   . Panic attacks   . Pneumonia   . Stress     Past Surgical History:  Procedure Laterality Date  . INDUCED ABORTION      Family History  Problem Relation Age of Onset  . Hypertension Mother   . Heart failure Mother   . Diabetes Father   . CAD Other     Social History   Socioeconomic History  . Marital status: Legally Separated    Spouse name: Not on file  . Number of children: Not on file  . Years of education: Not on file  . Highest education level: Not on file  Occupational History  . Not on file  Tobacco Use  . Smoking status: Former    Packs/day: 0.25    Years: 18.00    Total pack years: 4.50    Types: Cigarettes    Quit date: 09/15/2014    Years since quitting: 8.0  . Smokeless tobacco: Never  Vaping Use  . Vaping Use: Never used  Substance and Sexual Activity  . Alcohol use: Not Currently  . Drug use: Yes    Frequency: 4.0 times per week    Types: Marijuana  . Sexual activity: Not Currently    Birth  control/protection: Implant  Other Topics Concern  . Not on file  Social History Narrative  . Not on file   Social Determinants of Health   Financial Resource Strain: Not on file  Food Insecurity: Food Insecurity Present (09/07/2022)   Hunger Vital Sign   . Worried About Charity fundraiser in the Last Year: Often true   . Ran Out of Food in the Last Year: Often true  Transportation Needs: Unmet Transportation Needs (09/07/2022)   PRAPARE - Transportation   . Lack of Transportation (Medical): Yes   . Lack of Transportation (Non-Medical): Yes  Physical Activity: Not on file  Stress: Not on file  Social Connections: Not on file  Intimate Partner Violence: Not At Risk (09/07/2022)   Humiliation, Afraid, Rape, and Kick questionnaire   . Fear of Current or Ex-Partner: No   . Emotionally Abused: No   . Physically Abused: No   . Sexually Abused: No     Physical Exam   Vitals:   09/28/22 2330  BP: (!) 155/108  Pulse: (!) 140  Resp: (!) 27  SpO2: 93%    CONSTITUTIONAL: Chronically ill-appearing, moderate respiratory distress NEURO/PSYCH:  Alert and oriented x 3, no  focal deficits EYES:  eyes equal and reactive ENT/NECK:  no LAD, no JVD CARDIO: Tachycardic rate, well-perfused, normal S1 and S2 PULM: Diffuse wheezing, tachypneic, pursed lip breathing GI/GU:  non-distended, non-tender MSK/SPINE:  No gross deformities, no edema SKIN:  no rash, atraumatic   *Additional and/or pertinent findings included in MDM below  Diagnostic and Interventional Summary    EKG Interpretation  Date/Time:    Ventricular Rate:    PR Interval:    QRS Duration:   QT Interval:    QTC Calculation:   R Axis:     Text Interpretation:        *** Labs Reviewed  RESP PANEL BY RT-PCR (RSV, FLU A&B, COVID)  [id number]  BASIC METABOLIC PANEL  CBC  BRAIN NATRIURETIC PEPTIDE  TROPONIN I (HIGH SENSITIVITY)    DG Chest Port 1 View  Final Result    CT Angio Chest Pulmonary Embolism (PE) W or WO  Contrast    (Results Pending)    Medications  methylPREDNISolone sodium succinate (SOLU-MEDROL) 125 mg/2 mL injection 125 mg (has no administration in time range)  ipratropium-albuterol (DUONEB) 0.5-2.5 (3) MG/3ML nebulizer solution 3 mL (has no administration in time range)     Procedures  /  Critical Care Procedures  ED Course and Medical Decision Making  Initial Impression and Ddx ***  Past medical/surgical history that increases complexity of ED encounter:  ***  Interpretation of Diagnostics I personally reviewed the {BEROINTERP:26835} and my interpretation is as follows:  ***  ***  Patient Reassessment and Ultimate Disposition/Management     ***  Patient management required discussion with the following services or consulting groups:  {BEROCONSULT:26841}  Complexity of Problems Addressed {BEROCOPA:26833}  Additional Data Reviewed and Analyzed Further history obtained from: {BERODATA:26834}  Additional Factors Impacting ED Encounter Risk {EYCXKGYJ:85631}  Barth Kirks. Sedonia Small, [place] [email or web address]  Final Clinical Impressions(s) / ED Diagnoses     ICD-10-CM   1. SOB (shortness of breath)  R06.02       ED Discharge Orders     None        Discharge Instructions Discussed with and Provided to Patient:   Discharge Instructions   None

## 2022-09-29 NOTE — Progress Notes (Signed)
Nurse reached out to the provider Zebedee Iba, NP due to pt blood sugar of 357. Pt just received 15 units of novolog at 2152 for blood sugar of 384. No new orders at this time. Will continue to monitor.

## 2022-09-29 NOTE — H&P (Signed)
History and Physical    Patient: Katherine Douglas:[id number] DOB: Aug 01, 1981 DOA: 09/28/2022 DOS: the patient was seen and examined on 09/29/2022 PCP: [facility] [place]  Patient coming from: Home  Chief Complaint:  Chief Complaint  Patient presents with   Shortness of Breath   Cough   HPI: Katherine Douglas is a 42 y.o. female with medical history significant of morbid obesity, tobacco abuse with recent cessation attempt after hospitalization in December, diabetes mellitus not on insulin prior to admission, hypertension, panic attacks and depression.  Patient was recently hospitalized and discharged on 12/16 due to a COPD exacerbation.  She reports over the past 4 days prior to presentation she developed progressive shortness of breath and dyspnea on exertion with pleuritic chest pain.  In the ER she was found to be tachypneic and tachycardic with increased work of breathing.  She was placed on BiPAP but subsequently asked to have the BiPAP removed due to claustrophobia symptoms.  She was found to be positive for influenza A.  In addition her CBGs have been running over 200s and after receiving some IV Solu-Medrol her CBG went up to 551.  Have been asked to evaluate this patient for admission.  Patient of the patient she was doing relatively well on 5 L of oxygen at rest although she was having some increased work of breathing but she was able to talk easily and reported that she was overall comfortable.  Once again she reported that she was unable to tolerate the BiPAP due to claustrophobia.  She states she was supposed to go have a sleep study but had not have this done.  We discussed trial of using a nasal CPAP mask to see if she could tolerate that better.  It is noted that with BiPAP you cannot use a nasal mask.  She agreed to this plan.  She states she had stopped smoking after her last admission and had done well until her sister came over and even though she was asked not to  assist her smoked a cigarette in the house and this increased the patient's urge for cigarette so she smoked.  Plan is to admit the patient to the stepdown unit given her increased work of breathing on 5 L of oxygen and need for intermittent CPAP for increased work of breathing.  Review of Systems: As mentioned in the history of present illness. All other systems reviewed and are negative. Past Medical History:  Diagnosis Date   Asthma    Depression    Diabetes mellitus without complication ([place])    pt states was gestational with previous pregnancy   Headache    Hypertension    Panic attacks    Pneumonia    Stress    Past Surgical History:  Procedure Laterality Date   INDUCED ABORTION     Social History:  reports that she quit smoking about 8 years ago. Her smoking use included cigarettes. She has a 4.50 pack-year smoking history. She has never used smokeless tobacco. She reports that she does not currently use alcohol. She reports current drug use. Frequency: 4.00 times per week. Drug: Marijuana.  Allergies  Allergen Reactions   Other Anaphylaxis    Crawfish    Amoxicillin Hives    Has patient had a PCN reaction causing immediate rash, facial/tongue/throat swelling, SOB or lightheadedness with hypotension:unsure Has patient had a PCN reaction causing severe rash involving mucus membranes or skin necrosis:No Has patient had a PCN reaction that required hospitalization:No Has  patient had a PCN reaction occurring within the last 10 years: No If all of the above answers are "NO", then may proceed with Cephalosporin use.    Peanut-Containing Drug Products Itching   Penicillins Itching    Has patient had a PCN reaction causing immediate rash, facial/tongue/throat swelling, SOB or lightheadedness with hypotension:unsure Has patient had a PCN reaction causing severe rash involving mucus membranes or skin necrosis:No Has patient had a PCN reaction that required hospitalization:No Has  patient had a PCN reaction occurring within the last 10 years: No If all of the above answers are "NO", then may proceed with Cephalosporin use.     Strawberry Extract Hives, Itching and Rash    Family History  Problem Relation Age of Onset   Hypertension Mother    Heart failure Mother    Diabetes Father    CAD Other     Prior to Admission medications   Medication Sig Start Date End Date Taking? Authorizing Provider  atenolol-chlorthalidone (TENORETIC) 50-25 MG tablet Take 1 tablet by mouth daily. 02/26/20   [provider]  etonogestrel (NEXPLANON) 68 MG IMPL implant 1 each by Subdermal route once.    [provider]  FLUoxetine (PROZAC) 10 MG capsule Take 1 capsule (10 mg total) by mouth daily. 01/15/20   Regalado, Belkys A, MD  glipiZIDE (GLUCOTROL) 5 MG tablet Take 1 tablet (5 mg total) by mouth daily before breakfast. 09/12/22 12/11/22  Dahal, Marlowe Aschoff, MD  levalbuterol (XOPENEX HFA) 45 MCG/ACT inhaler Inhale 1 puff into the lungs every 4 (four) hours as needed for wheezing. 09/12/22 09/12/23  Terrilee Croak, MD  lisinopril (ZESTRIL) 20 MG tablet Take 1 tablet (20 mg total) by mouth daily. 01/16/20   Regalado, Belkys A, MD  mometasone-formoterol (DULERA) 200-5 MCG/ACT AERO Inhale 1 puff into the lungs 2 (two) times daily. 01/15/20   Regalado, Belkys A, MD  naloxone (NARCAN) nasal spray 4 mg/0.1 mL Place 1 spray into the nose as needed (accidental overdose). 08/02/22   [provider]  oxyCODONE (OXY IR/ROXICODONE) 5 MG immediate release tablet Take 5 mg by mouth every 4 (four) hours as needed for moderate pain or severe pain. 08/30/22   [provider]  polyethylene glycol powder (GLYCOLAX/MIRALAX) 17 GM/SCOOP powder Take 17 g by mouth daily. 05/30/20   Zigmund Gottron, NP  predniSONE (DELTASONE) 10 MG tablet Take 4 tablets daily X 2 days, then, Take 3 tablets daily X 2 days, then, Take 2 tablets daily X 2 days, then, Take 1 tablets daily X 1 day. 09/12/22    Dahal, Marlowe Aschoff, MD  sitaGLIPtin (JANUVIA) 25 MG tablet Take 1 tablet (25 mg total) by mouth daily. 09/12/22 12/11/22  Terrilee Croak, MD  tiZANidine (ZANAFLEX) 4 MG tablet Take 4 mg by mouth 3 (three) times daily as needed for muscle spasms. 08/02/22   [provider]  Vitamin D, Ergocalciferol, (DRISDOL) 1.25 MG (50000 UNIT) CAPS capsule Take 50,000 Units by mouth once a week. 09/06/22   [provider]    Physical Exam: Vitals:   09/29/22 0515 09/29/22 0530 09/29/22 0600 09/29/22 0630  BP:  113/77 122/85 117/88  Pulse: (!) 103 (!) 107 (!) 104 (!) 111  Resp: 17 19 (!) 21 (!) 22  Temp:      TempSrc:      SpO2: 91% 90% 90% 92%  Weight:      Height:       Constitutional: NAD, calm, comfortable Eyes: PERRL, lids and conjunctivae normal ENMT: Mucous membranes  are moist. Posterior pharynx clear of any exudate or lesions.Normal dentition.  Neck: normal, supple, no masses, no thyromegaly Respiratory: End expiratory wheezing on exam although wheezes are fine as are the crackles.  Subtle tachypnea as well as increased work of breathing on 5 L nasal cannula oxygen.  Able to speak without losing her breath. Cardiovascular: Regular rate and rhythm, no murmurs / rubs / gallops. No extremity edema. 2+ pedal pulses.  Abdomen: no tenderness, no masses palpated. Bowel sounds positive.  Tolerating oral diet without difficulty Musculoskeletal: no clubbing / cyanosis. No joint deformity upper and lower extremities. Good ROM, no contractures. Normal muscle tone.  Skin: no rashes, lesions, ulcers. No induration Neurologic: CN 2-12 grossly intact. Sensation intact, Strength 5/5 x all 4 extremities.  Psychiatric: Normal judgment and insight. Alert and oriented x 3. Normal mood.   Data Reviewed:  Sodium 133, potassium 3.7, chloride 96, glucose 283, calcium 8.7, BNP 7.9, troponin 3 x 2 collections, white count 8900, platelets 410,000, hemoglobin 12.3; influenza A PCR positive.  CT angio of the  chest with and without contrast revealed no evidence of PE and no other acute intrathoracic process.  Portable chest x-ray revealed no active disease.  Assessment and Plan: Acute hypoxemic respiratory failure secondary to influenza A New diagnosis of COPD during previous admission Combination of patient's COPD diagnosis/former tobacco abuse/obesity and now acute influenza contributing to patient's current respiratory status Continue supportive care with Tamiflu Low-dose steroids 40 mg daily given underlying diabetes Utilize budesonide nebulizer as well; as needed DuoNebs Tolerant to BiPAP full facemask due to severe claustrophobia (has history of panic attacks)-instead we will trial CPAP as needed with use of a nasal mask For now continue 5 L O2-her claustrophobia unsure how she would tolerate of any mask  Diabetes mellitus not on chronic insulin with hyperglycemia CBG as high as 551 during this admission and likely secondary to steroids Not on insulin at home but given acute infection and need for steroids will administer Semglee 15 units daily along with carbohydrate modified diet Continue home Glucotrol Follow CBGs and provide SSI Hemoglobin A1c was 8.8  Suspected underlying sleep apnea Patient has yet to follow-up and have a sleep study , She is unable to tolerate BiPAP due to claustrophobia we will attempt to utilize CPAP as needed with a nasal mask to treat her current respiratory symptoms  Obesity Contributing factor to patient's current respiratory status Lee's note that weight in chart is incorrect states patient weighs 135.7 pounds    Advance Care Planning:   Code Status: Full Code   DVT prophylaxis: Lovenox  Consults: None  Family Communication: Patient only  Severity of Illness: The appropriate patient status for this patient is INPATIENT. Inpatient status is judged to be reasonable and necessary in order to provide the required intensity of service to ensure the  patient's safety. The patient's presenting symptoms, physical exam findings, and initial radiographic and laboratory data in the context of their chronic comorbidities is felt to place them at high risk for further clinical deterioration. Furthermore, it is not anticipated that the patient will be medically stable for discharge from the hospital within 2 midnights of admission.   * I certify that at the point of admission it is my clinical judgment that the patient will require inpatient hospital care spanning beyond 2 midnights from the point of admission due to high intensity of service, high risk for further deterioration and high frequency of surveillance required.*  Author: Erin Hearing, NP 09/29/2022 7:19  AM  For on call review [email or web address].

## 2022-09-29 NOTE — ED Provider Notes (Signed)
[facility] Emergency Department Provider Note MRN:  [id number]  Arrival date & time: 09/29/22     Chief Complaint   Shortness of Breath and Cough   History of Present Illness   Katherine Douglas is a 42 y.o. year-old female with a history of diabetes, hypertension, COPD presenting to the ED with chief complaint of shortness of breath.  Shortness of breath worsening over the course of a few days.  Also having some right-sided pain under the breast and in the right thoracic back that is worse with deep breaths.  Trouble taking a deep breath due to the pain.  Review of Systems  A thorough review of systems was obtained and all systems are negative except as noted in the HPI and PMH.   Patient's Health History    Past Medical History:  Diagnosis Date   Asthma    Depression    Diabetes mellitus without complication ([place])    pt states was gestational with previous pregnancy   Headache    Hypertension    Panic attacks    Pneumonia    Stress     Past Surgical History:  Procedure Laterality Date   INDUCED ABORTION      Family History  Problem Relation Age of Onset   Hypertension Mother    Heart failure Mother    Diabetes Father    CAD Other     Social History   Socioeconomic History   Marital status: Legally Separated    Spouse name: Not on file   Number of children: Not on file   Years of education: Not on file   Highest education level: Not on file  Occupational History   Not on file  Tobacco Use   Smoking status: Former    Packs/day: 0.25    Years: 18.00    Total pack years: 4.50    Types: Cigarettes    Quit date: 09/15/2014    Years since quitting: 8.0   Smokeless tobacco: Never  Vaping Use   Vaping Use: Never used  Substance and Sexual Activity   Alcohol use: Not Currently   Drug use: Yes    Frequency: 4.0 times per week    Types: Marijuana   Sexual activity: Not Currently    Birth control/protection: Implant  Other Topics  Concern   Not on file  Social History Narrative   Not on file   Social Determinants of Health   Financial Resource Strain: Not on file  Food Insecurity: Food Insecurity Present (09/07/2022)   Hunger Vital Sign    Worried About Running Out of Food in the Last Year: Often true    Ran Out of Food in the Last Year: Often true  Transportation Needs: Unmet Transportation Needs (09/07/2022)   PRAPARE - Hydrologist (Medical): Yes    Lack of Transportation (Non-Medical): Yes  Physical Activity: Not on file  Stress: Not on file  Social Connections: Not on file  Intimate Partner Violence: Not At Risk (09/07/2022)   Humiliation, Afraid, Rape, and Kick questionnaire    Fear of Current or Ex-Partner: No    Emotionally Abused: No    Physically Abused: No    Sexually Abused: No     Physical Exam   Vitals:   09/29/22 0146 09/29/22 0230  BP: (!) 149/97 (!) 127/90  Pulse: (!) 125 (!) 116  Resp: (!) 22 19  SpO2: 99% 95%    CONSTITUTIONAL: Chronically ill-appearing, moderate respiratory distress  NEURO/PSYCH:  Alert and oriented x 3, no focal deficits EYES:  eyes equal and reactive ENT/NECK:  no LAD, no JVD CARDIO: Tachycardic rate, well-perfused, normal S1 and S2 PULM: Diffuse wheezing, tachypneic, pursed lip breathing GI/GU:  non-distended, non-tender MSK/SPINE:  No gross deformities, no edema SKIN:  no rash, atraumatic   *Additional and/or pertinent findings included in MDM below  Diagnostic and Interventional Summary    EKG Interpretation  Date/Time:  Wednesday September 28 2022 23:55:25 EST Ventricular Rate:  128 PR Interval:  143 QRS Duration: 73 QT Interval:  312 QTC Calculation: 456 R Axis:   94 Text Interpretation: Sinus tachycardia Borderline right axis deviation Confirmed by Gerlene Fee [id number]) on 09/29/2022 12:04:49 AM       Labs Reviewed  RESP PANEL BY RT-PCR (RSV, FLU A&B, COVID)  RVPGX2 - Abnormal; Notable for the following  components:      Result Value   Influenza A by PCR POSITIVE (*)    All other components within normal limits  BASIC METABOLIC PANEL - Abnormal; Notable for the following components:   Sodium 133 (*)    Chloride 96 (*)    Glucose, Bld 283 (*)    Calcium 8.7 (*)    All other components within normal limits  CBC - Abnormal; Notable for the following components:   RDW 15.6 (*)    Platelets 410 (*)    All other components within normal limits  BRAIN NATRIURETIC PEPTIDE  TROPONIN I (HIGH SENSITIVITY)  TROPONIN I (HIGH SENSITIVITY)    CT Angio Chest Pulmonary Embolism (PE) W or WO Contrast  Final Result    DG Chest Port 1 View  Final Result      Medications  methylPREDNISolone sodium succinate (SOLU-MEDROL) 125 mg/2 mL injection 125 mg (125 mg Intravenous Given 09/29/22 0030)  ipratropium-albuterol (DUONEB) 0.5-2.5 (3) MG/3ML nebulizer solution 3 mL (3 mLs Nebulization Given 09/29/22 0026)  iohexol (OMNIPAQUE) 350 MG/ML injection 100 mL (100 mLs Intravenous Contrast Given 09/29/22 0208)  LORazepam (ATIVAN) injection 1 mg (1 mg Intravenous Given 09/29/22 0202)     Procedures  /  Critical Care .Critical Care  Performed by: Maudie Flakes, MD Authorized by: Maudie Flakes, MD   Critical care provider statement:    Critical care time (minutes):  35   Critical care was necessary to treat or prevent imminent or life-threatening deterioration of the following conditions:  Respiratory failure   Critical care was time spent personally by me on the following activities:  Development of treatment plan with patient or surrogate, discussions with consultants, evaluation of patient's response to treatment, examination of patient, ordering and review of laboratory studies, ordering and review of radiographic studies, ordering and performing treatments and interventions, pulse oximetry, re-evaluation of patient's condition and review of old charts   ED Course and Medical Decision Making  Initial  Impression and Ddx COPD exacerbation is the most likely cause of patient's symptoms however with the pleuritic right-sided chest/thoracic back pain PE is also considered.  She is tachycardic in the 140s and mild to moderate respiratory distress.  She is being placed on BiPAP for support, will need CTPA.  Past medical/surgical history that increases complexity of ED encounter: COPD  Interpretation of Diagnostics I personally reviewed the EKG and my interpretation is as follows: Sinus tachycardia  Labs reveal no significant blood count or electrolyte disturbance, minimal hyponatremia, troponin negative x 2.  CT PE study is negative.  Patient Reassessment and Ultimate Disposition/Management Clinical Course as of 09/29/22  0252  Thu Sep 29, 2022  0102 Influenza A By PCR(!): POSITIVE [MB]    Clinical Course User Index [MB] Maudie Flakes, MD     Patient required BiPAP, improved but still needs admission for further care.  Patient management required discussion with the following services or consulting groups:  Hospitalist Service  Complexity of Problems Addressed Acute illness or injury that poses threat of life of bodily function  Additional Data Reviewed and Analyzed Further history obtained from: Prior labs/imaging results  Additional Factors Impacting ED Encounter Risk Consideration of hospitalization  Barth Kirks. [place] Small, [place] [email or web address]  Final Clinical Impressions(s) / ED Diagnoses     ICD-10-CM   1. SOB (shortness of breath)  R06.02     2. COPD exacerbation ([place])  J44.1     3. Influenza A  J10.1       ED Discharge Orders     None        Discharge Instructions Discussed with and Provided to Patient:   Discharge Instructions   None      Maudie Flakes, MD 09/29/22 [phone]

## 2022-09-29 NOTE — ED Notes (Signed)
Pt transport to CT  

## 2022-09-29 NOTE — ED Notes (Signed)
Pt removed bipap mask, pt tolerating 3 l/m nasal cannula. Pt's spo2 96%

## 2022-09-30 DIAGNOSIS — J09X1 Influenza due to identified novel influenza A virus with pneumonia: Secondary | ICD-10-CM | POA: Diagnosis not present

## 2022-09-30 LAB — COMPREHENSIVE METABOLIC PANEL
ALT: 52 U/L — ABNORMAL HIGH (ref 0–44)
AST: 50 U/L — ABNORMAL HIGH (ref 15–41)
Albumin: 3.5 g/dL (ref 3.5–5.0)
Alkaline Phosphatase: 89 U/L (ref 38–126)
Anion gap: 10 (ref 5–15)
BUN: 18 mg/dL (ref 6–20)
CO2: 26 mmol/L (ref 22–32)
Calcium: 8.8 mg/dL — ABNORMAL LOW (ref 8.9–10.3)
Chloride: 101 mmol/L (ref 98–111)
Creatinine, Ser: 0.81 mg/dL (ref 0.44–1.00)
GFR, Estimated: >60 mL/min (ref >60)
Glucose, Bld: 178 mg/dL — ABNORMAL HIGH (ref 70–99)
Potassium: 4 mmol/L (ref 3.5–5.1)
Sodium: 137 mmol/L (ref 135–145)
Total Bilirubin: 0.3 mg/dL (ref 0.3–1.2)
Total Protein: 7.4 g/dL (ref 6.5–8.1)

## 2022-09-30 LAB — GLUCOSE, CAPILLARY
Glucose-Capillary: 341 mg/dL — ABNORMAL HIGH (ref 70–99)
Glucose-Capillary: 158 mg/dL — ABNORMAL HIGH (ref 70–99)
Glucose-Capillary: 204 mg/dL — ABNORMAL HIGH (ref 70–99)
Glucose-Capillary: 341 mg/dL — ABNORMAL HIGH (ref 70–99)
Glucose-Capillary: 329 mg/dL — ABNORMAL HIGH (ref 70–99)
Glucose-Capillary: 390 mg/dL — ABNORMAL HIGH (ref 70–99)
Glucose-Capillary: 341 mg/dL — ABNORMAL HIGH (ref 70–99)

## 2022-09-30 LAB — CBC
HCT: 39.1 % (ref 36.0–46.0)
Hemoglobin: 12 g/dL (ref 12.0–15.0)
MCH: 26.4 pg (ref 26.0–34.0)
MCHC: 30.7 g/dL (ref 30.0–36.0)
MCV: 86.1 fL (ref 80.0–100.0)
Platelets: 432 10*3/uL — ABNORMAL HIGH (ref 150–400)
RBC: 4.54 MIL/uL (ref 3.87–5.11)
RDW: 15.8 % — ABNORMAL HIGH (ref 11.5–15.5)
WBC: 12.7 10*3/uL — ABNORMAL HIGH (ref 4.0–10.5)
nRBC: 0 % (ref 0.0–0.2)

## 2022-09-30 MED ORDER — OSELTAMIVIR PHOSPHATE 75 MG PO CAPS
75.0000 mg | ORAL_CAPSULE | Freq: Two times a day (BID) | ORAL | Status: DC
  Administered 2022-09-30 – 2022-10-03 (×7): 75 mg via ORAL
  Filled 2022-09-30 (×7): qty 1

## 2022-09-30 MED ORDER — ALPRAZOLAM 0.25 MG PO TABS
0.2500 mg | ORAL_TABLET | Freq: Once | ORAL | Status: AC
  Administered 2022-09-30: 0.25 mg via ORAL
  Filled 2022-09-30: qty 1

## 2022-09-30 MED ORDER — INSULIN GLARGINE-YFGN 100 UNIT/ML ~~LOC~~ SOLN
12.0000 [IU] | Freq: Two times a day (BID) | SUBCUTANEOUS | Status: DC
  Administered 2022-09-30 – 2022-10-03 (×6): 12 [IU] via SUBCUTANEOUS
  Filled 2022-09-30 (×8): qty 0.12

## 2022-09-30 NOTE — TOC Initial Note (Signed)
Transition of Care [facility]) - Initial/Assessment Note    Patient Details  Name: Katherine Douglas MRN: [id number] Date of Birth: [birth year]  [facility] CM/SW Contact:    Roseanne Kaufman, RN Phone Number: 09/30/2022, 4:02 PM  Clinical Narrative:   This RNCM spoke with patient who reports prior to admission, no HH services, no DME services in place.  Transportation at discharge: family         No current TOC needs at this time. TOC will continue to follow for discharge needs.          Expected Discharge Plan: Home/Self Care Barriers to Discharge: Continued Medical Work up   Patient Goals and CMS Choice Patient states their goals for this hospitalization and ongoing recovery are:: return home   Choice offered to / list presented to : NA      Expected Discharge Plan and Services In-house Referral: NA Discharge Planning Services: CM Consult Post Acute Care Choice: NA Living arrangements for the past 2 months: Single Family Home                                      Prior Living Arrangements/Services Living arrangements for the past 2 months: Single Family Home Lives with:: Relatives Patient language and need for interpreter reviewed:: Yes Do you feel safe going back to the place where you live?: Yes      Need for Family Participation in Patient Care: Yes (Comment) Care giver support system in place?: Yes (comment) Current home services: Other (comment) (none) Criminal Activity/Legal Involvement Pertinent to Current Situation/Hospitalization: No - Comment as needed  Activities of Daily Living      Permission Sought/Granted Permission sought to share information with : Case Manager Permission granted to share information with : Yes, Verbal Permission Granted  Share Information with NAME: Case Manager           Emotional Assessment Appearance:: Appears stated age Attitude/Demeanor/Rapport: Gracious Affect (typically observed):  Accepting Orientation: : Oriented to Self, Oriented to Place, Oriented to  Time, Oriented to Situation Alcohol / Substance Use: Tobacco Use, Illicit Drugs Psych Involvement: No (comment)  Admission diagnosis:  SOB (shortness of breath) [R06.02] Influenza A [J10.1] COPD exacerbation (HCC) [J44.1] Influenza A with pneumonia [J09.X1] Patient Active Problem List   Diagnosis Date Noted   Influenza A with pneumonia 09/29/2022   COPD with acute exacerbation ([place]) 09/07/2022   Acute hypoxemic respiratory failure ([place]) 09/07/2022   Type 2 diabetes mellitus ([place]) 09/07/2022   Essential hypertension 01/13/2020   Leukocytosis 01/12/2020   Hyperglycemia 01/12/2020   Anxiety 01/12/2020   Asthma exacerbation 01/11/2020   Bronchitis due to tobacco use 01/11/2020   Tobacco abuse 01/11/2020   Hypertensive urgency 01/11/2020   Sinus tachycardia 01/11/2020   Morbid obesity ([place]) 07/05/2017   Multiparity 04/14/2015   PCP:  [facility] [place]:   [place], [place] AT [facility] [address] [place] [address] Phone: [phone] Fax: [place], [place] AT [place] [address] Phone: [phone] Fax: [phone]     Social Determinants of Health (SDOH) Social History: [place]: Food Insecurity Present (09/07/2022)  Housing: High Risk (09/09/2022)  Transportation Needs: Unmet Transportation Needs (09/07/2022)  Utilities: At  Risk (09/07/2022)  Tobacco Use: Medium Risk (09/29/2022)   SDOH Interventions:     Readmission Risk Interventions     No data to display

## 2022-09-30 NOTE — Progress Notes (Signed)
No need of cpap pt said she cannot tolerate it and doesn't wear any at home. Resting well with saturation of 100%.

## 2022-09-30 NOTE — Progress Notes (Signed)
PROGRESS NOTE   Katherine Douglas  SWN:[id number] DOB: Oct 01, 1980 DOA: 09/28/2022 PCP: [facility]  Brief Narrative:  42 year old black female smoker till recently Super morbid obesity BMI 53 Recent diagnosis DM TY 2 HTN Anxiety/panic attacks/depression Recent hospitalization discharge 12/16 COPD Readmitted with worsening SOB right-sided chest pain and difficulty taking deep breaths Workup had CT chest which was negative but found to be flu positive initially placed on BiPAP but became claustrophobic and was de-escalated to high flow oxygen  Hospital-Problem based course  Acute hypoxic respiratory failure secondary to H influenza A superimposed on COPD -Decompensates when taking off oxygen and work of breathing increases - We will need to monitor her closely but she may be able to transfer to progressive later today if she looks more stable -Keep on precautions continue Tamiflu 75 twice daily  BMI 53 superobesity - Restrictive component noted in addition and will need to discuss weight loss  Underlying COPD - Congratulated on smoking cessation continue Pulmicort 0.5 twice daily DuoNebs  Uncontrolled diabetes mellitus - Worsened secondary to Solu-Medrol and steroids - Patient resumed on glipizide 5, given 15 units of insulin overnight for sugars in the 300s continue moderate sliding scale coverage now  Hypokalemia on admission - Resolved, discontinue K. Dur  DVT prophylaxis: Lovenox Code Status: Full Family Communication: None Disposition:  Status is: Inpatient Remains inpatient appropriate because:   Needs further respiratory support   Subjective: Awake coherent breathing relatively comfortably but desats and increased work of breathing to the 40s off of oxygen Some headache and did not sleep well overnight  Objective: Vitals:   09/30/22 0300 09/30/22 0400 09/30/22 0757 09/30/22 0758  BP: (!) 150/117     Pulse: (!) 110     Resp: (!) 30     Temp:  98.7  F (37.1 C)    TempSrc:  Oral    SpO2: 96%  96% 99%  Weight:      Height:        Intake/Output Summary (Last 24 hours) at 09/30/2022 0826 Last data filed at 09/30/2022 0500 Gross per 24 hour  Intake 800 ml  Output 600 ml  Net 200 ml   Filed Weights   09/28/22 2319 09/29/22 1300  Weight: 61.6 kg 133.9 kg    Examination:  EOMI NCAT thick neck Mallampati 4 CTAB with coarse rales rhonchi and wheezes ROM intact no focal deficit S1-S2 no murmurs some sinus tach at times Neuro intact  Data Reviewed: personally reviewed   CBC    Component Value Date/Time   WBC 12.7 (H) 09/30/2022 0321   RBC 4.54 09/30/2022 0321   HGB 12.0 09/30/2022 0321   HCT 39.1 09/30/2022 0321   PLT 432 (H) 09/30/2022 0321   MCV 86.1 09/30/2022 0321   MCH 26.4 09/30/2022 0321   MCHC 30.7 09/30/2022 0321   RDW 15.8 (H) 09/30/2022 0321   LYMPHSABS 2.7 09/06/2022 2149   MONOABS 1.5 (H) 09/06/2022 2149   EOSABS 0.4 09/06/2022 2149   BASOSABS 0.1 09/06/2022 2149      Latest Ref Rng & Units 09/30/2022    3:21 AM 09/28/2022   11:58 PM 09/10/2022   10:54 AM  CMP  Glucose 70 - 99 mg/dL 178  283  230   BUN 6 - 20 mg/dL '18  10  19   '$ Creatinine 0.44 - 1.00 mg/dL 0.81  0.81  1.03   Sodium 135 - 145 mmol/L 137  133  138   Potassium 3.5 - 5.1 mmol/L 4.0  3.7  3.4   Chloride 98 - 111 mmol/L 101  96  97   CO2 22 - 32 mmol/L 26  26  32   Calcium 8.9 - 10.3 mg/dL 8.8  8.7  9.1   Total Protein 6.5 - 8.1 g/dL 7.4   6.8   Total Bilirubin 0.3 - 1.2 mg/dL 0.3   0.5   Alkaline Phos 38 - 126 U/L 89   86   AST 15 - 41 U/L 50   28   ALT 0 - 44 U/L 52   39      Radiology Studies: CT Angio Chest Pulmonary Embolism (PE) W or WO Contrast  Result Date: 09/29/2022 CLINICAL DATA:  Cough with shortness of breath and wheezing. EXAM: CT ANGIOGRAPHY CHEST WITH CONTRAST TECHNIQUE: Multidetector CT imaging of the chest was performed using the standard protocol during bolus administration of intravenous contrast. Multiplanar CT  image reconstructions and MIPs were obtained to evaluate the vascular anatomy. RADIATION DOSE REDUCTION: This exam was performed according to the departmental dose-optimization program which includes automated exposure control, adjustment of the mA and/or kV according to patient size and/or use of iterative reconstruction technique. CONTRAST:  168m OMNIPAQUE IOHEXOL 350 MG/ML SOLN COMPARISON:  August 14, 2014 FINDINGS: Cardiovascular: The pulmonary arteries are limited in evaluation secondary to suboptimal opacification with intravenous contrast. No evidence of pulmonary embolism. Normal heart size. No pericardial effusion. Mediastinum/Nodes: No enlarged mediastinal, hilar, or axillary lymph nodes. Thyroid gland, trachea, and esophagus demonstrate no significant findings. Lungs/Pleura: Lungs are clear. No pleural effusion or pneumothorax. Upper Abdomen: No acute abnormality. Musculoskeletal: No chest wall abnormality. No acute or significant osseous findings. Review of the MIP images confirms the above findings. IMPRESSION: No evidence of pulmonary embolism or other acute intrathoracic process. Electronically Signed   By: TVirgina NorfolkM.D.   On: 09/29/2022 02:42   DG Chest Port 1 View  Result Date: 09/28/2022 CLINICAL DATA:  sob, cp EXAM: PORTABLE CHEST 1 VIEW COMPARISON:  Chest x-ray 09/10/2022 FINDINGS: The heart and mediastinal contours are within normal limits. No focal consolidation. No pulmonary edema. No pleural effusion. No pneumothorax. No acute osseous abnormality. IMPRESSION: No active disease. Electronically Signed   By: MIven FinnM.D.   On: 09/28/2022 23:33     Scheduled Meds:  atenolol  25 mg Oral Daily   budesonide  0.5 mg Nebulization BID   Chlorhexidine Gluconate Cloth  6 each Topical Daily   enoxaparin (LOVENOX) injection  40 mg Subcutaneous Q24H   glipiZIDE  5 mg Oral QAC breakfast   insulin aspart  0-15 Units Subcutaneous Q4H   insulin glargine-yfgn  15 Units  Subcutaneous Daily   ipratropium-albuterol  3 mL Nebulization TID   methylPREDNISolone (SOLU-MEDROL) injection  40 mg Intravenous Daily   potassium chloride  40 mEq Oral Daily   sodium chloride flush  3 mL Intravenous Q12H   Vitamin D (Ergocalciferol)  50,000 Units Oral Weekly   Continuous Infusions:   LOS: 1 day   Time spent: 3Hart MD [facility] To contact the attending provider between 7A-7P or the covering provider during after hours 7P-7A, please log into the web site [email or web address] and access using universal [place] password for that web site. If you do not have the password, please call the hospital operator.  09/30/2022, 8:26 AM

## 2022-09-30 NOTE — Inpatient Diabetes Management (Signed)
Inpatient Diabetes Program Recommendations  AACE/ADA: New Consensus Statement on Inpatient Glycemic Control (2015)  Target Ranges:  Prepandial:   less than 140 mg/dL      Peak postprandial:   less than 180 mg/dL (1-2 hours)      Critically ill patients:  140 - 180 mg/dL   Lab Results  Component Value Date   GLUCAP 341 (H) 09/30/2022   HGBA1C 8.8 (H) 09/07/2022    Review of Glycemic Control  Latest Reference Range & Units 09/30/22 08:42 09/30/22 11:56  Glucose-Capillary 70 - 99 mg/dL 204 (H) 341 (H)  (H): Data is abnormally high  Diabetes history: DM2 Outpatient Diabetes medications:  Glipizide 5 mg QD Januvia 100 mg -not taking Current orders for Inpatient glycemic control: Novolog 0-15 units Q4H Glipizide 5 mg QD Semglee 15 units QD Solumedrol 40 mg Q12H  Inpatient Diabetes Program Recommendations:    While on steroids, might consider:  Novolog 4 units TID with meals if she consumes at lest 50% DC glipizide  Will continue to follow while inpatient.  Thank you, Reche Dixon, MSN, [facility] Coordinator Inpatient Diabetes Program [phone] (team pager from 8a-5p)

## 2022-10-01 DIAGNOSIS — J09X1 Influenza due to identified novel influenza A virus with pneumonia: Secondary | ICD-10-CM | POA: Diagnosis not present

## 2022-10-01 LAB — GLUCOSE, CAPILLARY
Glucose-Capillary: 105 mg/dL — ABNORMAL HIGH (ref 70–99)
Glucose-Capillary: 192 mg/dL — ABNORMAL HIGH (ref 70–99)
Glucose-Capillary: 233 mg/dL — ABNORMAL HIGH (ref 70–99)
Glucose-Capillary: 273 mg/dL — ABNORMAL HIGH (ref 70–99)
Glucose-Capillary: 384 mg/dL — ABNORMAL HIGH (ref 70–99)
Glucose-Capillary: 430 mg/dL — ABNORMAL HIGH (ref 70–99)
Glucose-Capillary: 465 mg/dL — ABNORMAL HIGH (ref 70–99)
Glucose-Capillary: 467 mg/dL — ABNORMAL HIGH (ref 70–99)

## 2022-10-01 MED ORDER — PREDNISONE 20 MG PO TABS
60.0000 mg | ORAL_TABLET | Freq: Every day | ORAL | Status: DC
  Administered 2022-10-02: 60 mg via ORAL
  Filled 2022-10-01: qty 3

## 2022-10-01 MED ORDER — INSULIN ASPART 100 UNIT/ML IJ SOLN
18.0000 [IU] | Freq: Once | INTRAMUSCULAR | Status: AC
  Administered 2022-10-01: 18 [IU] via SUBCUTANEOUS

## 2022-10-01 MED ORDER — SALINE SPRAY 0.65 % NA SOLN
1.0000 | NASAL | Status: DC | PRN
  Administered 2022-10-01: 1 via NASAL
  Filled 2022-10-01: qty 44

## 2022-10-01 MED ORDER — IBUPROFEN 200 MG PO TABS
600.0000 mg | ORAL_TABLET | Freq: Three times a day (TID) | ORAL | Status: DC
  Administered 2022-10-01 – 2022-10-03 (×7): 600 mg via ORAL
  Filled 2022-10-01 (×7): qty 3

## 2022-10-01 MED ORDER — INSULIN ASPART 100 UNIT/ML IJ SOLN
10.0000 [IU] | Freq: Once | INTRAMUSCULAR | Status: AC
  Administered 2022-10-01: 10 [IU] via SUBCUTANEOUS

## 2022-10-01 MED ORDER — INFLUENZA VAC SPLIT QUAD 0.5 ML IM SUSY: 0.5000 mL | PREFILLED_SYRINGE | INTRAMUSCULAR | Status: DC | PRN
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
## 2022-10-01 NOTE — Progress Notes (Signed)
Spoke with patient at length about the risks and benefits of receiving a yearly flu vaccine and her higher risk status due to preexisting lung conditions. Pt is agreeable and would like to receive the vaccine prior to discharge if appropriate.

## 2022-10-02 DIAGNOSIS — J09X1 Influenza due to identified novel influenza A virus with pneumonia: Secondary | ICD-10-CM | POA: Diagnosis not present

## 2022-10-02 LAB — CBC
HCT: 39.4 % (ref 36.0–46.0)
Hemoglobin: 11.6 g/dL — ABNORMAL LOW (ref 12.0–15.0)
MCH: 26.1 pg (ref 26.0–34.0)
MCHC: 29.4 g/dL — ABNORMAL LOW (ref 30.0–36.0)
MCV: 88.5 fL (ref 80.0–100.0)
Platelets: 455 10*3/uL — ABNORMAL HIGH (ref 150–400)
RBC: 4.45 MIL/uL (ref 3.87–5.11)
RDW: 15.9 % — ABNORMAL HIGH (ref 11.5–15.5)
WBC: 8.6 10*3/uL (ref 4.0–10.5)
nRBC: 0 % (ref 0.0–0.2)

## 2022-10-02 LAB — GLUCOSE, CAPILLARY
Glucose-Capillary: 168 mg/dL — ABNORMAL HIGH (ref 70–99)
Glucose-Capillary: 134 mg/dL — ABNORMAL HIGH (ref 70–99)
Glucose-Capillary: 290 mg/dL — ABNORMAL HIGH (ref 70–99)
Glucose-Capillary: 335 mg/dL — ABNORMAL HIGH (ref 70–99)
Glucose-Capillary: 337 mg/dL — ABNORMAL HIGH (ref 70–99)

## 2022-10-02 LAB — COMPREHENSIVE METABOLIC PANEL
ALT: 58 U/L — ABNORMAL HIGH (ref 0–44)
AST: 33 U/L (ref 15–41)
Albumin: 3.1 g/dL — ABNORMAL LOW (ref 3.5–5.0)
Alkaline Phosphatase: 67 U/L (ref 38–126)
Anion gap: 9 (ref 5–15)
BUN: 16 mg/dL (ref 6–20)
CO2: 30 mmol/L (ref 22–32)
Calcium: 8.8 mg/dL — ABNORMAL LOW (ref 8.9–10.3)
Chloride: 100 mmol/L (ref 98–111)
Creatinine, Ser: 0.73 mg/dL (ref 0.44–1.00)
GFR, Estimated: >60 mL/min (ref >60)
Glucose, Bld: 124 mg/dL — ABNORMAL HIGH (ref 70–99)
Potassium: 3.7 mmol/L (ref 3.5–5.1)
Sodium: 139 mmol/L (ref 135–145)
Total Bilirubin: 0.2 mg/dL — ABNORMAL LOW (ref 0.3–1.2)
Total Protein: 6.6 g/dL (ref 6.5–8.1)

## 2022-10-02 MED ORDER — PREDNISONE 20 MG PO TABS
40.0000 mg | ORAL_TABLET | Freq: Every day | ORAL | Status: DC
  Administered 2022-10-03: 40 mg via ORAL
  Filled 2022-10-02: qty 2

## 2022-10-02 MED ORDER — GUAIFENESIN-DM 100-10 MG/5ML PO SYRP
5.0000 mL | ORAL_SOLUTION | ORAL | Status: DC | PRN
  Administered 2022-10-02 (×3): 5 mL via ORAL
  Filled 2022-10-02 (×3): qty 10
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
## 2022-10-03 ENCOUNTER — Encounter: Payer: Self-pay | Admitting: Family Medicine

## 2022-10-03 DIAGNOSIS — J09X1 Influenza due to identified novel influenza A virus with pneumonia: Secondary | ICD-10-CM | POA: Diagnosis not present

## 2022-10-03 LAB — COMPREHENSIVE METABOLIC PANEL
ALT: 46 U/L — ABNORMAL HIGH (ref 0–44)
AST: 24 U/L (ref 15–41)
Albumin: 2.6 g/dL — ABNORMAL LOW (ref 3.5–5.0)
Alkaline Phosphatase: 61 U/L (ref 38–126)
Anion gap: 7 (ref 5–15)
BUN: 12 mg/dL (ref 6–20)
CO2: 32 mmol/L (ref 22–32)
Calcium: 8 mg/dL — ABNORMAL LOW (ref 8.9–10.3)
Chloride: 98 mmol/L (ref 98–111)
Creatinine, Ser: 0.7 mg/dL (ref 0.44–1.00)
GFR, Estimated: >60 mL/min (ref >60)
Glucose, Bld: 133 mg/dL — ABNORMAL HIGH (ref 70–99)
Potassium: 3.2 mmol/L — ABNORMAL LOW (ref 3.5–5.1)
Sodium: 137 mmol/L (ref 135–145)
Total Bilirubin: 0.3 mg/dL (ref 0.3–1.2)
Total Protein: 5.8 g/dL — ABNORMAL LOW (ref 6.5–8.1)

## 2022-10-03 LAB — CBC
HCT: 37 % (ref 36.0–46.0)
Hemoglobin: 11.3 g/dL — ABNORMAL LOW (ref 12.0–15.0)
MCH: 26.7 pg (ref 26.0–34.0)
MCHC: 30.5 g/dL (ref 30.0–36.0)
MCV: 87.3 fL (ref 80.0–100.0)
Platelets: 444 10*3/uL — ABNORMAL HIGH (ref 150–400)
RBC: 4.24 MIL/uL (ref 3.87–5.11)
RDW: 15.9 % — ABNORMAL HIGH (ref 11.5–15.5)
WBC: 9 10*3/uL (ref 4.0–10.5)
nRBC: 0 % (ref 0.0–0.2)

## 2022-10-03 LAB — GLUCOSE, CAPILLARY
Glucose-Capillary: 108 mg/dL — ABNORMAL HIGH (ref 70–99)
Glucose-Capillary: 122 mg/dL — ABNORMAL HIGH (ref 70–99)
Glucose-Capillary: 165 mg/dL — ABNORMAL HIGH (ref 70–99)
Glucose-Capillary: 171 mg/dL — ABNORMAL HIGH (ref 70–99)

## 2022-10-03 MED ORDER — TIOTROPIUM BROMIDE MONOHYDRATE 18 MCG IN CAPS: 18.0000 ug | ORAL_CAPSULE | Freq: Every day | RESPIRATORY_TRACT | 2 refills | Status: DC

## 2022-10-03 MED ORDER — LEVALBUTEROL TARTRATE 45 MCG/ACT IN AERO: 1.0000 | INHALATION_SPRAY | RESPIRATORY_TRACT | 2 refills | Status: DC | PRN

## 2022-10-03 MED ORDER — BLOOD GLUCOSE MONITOR KIT: PACK | 0 refills | Status: DC

## 2022-10-03 MED ORDER — PREDNISONE 20 MG PO TABS: ORAL_TABLET | ORAL | 0 refills | Status: AC

## 2022-10-03 MED ORDER — ALBUTEROL SULFATE HFA 108 (90 BASE) MCG/ACT IN AERS: 2.0000 | INHALATION_SPRAY | Freq: Four times a day (QID) | RESPIRATORY_TRACT | 2 refills | Status: AC | PRN

## 2022-10-03 MED ORDER — BASAGLAR KWIKPEN 100 UNIT/ML ~~LOC~~ SOPN: 12.0000 [IU] | PEN_INJECTOR | Freq: Every day | SUBCUTANEOUS | 0 refills | Status: DC

## 2022-10-03 MED ORDER — OSELTAMIVIR PHOSPHATE 75 MG PO CAPS: 75.0000 mg | ORAL_CAPSULE | Freq: Two times a day (BID) | ORAL | 0 refills | Status: AC
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
## 2022-10-03 NOTE — Progress Notes (Signed)
SATURATION QUALIFICATIONS: (This note is used to comply with regulatory documentation for home oxygen)  Patient Saturations on Room Air at Rest = 91%  Patient Saturations on Room Air while Ambulating = 84%  Patient Saturations on 2 Liters of oxygen while Ambulating = 90%  Please briefly explain why patient needs home oxygen:  Patient will need 2L [place] while ambulating.

## 2022-10-03 NOTE — TOC Transition Note (Addendum)
Transition of Care [facility]) - CM/SW Discharge Note   Patient Details  Name: Katherine Douglas MRN: [id number] Date of Birth: [birth year]  [facility] CM/SW Contact:  Angelita Ingles, RN Phone Number:[phone]  10/03/2022, 11:47 AM   Clinical Narrative:    Patient to discharge home with home O2. Order has been called in to [facility]. [employer] will need MD to add nurses saturation reading to the d/c summary before O2 can be delivered. CM has notified nurse and MD awaiting updated d/c summary.   1202 MD note has been updated. Home O2 to be delivered to bedside.      Barriers to Discharge: Continued Medical Work up   Patient Goals and CMS Choice   Choice offered to / list presented to : NA  Discharge Placement                         Discharge Plan and Services Additional resources added to the After Visit Summary for   In-house Referral: NA Discharge Planning Services: CM Consult Post Acute Care Choice: NA                               Social Determinants of Health (SDOH) Interventions SDOH Screenings   Food Insecurity: Food Insecurity Present (10/01/2022)  Housing: High Risk (10/01/2022)  Transportation Needs: No Transportation Needs (10/01/2022)  Recent Concern: Transportation Needs - Unmet Transportation Needs (09/07/2022)  Utilities: Not At Risk (10/01/2022)  Recent Concern: Utilities - At Risk (09/07/2022)  Tobacco Use: Medium Risk (09/29/2022)     Readmission Risk Interventions     No data to display

## 2022-10-05 DIAGNOSIS — M62838 Other muscle spasm: Secondary | ICD-10-CM | POA: Diagnosis not present

## 2022-10-05 DIAGNOSIS — M25562 Pain in left knee: Secondary | ICD-10-CM | POA: Diagnosis not present

## 2022-10-05 DIAGNOSIS — M25571 Pain in right ankle and joints of right foot: Secondary | ICD-10-CM | POA: Diagnosis not present

## 2022-10-05 DIAGNOSIS — Z79899 Other long term (current) drug therapy: Secondary | ICD-10-CM | POA: Diagnosis not present

## 2022-10-05 DIAGNOSIS — E119 Type 2 diabetes mellitus without complications: Secondary | ICD-10-CM | POA: Diagnosis not present

## 2022-10-07 DIAGNOSIS — J440 Chronic obstructive pulmonary disease with acute lower respiratory infection: Secondary | ICD-10-CM | POA: Diagnosis not present

## 2022-10-07 DIAGNOSIS — J9601 Acute respiratory failure with hypoxia: Secondary | ICD-10-CM | POA: Diagnosis not present

## 2022-10-07 DIAGNOSIS — Z79899 Other long term (current) drug therapy: Secondary | ICD-10-CM | POA: Diagnosis not present

## 2022-10-27 DIAGNOSIS — J9601 Acute respiratory failure with hypoxia: Secondary | ICD-10-CM | POA: Diagnosis not present

## 2022-10-27 DIAGNOSIS — J440 Chronic obstructive pulmonary disease with acute lower respiratory infection: Secondary | ICD-10-CM | POA: Diagnosis not present

## 2022-11-16 DIAGNOSIS — Z1231 Encounter for screening mammogram for malignant neoplasm of breast: Secondary | ICD-10-CM

## 2022-11-17 DIAGNOSIS — E119 Type 2 diabetes mellitus without complications: Secondary | ICD-10-CM | POA: Diagnosis not present

## 2022-11-17 DIAGNOSIS — I10 Essential (primary) hypertension: Secondary | ICD-10-CM | POA: Diagnosis not present

## 2022-11-17 DIAGNOSIS — R030 Elevated blood-pressure reading, without diagnosis of hypertension: Secondary | ICD-10-CM | POA: Diagnosis not present

## 2022-11-17 DIAGNOSIS — E789 Disorder of lipoprotein metabolism, unspecified: Secondary | ICD-10-CM | POA: Diagnosis not present

## 2022-11-25 DIAGNOSIS — J9601 Acute respiratory failure with hypoxia: Secondary | ICD-10-CM | POA: Diagnosis not present

## 2022-11-25 DIAGNOSIS — J440 Chronic obstructive pulmonary disease with acute lower respiratory infection: Secondary | ICD-10-CM | POA: Diagnosis not present

## 2022-11-29 DIAGNOSIS — Z0000 Encounter for general adult medical examination without abnormal findings: Secondary | ICD-10-CM | POA: Diagnosis not present

## 2022-11-29 DIAGNOSIS — R5383 Other fatigue: Secondary | ICD-10-CM | POA: Diagnosis not present

## 2022-11-29 DIAGNOSIS — E789 Disorder of lipoprotein metabolism, unspecified: Secondary | ICD-10-CM | POA: Diagnosis not present

## 2022-11-29 DIAGNOSIS — I10 Essential (primary) hypertension: Secondary | ICD-10-CM | POA: Diagnosis not present

## 2022-11-29 DIAGNOSIS — E119 Type 2 diabetes mellitus without complications: Secondary | ICD-10-CM | POA: Diagnosis not present

## 2022-12-15 DIAGNOSIS — E119 Type 2 diabetes mellitus without complications: Secondary | ICD-10-CM | POA: Diagnosis not present

## 2022-12-15 DIAGNOSIS — Z0000 Encounter for general adult medical examination without abnormal findings: Secondary | ICD-10-CM | POA: Diagnosis not present

## 2022-12-15 DIAGNOSIS — I10 Essential (primary) hypertension: Secondary | ICD-10-CM | POA: Diagnosis not present

## 2022-12-15 DIAGNOSIS — E789 Disorder of lipoprotein metabolism, unspecified: Secondary | ICD-10-CM | POA: Diagnosis not present

## 2022-12-20 DIAGNOSIS — R0602 Shortness of breath: Secondary | ICD-10-CM | POA: Diagnosis not present

## 2022-12-20 DIAGNOSIS — Z3200 Encounter for pregnancy test, result unknown: Secondary | ICD-10-CM | POA: Diagnosis not present

## 2022-12-20 DIAGNOSIS — E119 Type 2 diabetes mellitus without complications: Secondary | ICD-10-CM | POA: Diagnosis not present

## 2022-12-20 DIAGNOSIS — Z6841 Body Mass Index (BMI) 40.0 and over, adult: Secondary | ICD-10-CM | POA: Diagnosis not present

## 2022-12-21 ENCOUNTER — Ambulatory Visit: Payer: Medicaid Other | Admitting: Obstetrics

## 2022-12-26 DIAGNOSIS — J441 Chronic obstructive pulmonary disease with (acute) exacerbation: Secondary | ICD-10-CM | POA: Diagnosis not present

## 2022-12-30 DIAGNOSIS — R002 Palpitations: Secondary | ICD-10-CM | POA: Diagnosis not present

## 2022-12-30 DIAGNOSIS — R0602 Shortness of breath: Secondary | ICD-10-CM | POA: Diagnosis not present

## 2023-01-20 DIAGNOSIS — Z79899 Other long term (current) drug therapy: Secondary | ICD-10-CM | POA: Diagnosis not present

## 2023-01-20 DIAGNOSIS — R892 Abnormal level of other drugs, medicaments and biological substances in specimens from other organs, systems and tissues: Secondary | ICD-10-CM | POA: Diagnosis not present

## 2023-01-20 DIAGNOSIS — Z6841 Body Mass Index (BMI) 40.0 and over, adult: Secondary | ICD-10-CM | POA: Diagnosis not present

## 2023-02-07 ENCOUNTER — Other Ambulatory Visit: Payer: Self-pay | Admitting: Oral Surgery

## 2023-07-03 ENCOUNTER — Ambulatory Visit: Payer: 59 | Admitting: Family Medicine

## 2023-09-06 ENCOUNTER — Encounter (HOSPITAL_COMMUNITY): Payer: Self-pay

## 2023-09-06 ENCOUNTER — Emergency Department (HOSPITAL_COMMUNITY)
Admission: EM | Admit: 2023-09-06 | Discharge: 2023-09-06 | Disposition: A | Discharge status: Home / Self Care | Payer: 59 | Attending: Emergency Medicine | Admitting: Emergency Medicine

## 2023-09-06 ENCOUNTER — Other Ambulatory Visit: Payer: Self-pay

## 2023-09-06 ENCOUNTER — Emergency Department (HOSPITAL_COMMUNITY): Payer: 59

## 2023-09-06 DIAGNOSIS — R519 Headache, unspecified: Secondary | ICD-10-CM | POA: Insufficient documentation

## 2023-09-06 DIAGNOSIS — Z794 Long term (current) use of insulin: Secondary | ICD-10-CM | POA: Insufficient documentation

## 2023-09-06 DIAGNOSIS — I1A0 Resistant hypertension: Secondary | ICD-10-CM | POA: Diagnosis not present

## 2023-09-06 DIAGNOSIS — Z91010 Allergy to peanuts: Secondary | ICD-10-CM | POA: Insufficient documentation

## 2023-09-06 DIAGNOSIS — I10 Essential (primary) hypertension: Secondary | ICD-10-CM | POA: Insufficient documentation

## 2023-09-06 DIAGNOSIS — R079 Chest pain, unspecified: Secondary | ICD-10-CM | POA: Diagnosis not present

## 2023-09-06 DIAGNOSIS — I6782 Cerebral ischemia: Secondary | ICD-10-CM | POA: Diagnosis not present

## 2023-09-06 DIAGNOSIS — H538 Other visual disturbances: Secondary | ICD-10-CM | POA: Diagnosis not present

## 2023-09-06 DIAGNOSIS — R29818 Other symptoms and signs involving the nervous system: Secondary | ICD-10-CM | POA: Diagnosis not present

## 2023-09-06 LAB — CBC
HCT: 33.9 % — ABNORMAL LOW (ref 36.0–46.0)
Hemoglobin: 10.2 g/dL — ABNORMAL LOW (ref 12.0–15.0)
MCH: 23.8 pg — ABNORMAL LOW (ref 26.0–34.0)
MCHC: 30.1 g/dL (ref 30.0–36.0)
MCV: 79 fL — ABNORMAL LOW (ref 80.0–100.0)
Platelets: 658 10*3/uL — ABNORMAL HIGH (ref 150–400)
RBC: 4.29 MIL/uL (ref 3.87–5.11)
RDW: 16.2 % — ABNORMAL HIGH (ref 11.5–15.5)
WBC: 12.8 10*3/uL — ABNORMAL HIGH (ref 4.0–10.5)
nRBC: 0 % (ref 0.0–0.2)

## 2023-09-06 LAB — TROPONIN I (HIGH SENSITIVITY)
Troponin I (High Sensitivity): 3 ng/L (ref <18)
Troponin I (High Sensitivity): 3 ng/L (ref <18)

## 2023-09-06 LAB — BASIC METABOLIC PANEL
Anion gap: 6 (ref 5–15)
BUN: 13 mg/dL (ref 6–20)
CO2: 26 mmol/L (ref 22–32)
Calcium: 8.2 mg/dL — ABNORMAL LOW (ref 8.9–10.3)
Chloride: 101 mmol/L (ref 98–111)
Creatinine, Ser: 0.69 mg/dL (ref 0.44–1.00)
GFR, Estimated: >60 mL/min (ref >60)
Glucose, Bld: 102 mg/dL — ABNORMAL HIGH (ref 70–99)
Potassium: 3.4 mmol/L — ABNORMAL LOW (ref 3.5–5.1)
Sodium: 133 mmol/L — ABNORMAL LOW (ref 135–145)

## 2023-09-06 LAB — HCG, SERUM, QUALITATIVE: Preg, Serum: NEGATIVE

## 2023-09-06 MED ORDER — LOSARTAN POTASSIUM 50 MG PO TABS: 50.0000 mg | ORAL_TABLET | Freq: Every day | ORAL | 0 refills | Status: DC

## 2023-09-06 MED ORDER — METOCLOPRAMIDE HCL 5 MG/ML IJ SOLN
10.0000 mg | Freq: Once | INTRAMUSCULAR | Status: AC
  Administered 2023-09-06: 10 mg via INTRAVENOUS
  Filled 2023-09-06: qty 2

## 2023-09-06 MED ORDER — DIPHENHYDRAMINE HCL 50 MG/ML IJ SOLN
25.0000 mg | Freq: Once | INTRAMUSCULAR | Status: AC
  Administered 2023-09-06: 25 mg via INTRAVENOUS
  Filled 2023-09-06: qty 1

## 2023-09-06 MED ORDER — HYDRALAZINE HCL 20 MG/ML IJ SOLN
10.0000 mg | Freq: Once | INTRAMUSCULAR | Status: AC
  Administered 2023-09-06: 10 mg via INTRAVENOUS
  Filled 2023-09-06: qty 1

## 2023-09-06 NOTE — ED Notes (Signed)
Unable to obtain labs 

## 2023-09-06 NOTE — ED Notes (Signed)
Pt resting and in no obvious distress at this time

## 2023-09-06 NOTE — ED Provider Notes (Signed)
[place] EMERGENCY DEPARTMENT AT [facility] Provider Note   CSN: [id number] Arrival date & time: 09/06/23  1355     History  Chief Complaint  Patient presents with   Chest Pain    Katherine Douglas is a 42 y.o. female history of hypertension, here presenting with double vision and headaches and chest pain.  Patient states that she has been having headache and double vision for the last month or so.  She states that it is often worse when she first wakes up.  She states that she sometimes has to rest her eyes for several minutes before she can drive.  She was told to come in about a week ago by her mother but she decided to come in today.  She states that since the last 2 to 3 days she has been having some chest pain.  She states that it is central in nature and is not worsened with exertion.  She has no history of CAD or stents.  She was noted to be hypertensive and she states that she is compliant with her losartan.  The history is provided by the patient.       Home Medications Prior to Admission medications   Medication Sig Start Date End Date Taking? Authorizing Provider  albuterol (VENTOLIN HFA) 108 (90 Base) MCG/ACT inhaler Inhale 2 puffs into the lungs every 6 (six) hours as needed for wheezing or shortness of breath. 10/03/22   Rhetta Mura, MD  atenolol-chlorthalidone (TENORETIC) 50-25 MG tablet Take 1 tablet by mouth daily. 02/26/20   [provider]  blood glucose meter kit and supplies KIT Dispense based on patient and insurance preference. Use up to four times daily as directed. 10/03/22   Rhetta Mura, MD  etonogestrel (NEXPLANON) 68 MG IMPL implant 1 each by Subdermal route once.    [provider]  glipiZIDE (GLUCOTROL) 5 MG tablet Take 1 tablet (5 mg total) by mouth daily before breakfast. 09/12/22 12/11/22  Dahal, Melina Schools, MD  Insulin Glargine (BASAGLAR KWIKPEN) 100 UNIT/ML Inject 12 Units into the skin daily. 10/03/22    Rhetta Mura, MD  QULIPTA 60 MG TABS Take 60 mg by mouth daily as needed (migraines). 06/07/22   [provider]  tiotropium (SPIRIVA HANDIHALER) 18 MCG inhalation capsule Place 1 capsule (18 mcg total) into inhaler and inhale daily. 10/03/22 10/03/23  Rhetta Mura, MD  tiZANidine (ZANAFLEX) 4 MG tablet Take 4 mg by mouth 3 (three) times daily as needed for muscle spasms. 08/02/22   [provider]  Vitamin D, Ergocalciferol, (DRISDOL) 1.25 MG (50000 UNIT) CAPS capsule Take 50,000 Units by mouth once a week. 09/06/22   [provider]      Allergies    Other, Amoxicillin, Peanut-containing drug products, Penicillins, and Strawberry extract    Review of Systems   Review of Systems  Cardiovascular:  Positive for chest pain.  Neurological:  Positive for headaches.  All other systems reviewed and are negative.   Physical Exam Updated Vital Signs BP (!) 177/134 (BP Location: Left Arm)   Pulse (!) 105   Temp 97.7 F (36.5 C) (Oral)   Resp 15   Ht 5\' 2"  (1.575 m)   Wt 131.1 kg   LMP 08/31/2023   SpO2 99%   BMI 52.86 kg/m  Physical Exam Vitals and nursing note reviewed.  Constitutional:      Appearance: She is well-developed.  HENT:     Head: Normocephalic.  Eyes:     Extraocular  Movements: Extraocular movements intact.     Pupils: Pupils are equal, round, and reactive to light.  Cardiovascular:     Rate and Rhythm: Normal rate and regular rhythm.     Heart sounds: Normal heart sounds.  Pulmonary:     Effort: Pulmonary effort is normal.     Breath sounds: Normal breath sounds.  Abdominal:     General: Bowel sounds are normal.     Palpations: Abdomen is soft.  Musculoskeletal:        General: Normal range of motion.     Cervical back: Normal range of motion and neck supple.  Skin:    General: Skin is warm.     Capillary Refill: Capillary refill takes less than 2 seconds.  Neurological:     General: No focal deficit present.      Mental Status: She is alert and oriented to person, place, and time.     Comments: No obvious facial droop.  Cranial nerves intact except she appears to have double vision vertically.  I do not see any nystagmus however.  Patient has normal strength and sensation bilateral arms and legs  Psychiatric:        Mood and Affect: Mood normal.        Behavior: Behavior normal.     ED Results / Procedures / Treatments   Labs (all labs ordered are listed, but only abnormal results are displayed) Labs Reviewed  BASIC METABOLIC PANEL  CBC  HCG, SERUM, QUALITATIVE  TROPONIN I (HIGH SENSITIVITY)  TROPONIN I (HIGH SENSITIVITY)    EKG EKG Interpretation Date/Time:  Wednesday September 06 2023 14:05:17 EST Ventricular Rate:  101 PR Interval:  162 QRS Duration:  75 QT Interval:  344 QTC Calculation: 446 R Axis:   79  Text Interpretation: Sinus tachycardia Borderline T abnormalities, inferior leads Confirmed by Alvester Chou [phone]) on 09/06/2023 3:55:16 PM  Radiology DG Chest 2 View  Result Date: 09/06/2023 CLINICAL DATA:  Chest pain EXAM: CHEST - 2 VIEW COMPARISON:  09/28/2022 FINDINGS: The heart size and mediastinal contours are within normal limits. Both lungs are clear. The visualized skeletal structures are unremarkable. IMPRESSION: No active cardiopulmonary disease. Electronically Signed   By: Judie Petit.  Shick M.D.   On: 09/06/2023 14:55    Procedures Procedures    Angiocath insertion Performed by: Richardean Canal  Consent: Verbal consent obtained. Risks and benefits: risks, benefits and alternatives were discussed Time out: Immediately prior to procedure a "time out" was called to verify the correct patient, procedure, equipment, support staff and site/side marked as required.  Preparation: Patient was prepped and draped in the usual sterile fashion.  Vein Location: R antecube  Ultrasound Guided  Gauge: 20 long   Normal blood return and flush without difficulty Patient tolerance:  Patient tolerated the procedure well with no immediate complications.    Medications Ordered in ED Medications  hydrALAZINE (APRESOLINE) injection 10 mg (has no administration in time range)  metoCLOPramide (REGLAN) injection 10 mg (has no administration in time range)  diphenhydrAMINE (BENADRYL) injection 25 mg (has no administration in time range)    ED Course/ Medical Decision Making/ A&P                                 Medical Decision Making ALACIA PASTOREK is a 42 y.o. female here presenting with chest pain and blurry vision.  Blurry vision and headache for about a week month or  so.  Patient does have double vision as well.  However I do not appreciate any nystagmus.  I wonder if she has a complex migraine versus symptomatic hypertension versus hypertensive bleed.  Will get CT head to rule out a bleed.  Will also get CBC and BMP and troponin x 2.  I have low suspicion for dissection.  8:43 PM I reviewed patient's labs and independently interpreted imaging studies.  Troponin negative x 2.  CT head was reviewed by me and showed no bleeding or mass.  Blood pressure is down to 149/90.  Patient is on losartan 50 mg daily.  Will increase starting to 100 mg daily.  Patient states that her headache and blurry vision improved.  She is no longer seeing double.  I wonder if she has a component of complex migraine.  She has seen ophthalmology in the past and I encouraged her to follow back up with her ophthalmologist.  I also encouraged her to see PCP in a week.  Problems Addressed: Blurry vision, bilateral: acute illness or injury Nonintractable headache, unspecified chronicity pattern, unspecified headache type: acute illness or injury Resistant hypertension: acute illness or injury  Amount and/or Complexity of Data Reviewed Labs: ordered. Decision-making details documented in ED Course. Radiology: ordered and independent interpretation performed. Decision-making details documented in ED  Course.  Risk Prescription drug management.    Final Clinical Impression(s) / ED Diagnoses Final diagnoses:  None    Rx / DC Orders ED Discharge Orders     None         Charlynne Pander, MD 09/06/23 2044

## 2023-09-06 NOTE — Discharge Instructions (Addendum)
You likely have complex migraine and uncontrolled hypertension.  You need to continue taking your losartan 50 mg and I prescribed extra 50 mg so you need to take a total of 100 mg daily.  I recommend you follow-up with your ophthalmologist regarding your blurry vision  I also recommend that you follow-up with your primary care doctor in a week to recheck your blood pressure  Return to ER if you have severe headaches or blurry vision or trouble walking or blood pressure over 200 or chest pain

## 2023-09-06 NOTE — ED Triage Notes (Signed)
Hypertension at home, midsternal chest pain that does not radiate. C/o nausea, dizziness, intermittent sob, no sob today.

## 2023-09-25 ENCOUNTER — Telehealth: Payer: Self-pay

## 2023-09-25 NOTE — Progress Notes (Signed)
Transition Care Management Follow-up Telephone Call Date of discharge and from where: Katherine Douglas 12/11 How have you been since you were released from the hospital? Patient is doing well and has an appointment set to follow up wit PCP Any questions or concerns? No  Items Reviewed: Did the pt receive and understand the discharge instructions provided? Yes  Medications obtained and verified? Yes  Other? No  Any new allergies since your discharge? No  Dietary orders reviewed? No Do you have support at home? Yes     Follow up appointments reviewed:  PCP Hospital f/u appt confirmed? Yes  Scheduled to see on  @ . Specialist Hospital f/u appt confirmed? No  Scheduled to see  on  @ . Are transportation arrangements needed? No  If their condition worsens, is the pt aware to call PCP or go to the Emergency Dept.? Yes Was the patient provided with contact information for the PCP's office or ED? Yes Was to pt encouraged to call back with questions or concerns? Yes

## 2023-12-05 ENCOUNTER — Ambulatory Visit: Payer: 59 | Admitting: Obstetrics & Gynecology

## 2023-12-29 ENCOUNTER — Ambulatory Visit: Admitting: Physician Assistant

## 2024-01-09 NOTE — Progress Notes (Deleted)
 NEW GYNECOLOGY PATIENT Patient name: Katherine Douglas MRN [id number]  Date of birth: 06-07-1981 Chief Complaint:   No chief complaint on file.     History:  Katherine Douglas is a 43 y.o. [phone] being seen today for ***.         Gynecologic History No LMP recorded. Patient has had an implant. Contraception: {method:5051} Last Pap: 2019 NILM, HPV neg Last Mammogram:  none seen on file Last Colonoscopy: n/a  Obstetric History OB History  Gravida Para Term Preterm AB Living  5 3 3  2 3   SAB IAB Ectopic Multiple Live Births   2  0 3    # Outcome Date GA Lbr Len/2nd Weight Sex Type Anes PTL Lv  5 Term 04/15/15 [redacted]w[redacted]d 09:28 / 00:26 6 lb 11 oz (3.033 kg) F Vag-Spont EPI  LIV  4 Term 06/15/06 [redacted]w[redacted]d  6 lb 9 oz (2.977 kg) M Vag-Spont EPI N LIV  3 IAB 02/2003          2 IAB 09/2002     TAB     1 Term 09/25/00 [redacted]w[redacted]d  6 lb 13 oz (3.09 kg) F Vag-Spont EPI N LIV    Past Medical History:  Diagnosis Date   Asthma    Depression    Diabetes mellitus without complication (HCC)    pt states was gestational with previous pregnancy   Headache    Hypertension    Panic attacks    Pneumonia    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 8 g 2   atenolol-chlorthalidone (TENORETIC) 50-25 MG tablet Take 1 tablet by mouth daily.     blood glucose meter kit and supplies KIT Dispense based on patient and insurance preference. Use up to four times daily as directed. 1 each 0   etonogestrel (NEXPLANON) 68 MG IMPL implant 1 each by Subdermal route once.     glipiZIDE (GLUCOTROL) 5 MG tablet Take 1 tablet (5 mg total) by mouth daily before breakfast. 30 tablet 2   Insulin Glargine  (BASAGLAR KWIKPEN) 100 UNIT/ML Inject 12 Units into the skin daily. 15 mL 0   losartan (COZAAR) 50 MG tablet Take 1 tablet (50 mg total) by mouth daily. 30 tablet 0   QULIPTA 60 MG TABS Take 60 mg by mouth daily as needed (migraines).     tiotropium (SPIRIVA HANDIHALER) 18 MCG inhalation capsule Place 1 capsule (18 mcg total) into inhaler and inhale daily. 30 capsule 2   tiZANidine (ZANAFLEX) 4 MG tablet Take 4 mg by mouth 3 (three) times daily as needed for muscle spasms.     Vitamin D, Ergocalciferol, (DRISDOL) 1.25 MG (50000 UNIT) CAPS capsule Take 50,000 Units by mouth once a week.     No current facility-administered medications on file prior to visit.    Allergies  Allergen Reactions   Other Anaphylaxis    Crawfish    Amoxicillin Hives    Has patient had a PCN reaction causing immediate rash, facial/tongue/throat swelling, SOB or lightheadedness with hypotension:unsure Has patient had a PCN reaction causing severe rash involving mucus membranes or skin necrosis:No Has patient had a PCN reaction  that required hospitalization:No Has patient had a PCN reaction occurring within the last 10 years: No If all of the above answers are "NO", then may proceed with Cephalosporin use.    Peanut-Containing Drug Products Itching   Penicillins Itching    Has patient had a PCN reaction causing immediate rash, facial/tongue/throat swelling, SOB or lightheadedness with hypotension:unsure Has patient had a PCN reaction causing severe rash involving mucus membranes or skin necrosis:No Has patient had a PCN reaction that required hospitalization:No Has patient had a PCN reaction occurring within the last 10 years: No If all of the above answers are "NO", then may proceed with Cephalosporin use.     Strawberry Extract Hives, Itching and Rash    Social History:  reports that she quit smoking about 9 years ago. Her smoking use included cigarettes. She started smoking about 27 years ago. She has a 4.5  pack-year smoking history. She has never used smokeless tobacco. She reports that she does not currently use alcohol. She reports current drug use. Frequency: 4.00 times per week. Drug: Marijuana.  Family History  Problem Relation Age of Onset   Hypertension Mother    Heart failure Mother    Diabetes Father    CAD Other     The following portions of the patient's history were reviewed and updated as appropriate: allergies, current medications, past family history, past medical history, past social history, past surgical history and problem list.  Review of Systems Pertinent items noted in HPI and remainder of comprehensive ROS otherwise negative.  Physical Exam:  There were no vitals taken for this visit. Physical Exam     Assessment and Plan:   There are no diagnoses linked to this encounter.   Routine preventative health maintenance measures emphasized. Please refer to After Visit Summary for other counseling recommendations.   Follow-up: No follow-ups on file.      Kiki Pelton, MD Obstetrician & Gynecologist, Faculty Practice [facility] [employer], [facility]

## 2024-01-10 ENCOUNTER — Ambulatory Visit: Admitting: Obstetrics and Gynecology

## 2024-05-01 ENCOUNTER — Ambulatory Visit (INDEPENDENT_AMBULATORY_CARE_PROVIDER_SITE_OTHER): Admitting: Obstetrics & Gynecology

## 2024-05-01 ENCOUNTER — Encounter: Payer: Self-pay | Admitting: Obstetrics & Gynecology

## 2024-05-01 VITALS — BP 130/77 | HR 107 | Ht 62.0 in | Wt 295.0 lb

## 2024-05-01 DIAGNOSIS — Z3046 Encounter for surveillance of implantable subdermal contraceptive: Secondary | ICD-10-CM | POA: Diagnosis not present

## 2024-05-01 MED ORDER — TRANEXAMIC ACID 650 MG PO TABS: 1300.0000 mg | ORAL_TABLET | Freq: Three times a day (TID) | ORAL | 2 refills | Status: AC

## 2024-05-01 NOTE — Progress Notes (Addendum)
 43 y.o. GYN presents for Nexplanon removal. C/o HA.  Pt needs AEX/PAP/Mammogram  Last PAP 07/16/2018  Unable to palpate the device in the Patient's arm. Referral made to general surgery to evaluate for removal of Nexplanon  15 minutes face to face and coordination of care  Eveline Lynwood MATSU, MD

## 2024-05-11 ENCOUNTER — Emergency Department (HOSPITAL_COMMUNITY)

## 2024-05-11 ENCOUNTER — Other Ambulatory Visit: Payer: Self-pay

## 2024-05-11 ENCOUNTER — Emergency Department (HOSPITAL_COMMUNITY)
Admission: EM | Admit: 2024-05-11 | Discharge: 2024-05-11 | Disposition: A | Discharge status: Home / Self Care | Attending: Emergency Medicine | Admitting: Emergency Medicine

## 2024-05-11 ENCOUNTER — Encounter (HOSPITAL_COMMUNITY): Payer: Self-pay | Admitting: Emergency Medicine

## 2024-05-11 DIAGNOSIS — R600 Localized edema: Secondary | ICD-10-CM | POA: Insufficient documentation

## 2024-05-11 DIAGNOSIS — J449 Chronic obstructive pulmonary disease, unspecified: Secondary | ICD-10-CM | POA: Insufficient documentation

## 2024-05-11 DIAGNOSIS — Z91010 Allergy to peanuts: Secondary | ICD-10-CM | POA: Insufficient documentation

## 2024-05-11 DIAGNOSIS — Z7984 Long term (current) use of oral hypoglycemic drugs: Secondary | ICD-10-CM | POA: Diagnosis not present

## 2024-05-11 DIAGNOSIS — Z794 Long term (current) use of insulin: Secondary | ICD-10-CM | POA: Insufficient documentation

## 2024-05-11 DIAGNOSIS — R0789 Other chest pain: Secondary | ICD-10-CM | POA: Insufficient documentation

## 2024-05-11 DIAGNOSIS — Z79899 Other long term (current) drug therapy: Secondary | ICD-10-CM | POA: Diagnosis not present

## 2024-05-11 DIAGNOSIS — I160 Hypertensive urgency: Secondary | ICD-10-CM | POA: Insufficient documentation

## 2024-05-11 DIAGNOSIS — E1165 Type 2 diabetes mellitus with hyperglycemia: Secondary | ICD-10-CM | POA: Insufficient documentation

## 2024-05-11 DIAGNOSIS — R519 Headache, unspecified: Secondary | ICD-10-CM | POA: Diagnosis present

## 2024-05-11 LAB — TROPONIN I (HIGH SENSITIVITY)
Troponin I (High Sensitivity): 5 ng/L (ref <18)
Troponin I (High Sensitivity): 5 ng/L (ref <18)

## 2024-05-11 LAB — BASIC METABOLIC PANEL WITH GFR
Anion gap: 9 (ref 5–15)
BUN: 8 mg/dL (ref 6–20)
CO2: 25 mmol/L (ref 22–32)
Calcium: 8.8 mg/dL — ABNORMAL LOW (ref 8.9–10.3)
Chloride: 103 mmol/L (ref 98–111)
Creatinine, Ser: 0.64 mg/dL (ref 0.44–1.00)
GFR, Estimated: >60 mL/min (ref >60)
Glucose, Bld: 141 mg/dL — ABNORMAL HIGH (ref 70–99)
Potassium: 3.7 mmol/L (ref 3.5–5.1)
Sodium: 137 mmol/L (ref 135–145)

## 2024-05-11 LAB — CBC
HCT: 35.8 % — ABNORMAL LOW (ref 36.0–46.0)
Hemoglobin: 9.7 g/dL — ABNORMAL LOW (ref 12.0–15.0)
MCH: 20.4 pg — ABNORMAL LOW (ref 26.0–34.0)
MCHC: 27.1 g/dL — ABNORMAL LOW (ref 30.0–36.0)
MCV: 75.4 fL — ABNORMAL LOW (ref 80.0–100.0)
Platelets: 607 K/uL — ABNORMAL HIGH (ref 150–400)
RBC: 4.75 MIL/uL (ref 3.87–5.11)
RDW: 18.3 % — ABNORMAL HIGH (ref 11.5–15.5)
WBC: 11.9 K/uL — ABNORMAL HIGH (ref 4.0–10.5)
nRBC: 0 % (ref 0.0–0.2)

## 2024-05-11 LAB — HCG, SERUM, QUALITATIVE: Preg, Serum: NEGATIVE

## 2024-05-11 MED ORDER — METOCLOPRAMIDE HCL 5 MG/ML IJ SOLN
10.0000 mg | Freq: Once | INTRAMUSCULAR | Status: AC
  Administered 2024-05-11: 10 mg via INTRAVENOUS
  Filled 2024-05-11: qty 2

## 2024-05-11 MED ORDER — DIPHENHYDRAMINE HCL 50 MG/ML IJ SOLN
25.0000 mg | Freq: Once | INTRAMUSCULAR | Status: AC
  Administered 2024-05-11: 25 mg via INTRAVENOUS
  Filled 2024-05-11: qty 1

## 2024-05-11 MED ORDER — ATENOLOL 50 MG PO TABS
50.0000 mg | ORAL_TABLET | Freq: Once | ORAL | Status: AC
  Administered 2024-05-11: 50 mg via ORAL
  Filled 2024-05-11: qty 1

## 2024-05-11 MED ORDER — HYDRALAZINE HCL 20 MG/ML IJ SOLN
10.0000 mg | Freq: Once | INTRAMUSCULAR | Status: AC
  Administered 2024-05-11: 10 mg via INTRAVENOUS
  Filled 2024-05-11: qty 1
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
## 2024-05-11 NOTE — ED Triage Notes (Signed)
 Patient BIB Officer from detention center c/o chest pain and SOB x 1 week. Patient report worsening chest pain and hypertension 186/126 while checking in at detention center. Patient report nausea, denies vomiting. Patient report headache and double vision at triage.

## 2024-05-20 ENCOUNTER — Ambulatory Visit
Admission: RE | Admit: 2024-05-20 | Discharge: 2024-05-20 | Disposition: A | Discharge status: Home / Self Care | Source: Ambulatory Visit | Attending: General Surgery | Admitting: General Surgery

## 2024-05-20 ENCOUNTER — Other Ambulatory Visit: Payer: Self-pay | Admitting: General Surgery

## 2024-05-20 DIAGNOSIS — Z975 Presence of (intrauterine) contraceptive device: Secondary | ICD-10-CM

## 2024-05-28 ENCOUNTER — Ambulatory Visit: Payer: Self-pay | Admitting: General Surgery

## 2024-06-03 ENCOUNTER — Ambulatory Visit: Admitting: Obstetrics

## 2024-06-06 ENCOUNTER — Ambulatory Visit: Admitting: Obstetrics

## 2024-06-07 NOTE — Progress Notes (Addendum)
 PCP - [facility]  Dr. Joshua Cardiologist - Dr. Jeronimo Keen  PPM/ICD -  Device Orders -  Rep Notified -   Chest x-ray - 05-11-24 epic EKG - 05-15-24 epic Stress Test -  ECHO - 09-10-22 epic Cardiac Cath -   Sleep Study -  CPAP -   Fasting Blood Sugar - 90's Checks Blood Sugar __2___ times a day  Blood Thinner Instructions: Aspirin Instructions:  ERAS Protcol - PRE-SURGERY n/a  OZEMPIC- HASNT TAKEN IN ABOUT 3 WEEKS. ADVISED NOT TO TAKE  BEFORE SURGERY   COVID vaccine -  Activity--Able to climb a flight of stairs with no CP or SOB Anesthesia review: HTN,Asthma, DM2, Pt. BP elevated at preop pt. Harlene Ward PA-C aware . Pt. Advised to monitor at home. And if it remains high reach out to PCP, Hgb 9.5  Patient denies shortness of breath, fever, cough and chest pain at PAT appointment   All instructions explained to the patient, with a verbal understanding of the material. Patient agrees to go over the instructions while at home for a better understanding. Patient also instructed to self quarantine after being tested for COVID-19. The opportunity to ask questions was provided.

## 2024-06-07 NOTE — Patient Instructions (Addendum)
 SURGICAL WAITING ROOM VISITATION  Patients having surgery or a procedure may have no more than 2 support people in the waiting area - these visitors may rotate.    Children under the age of 66 must have an adult with them who is not the patient.  Visitors with respiratory illnesses are discouraged from visiting and should remain at home.  If the patient needs to stay at the hospital during part of their recovery, the visitor guidelines for inpatient rooms apply. Pre-op nurse will coordinate an appropriate time for 1 support person to accompany patient in pre-op.  This support person may not rotate.    Please refer to the [employer] website for the visitor guidelines for Inpatients (after your surgery is over and you are in a regular room).       Your procedure is scheduled on: 06-18-24   Report to [facility] Main Entrance    Report to admitting at    12:15 PM   Call this number if you have problems the morning of surgery [phone]   Do not eat food :After Midnight.   After Midnight you may have the following liquids until _11:30 _____ AM/  DAY OF SURGERY  then nothing by mouth  Water Non-Citrus Juices (without pulp, NO RED-Apple, White grape, White cranberry) Black Coffee (NO MILK/CREAM OR CREAMERS, sugar ok)  Clear Tea (NO MILK/CREAM OR CREAMERS, sugar ok) regular and decaf                             Plain Jell-O (NO RED)                                           Fruit ices (not with fruit pulp, NO RED)                                     Popsicles (NO RED)                                                               Sports drinks like Gatorade (NO RED)                           If you have questions, please contact your surgeon's office.   FOLLOW  ANY ADDITIONAL PRE OP INSTRUCTIONS YOU RECEIVED FROM YOUR SURGEON'S OFFICE!!!     Oral Hygiene is also important to reduce your risk of infection.                                    Remember - BRUSH YOUR TEETH  THE MORNING OF SURGERY WITH YOUR REGULAR TOOTHPASTE  DENTURES WILL BE REMOVED PRIOR TO SURGERY PLEASE DO NOT APPLY Poly grip OR ADHESIVES!!!   Do NOT smoke after Midnight   Stop all vitamins and herbal supplements 7 days before surgery.   Take these medicines the morning of surgery with A SIP OF WATER: inhaler and bring rescue  inhaler with you, ,xanax  if needed  OZEMPIC none before surgery has to be held at least one week before surgery  DO NOT TAKE ANY ORAL DIABETIC MEDICATIONS DAY OF YOUR SURGERY                                You may not have any metal on your body including hair pins, jewelry, and body piercing             Do not wear make-up, lotions, powders, perfumes/cologne, or deodorant  Do not wear nail polish including gel and S&S, artificial/acrylic nails, or any other type of covering on natural nails including finger and toenails. If you have artificial nails, gel coating, etc. that needs to be removed by a nail salon please have this removed prior to surgery or surgery may need to be canceled/ delayed if the surgeon/ anesthesia feels like they are unable to be safely monitored.   Do not shave  48 hours prior to surgery.               Do not bring valuables to the hospital. [facility] IS NOT             RESPONSIBLE   FOR VALUABLES.   Contacts, glasses, dentures or bridgework may not be worn into surgery.   Bring small overnight bag day of surgery.   DO NOT BRING YOUR HOME MEDICATIONS TO THE HOSPITAL. PHARMACY WILL DISPENSE MEDICATIONS LISTED ON YOUR MEDICATION LIST TO YOU DURING YOUR ADMISSION IN THE HOSPITAL!    Patients discharged on the day of surgery will not be allowed to drive home.  Someone NEEDS to stay with you for the first 24 hours after anesthesia.   Special Instructions: Bring a copy of your healthcare power of attorney and living will documents the day of surgery if you haven't scanned them before.              Please read over the following fact  sheets you were given: IF YOU HAVE QUESTIONS ABOUT YOUR PRE-OP INSTRUCTIONS PLEASE CALL [phone].   I If you test positive for Covid or have been in contact with anyone that has tested positive in the last 10 days please notify you surgeon.    [facility] - Preparing for Surgery Before surgery, you can play an important role.  Because skin is not sterile, your skin needs to be as free of germs as possible.  You can reduce the number of germs on your skin by washing with CHG (chlorahexidine gluconate) soap before surgery.  CHG is an antiseptic cleaner which kills germs and bonds with the skin to continue killing germs even after washing. Please DO NOT use if you have an allergy to CHG or antibacterial soaps.  If your skin becomes reddened/irritated stop using the CHG and inform your nurse when you arrive at Short Stay. Do not shave (including legs and underarms) for at least 48 hours prior to the first CHG shower.  You may shave your face/neck. Please follow these instructions carefully:  1.  Shower with CHG Soap the night before surgery and the  morning of Surgery.  2.  If you choose to wash your hair, wash your hair first as usual with your  normal  shampoo.  3.  After you shampoo, rinse your hair and body thoroughly to remove the  shampoo.  4.  Use CHG as you would any other liquid soap.  You can apply chg directly  to the skin and wash                       Gently with a scrungie or clean washcloth.  5.  Apply the CHG Soap to your body ONLY FROM THE NECK DOWN.   Do not use on face/ open                           Wound or open sores. Avoid contact with eyes, ears mouth and genitals (private parts).                       Wash face,  Genitals (private parts) with your normal soap.             6.  Wash thoroughly, paying special attention to the area where your surgery  will be performed.  7.  Thoroughly rinse your body with warm water from the neck down.  8.  DO NOT  shower/wash with your normal soap after using and rinsing off  the CHG Soap.                9.  Pat yourself dry with a clean towel.            10.  Wear clean pajamas.            11.  Place clean sheets on your bed the night of your first shower and do not  sleep with pets. Day of Surgery : Do not apply any lotions/deodorants the morning of surgery.  Please wear clean clothes to the hospital/surgery center.  FAILURE TO FOLLOW THESE INSTRUCTIONS MAY RESULT IN THE CANCELLATION OF YOUR SURGERY PATIENT SIGNATURE_________________________________  NURSE SIGNATURE__________________________________  ________________________________________________________________________

## 2024-06-11 ENCOUNTER — Other Ambulatory Visit: Payer: Self-pay

## 2024-06-11 ENCOUNTER — Encounter (HOSPITAL_COMMUNITY)
Admission: RE | Admit: 2024-06-11 | Discharge: 2024-06-11 | Disposition: A | Discharge status: Home / Self Care | Source: Ambulatory Visit | Attending: General Surgery | Admitting: General Surgery

## 2024-06-11 ENCOUNTER — Encounter (HOSPITAL_COMMUNITY): Payer: Self-pay

## 2024-06-11 VITALS — BP 201/119 | HR 83 | Temp 98.8°F | Resp 16 | Ht 62.0 in | Wt 295.0 lb

## 2024-06-11 DIAGNOSIS — Z01812 Encounter for preprocedural laboratory examination: Secondary | ICD-10-CM | POA: Diagnosis present

## 2024-06-11 DIAGNOSIS — E119 Type 2 diabetes mellitus without complications: Secondary | ICD-10-CM | POA: Insufficient documentation

## 2024-06-11 LAB — CBC
HCT: 35.5 % — ABNORMAL LOW (ref 36.0–46.0)
Hemoglobin: 9.5 g/dL — ABNORMAL LOW (ref 12.0–15.0)
MCH: 20 pg — ABNORMAL LOW (ref 26.0–34.0)
MCHC: 26.8 g/dL — ABNORMAL LOW (ref 30.0–36.0)
MCV: 74.9 fL — ABNORMAL LOW (ref 80.0–100.0)
Platelets: 575 K/uL — ABNORMAL HIGH (ref 150–400)
RBC: 4.74 MIL/uL (ref 3.87–5.11)
RDW: 18.1 % — ABNORMAL HIGH (ref 11.5–15.5)
WBC: 11 K/uL — ABNORMAL HIGH (ref 4.0–10.5)
nRBC: 0 % (ref 0.0–0.2)

## 2024-06-11 LAB — BASIC METABOLIC PANEL WITH GFR
Anion gap: 9 (ref 5–15)
BUN: 7 mg/dL (ref 6–20)
CO2: 27 mmol/L (ref 22–32)
Calcium: 9.4 mg/dL (ref 8.9–10.3)
Chloride: 103 mmol/L (ref 98–111)
Creatinine, Ser: 0.79 mg/dL (ref 0.44–1.00)
GFR, Estimated: >60 mL/min (ref >60)
Glucose, Bld: 150 mg/dL — ABNORMAL HIGH (ref 70–99)
Potassium: 4.5 mmol/L (ref 3.5–5.1)
Sodium: 139 mmol/L (ref 135–145)

## 2024-06-11 LAB — GLUCOSE, CAPILLARY: Glucose-Capillary: 173 mg/dL — ABNORMAL HIGH (ref 70–99)

## 2024-06-11 LAB — HEMOGLOBIN A1C
Hgb A1c MFr Bld: 7 % — ABNORMAL HIGH (ref 4.8–5.6)
Mean Plasma Glucose: 154.2 mg/dL

## 2024-06-18 ENCOUNTER — Ambulatory Visit (HOSPITAL_COMMUNITY)

## 2024-06-18 ENCOUNTER — Ambulatory Visit (HOSPITAL_COMMUNITY)
Admission: RE | Admit: 2024-06-18 | Discharge: 2024-06-18 | Disposition: A | Discharge status: Home / Self Care | Attending: General Surgery | Admitting: General Surgery

## 2024-06-18 ENCOUNTER — Encounter (HOSPITAL_COMMUNITY): Payer: Self-pay | Admitting: General Surgery

## 2024-06-18 ENCOUNTER — Ambulatory Visit (HOSPITAL_COMMUNITY): Payer: Self-pay | Admitting: Physician Assistant

## 2024-06-18 ENCOUNTER — Other Ambulatory Visit (HOSPITAL_COMMUNITY): Payer: Self-pay

## 2024-06-18 ENCOUNTER — Encounter (HOSPITAL_COMMUNITY)
Admission: RE | Disposition: A | Discharge status: Home / Self Care | Payer: Self-pay | Source: Home / Self Care | Attending: General Surgery

## 2024-06-18 DIAGNOSIS — Z3046 Encounter for surveillance of implantable subdermal contraceptive: Secondary | ICD-10-CM | POA: Diagnosis present

## 2024-06-18 DIAGNOSIS — J449 Chronic obstructive pulmonary disease, unspecified: Secondary | ICD-10-CM | POA: Diagnosis not present

## 2024-06-18 DIAGNOSIS — F17200 Nicotine dependence, unspecified, uncomplicated: Secondary | ICD-10-CM | POA: Diagnosis not present

## 2024-06-18 DIAGNOSIS — S40852A Superficial foreign body of left upper arm, initial encounter: Secondary | ICD-10-CM | POA: Diagnosis not present

## 2024-06-18 DIAGNOSIS — I10 Essential (primary) hypertension: Secondary | ICD-10-CM

## 2024-06-18 DIAGNOSIS — J45909 Unspecified asthma, uncomplicated: Secondary | ICD-10-CM | POA: Diagnosis not present

## 2024-06-18 DIAGNOSIS — E119 Type 2 diabetes mellitus without complications: Secondary | ICD-10-CM | POA: Diagnosis not present

## 2024-06-18 DIAGNOSIS — N926 Irregular menstruation, unspecified: Secondary | ICD-10-CM | POA: Diagnosis not present

## 2024-06-18 DIAGNOSIS — Z79899 Other long term (current) drug therapy: Secondary | ICD-10-CM | POA: Insufficient documentation

## 2024-06-18 DIAGNOSIS — Z87891 Personal history of nicotine dependence: Secondary | ICD-10-CM

## 2024-06-18 DIAGNOSIS — G43909 Migraine, unspecified, not intractable, without status migrainosus: Secondary | ICD-10-CM | POA: Diagnosis not present

## 2024-06-18 DIAGNOSIS — F419 Anxiety disorder, unspecified: Secondary | ICD-10-CM | POA: Diagnosis not present

## 2024-06-18 HISTORY — PX: REMOVAL OF DRUG DELIVERY IMPLANT: SHX6585

## 2024-06-18 LAB — GLUCOSE, CAPILLARY
Glucose-Capillary: 129 mg/dL — ABNORMAL HIGH (ref 70–99)
Glucose-Capillary: 100 mg/dL — ABNORMAL HIGH (ref 70–99)

## 2024-06-18 LAB — POCT PREGNANCY, URINE: Preg Test, Ur: NEGATIVE

## 2024-06-18 SURGERY — REMOVAL, DRUG DELIVERY IMPLANT
Anesthesia: General | Site: Arm Upper | Laterality: Left

## 2024-06-18 MED ORDER — PROPOFOL 1000 MG/100ML IV EMUL
INTRAVENOUS | Status: AC
  Filled 2024-06-18: qty 100

## 2024-06-18 MED ORDER — BUPIVACAINE-EPINEPHRINE 0.25% -1:200000 IJ SOLN
INTRAMUSCULAR | Status: DC | PRN
  Administered 2024-06-18: 10 mL

## 2024-06-18 MED ORDER — CHLORHEXIDINE GLUCONATE CLOTH 2 % EX PADS: 6.0000 | MEDICATED_PAD | Freq: Once | CUTANEOUS | Status: DC

## 2024-06-18 MED ORDER — ESMOLOL HCL 100 MG/10ML IV SOLN
INTRAVENOUS | Status: AC
  Filled 2024-06-18: qty 10

## 2024-06-18 MED ORDER — EPHEDRINE SULFATE-NACL 50-0.9 MG/10ML-% IV SOSY
PREFILLED_SYRINGE | INTRAVENOUS | Status: DC | PRN
  Administered 2024-06-18 (×2): 5 mg via INTRAVENOUS

## 2024-06-18 MED ORDER — ALBUTEROL SULFATE HFA 108 (90 BASE) MCG/ACT IN AERS
INHALATION_SPRAY | RESPIRATORY_TRACT | Status: DC | PRN
  Administered 2024-06-18: 6 via RESPIRATORY_TRACT

## 2024-06-18 MED ORDER — GLYCOPYRROLATE 0.2 MG/ML IJ SOLN
INTRAMUSCULAR | Status: AC
  Filled 2024-06-18: qty 1

## 2024-06-18 MED ORDER — LIDOCAINE HCL (PF) 2 % IJ SOLN
INTRAMUSCULAR | Status: AC
  Filled 2024-06-18: qty 5

## 2024-06-18 MED ORDER — DEXAMETHASONE SODIUM PHOSPHATE 10 MG/ML IJ SOLN
INTRAMUSCULAR | Status: AC
  Filled 2024-06-18: qty 1

## 2024-06-18 MED ORDER — PROPOFOL 10 MG/ML IV BOLUS
INTRAVENOUS | Status: DC | PRN
Start: 2024-06-18 — End: 2024-06-18
  Administered 2024-06-18: 20 mg via INTRAVENOUS
  Administered 2024-06-18: 180 mg via INTRAVENOUS
  Administered 2024-06-18: 150 ug/kg/min via INTRAVENOUS

## 2024-06-18 MED ORDER — OXYCODONE HCL 5 MG/5ML PO SOLN: 5.0000 mg | Freq: Once | ORAL | Status: DC | PRN

## 2024-06-18 MED ORDER — SUCCINYLCHOLINE CHLORIDE 200 MG/10ML IV SOSY
PREFILLED_SYRINGE | INTRAVENOUS | Status: DC | PRN
  Administered 2024-06-18: 140 mg via INTRAVENOUS

## 2024-06-18 MED ORDER — OXYCODONE HCL 5 MG PO TABS: 5.0000 mg | ORAL_TABLET | Freq: Once | ORAL | Status: DC | PRN

## 2024-06-18 MED ORDER — ORAL CARE MOUTH RINSE: 15.0000 mL | Freq: Once | OROMUCOSAL | Status: AC

## 2024-06-18 MED ORDER — ONDANSETRON HCL 4 MG/2ML IJ SOLN
INTRAMUSCULAR | Status: AC
Start: 2024-06-18 — End: 2024-06-18
  Filled 2024-06-18: qty 2

## 2024-06-18 MED ORDER — PHENYLEPHRINE 80 MCG/ML (10ML) SYRINGE FOR IV PUSH (FOR BLOOD PRESSURE SUPPORT)
PREFILLED_SYRINGE | INTRAVENOUS | Status: DC | PRN
  Administered 2024-06-18: 80 ug via INTRAVENOUS
  Administered 2024-06-18: 160 ug via INTRAVENOUS

## 2024-06-18 MED ORDER — DEXAMETHASONE SODIUM PHOSPHATE 10 MG/ML IJ SOLN
INTRAMUSCULAR | Status: DC | PRN
  Administered 2024-06-18: 8 mg via INTRAVENOUS

## 2024-06-18 MED ORDER — 0.9 % SODIUM CHLORIDE (POUR BTL) OPTIME
TOPICAL | Status: DC | PRN
  Administered 2024-06-18: 1000 mL

## 2024-06-18 MED ORDER — GLYCOPYRROLATE 0.2 MG/ML IJ SOLN
INTRAMUSCULAR | Status: DC | PRN
Start: 2024-06-18 — End: 2024-06-18
  Administered 2024-06-18: .2 mg via INTRAVENOUS

## 2024-06-18 MED ORDER — BUPIVACAINE-EPINEPHRINE (PF) 0.25% -1:200000 IJ SOLN
INTRAMUSCULAR | Status: AC
Start: 2024-06-18 — End: 2024-06-18
  Filled 2024-06-18: qty 30

## 2024-06-18 MED ORDER — CHLORHEXIDINE GLUCONATE 0.12 % MT SOLN
15.0000 mL | Freq: Once | OROMUCOSAL | Status: AC
  Administered 2024-06-18: 15 mL via OROMUCOSAL

## 2024-06-18 MED ORDER — MIDAZOLAM HCL 2 MG/2ML IJ SOLN
INTRAMUSCULAR | Status: AC
  Filled 2024-06-18: qty 2

## 2024-06-18 MED ORDER — FENTANYL CITRATE PF 50 MCG/ML IJ SOSY: 25.0000 ug | PREFILLED_SYRINGE | INTRAMUSCULAR | Status: DC | PRN

## 2024-06-18 MED ORDER — DROPERIDOL 2.5 MG/ML IJ SOLN: 0.6250 mg | Freq: Once | INTRAMUSCULAR | Status: DC | PRN

## 2024-06-18 MED ORDER — LACTATED RINGERS IV SOLN: INTRAVENOUS | Status: DC

## 2024-06-18 MED ORDER — CEFAZOLIN SODIUM-DEXTROSE 3-4 GM/150ML-% IV SOLN: 3.0000 g | Freq: Once | INTRAVENOUS | Status: DC

## 2024-06-18 MED ORDER — ROCURONIUM BROMIDE 10 MG/ML (PF) SYRINGE
PREFILLED_SYRINGE | INTRAVENOUS | Status: DC | PRN
  Administered 2024-06-18: 50 mg via INTRAVENOUS

## 2024-06-18 MED ORDER — DEXMEDETOMIDINE HCL IN NACL 80 MCG/20ML IV SOLN
INTRAVENOUS | Status: AC
  Filled 2024-06-18: qty 20

## 2024-06-18 MED ORDER — SUGAMMADEX SODIUM 200 MG/2ML IV SOLN
INTRAVENOUS | Status: DC | PRN
Start: 2024-06-18 — End: 2024-06-18
  Administered 2024-06-18: 200 mg via INTRAVENOUS

## 2024-06-18 MED ORDER — LIDOCAINE 2% (20 MG/ML) 5 ML SYRINGE
INTRAMUSCULAR | Status: DC | PRN
  Administered 2024-06-18: 100 mg via INTRAVENOUS

## 2024-06-18 MED ORDER — PROPOFOL 10 MG/ML IV BOLUS
INTRAVENOUS | Status: AC
  Filled 2024-06-18: qty 20

## 2024-06-18 MED ORDER — ALBUTEROL SULFATE HFA 108 (90 BASE) MCG/ACT IN AERS
INHALATION_SPRAY | RESPIRATORY_TRACT | Status: AC
  Filled 2024-06-18: qty 6.7

## 2024-06-18 MED ORDER — MIDAZOLAM HCL 5 MG/5ML IJ SOLN
INTRAMUSCULAR | Status: DC | PRN
  Administered 2024-06-18: 2 mg via INTRAVENOUS

## 2024-06-18 MED ORDER — FENTANYL CITRATE (PF) 100 MCG/2ML IJ SOLN
INTRAMUSCULAR | Status: AC
  Filled 2024-06-18: qty 2

## 2024-06-18 MED ORDER — ACETAMINOPHEN 10 MG/ML IV SOLN: 1000.0000 mg | Freq: Once | INTRAVENOUS | Status: DC | PRN

## 2024-06-18 MED ORDER — DEXMEDETOMIDINE HCL IN NACL 80 MCG/20ML IV SOLN
INTRAVENOUS | Status: DC | PRN
  Administered 2024-06-18: 8 ug via INTRAVENOUS

## 2024-06-18 MED ORDER — FENTANYL CITRATE (PF) 100 MCG/2ML IJ SOLN
INTRAMUSCULAR | Status: DC | PRN
  Administered 2024-06-18: 75 ug via INTRAVENOUS
  Administered 2024-06-18: 25 ug via INTRAVENOUS

## 2024-06-18 MED ORDER — SUGAMMADEX SODIUM 200 MG/2ML IV SOLN
INTRAVENOUS | Status: AC
Start: 2024-06-18 — End: 2024-06-18
  Filled 2024-06-18: qty 2

## 2024-06-18 MED ORDER — EPHEDRINE 5 MG/ML INJ
INTRAVENOUS | Status: AC
  Filled 2024-06-18: qty 5

## 2024-06-18 MED ORDER — CEFAZOLIN SODIUM-DEXTROSE 3-4 GM/150ML-% IV SOLN
3.0000 g | INTRAVENOUS | Status: AC
  Administered 2024-06-18: 3 g via INTRAVENOUS
  Filled 2024-06-18: qty 150

## 2024-06-18 MED ORDER — IBUPROFEN 800 MG PO TABS
800.0000 mg | ORAL_TABLET | Freq: Three times a day (TID) | ORAL | 0 refills | Status: DC | PRN
  Filled 2024-06-18: qty 30, 10d supply, fill #0

## 2024-06-18 SURGICAL SUPPLY — 24 items
BAG COUNTER SPONGE SURGICOUNT (BAG) IMPLANT
BENZOIN TINCTURE PRP APPL 2/3 (GAUZE/BANDAGES/DRESSINGS) IMPLANT
BLADE CLIPPER SURG (BLADE) IMPLANT
CHLORAPREP W/TINT 26 (MISCELLANEOUS) IMPLANT
COVER SURGICAL LIGHT HANDLE (MISCELLANEOUS) ×1 IMPLANT
DERMABOND ADVANCED .7 DNX12 (GAUZE/BANDAGES/DRESSINGS) IMPLANT
DRAPE LAPAROTOMY T 98X78 PEDS (DRAPES) ×1 IMPLANT
DRSG TEGADERM 4X4.75 (GAUZE/BANDAGES/DRESSINGS) IMPLANT
ELECT REM PT RETURN 15FT ADLT (MISCELLANEOUS) ×1 IMPLANT
GAUZE SPONGE 4X4 12PLY STRL (GAUZE/BANDAGES/DRESSINGS) IMPLANT
GLOVE BIOGEL PI IND STRL 7.5 (GLOVE) ×1 IMPLANT
GLOVE SURG SS PI 7.0 STRL IVOR (GLOVE) ×1 IMPLANT
GOWN STRL REUS W/ TWL LRG LVL3 (GOWN DISPOSABLE) ×1 IMPLANT
KIT BASIN OR (CUSTOM PROCEDURE TRAY) ×1 IMPLANT
KIT TURNOVER KIT A (KITS) ×1 IMPLANT
NDL HYPO 25X1 1.5 SAFETY (NEEDLE) ×1 IMPLANT
NEEDLE HYPO 25X1 1.5 SAFETY (NEEDLE) ×1 IMPLANT
PACK GENERAL/GYN (CUSTOM PROCEDURE TRAY) ×1 IMPLANT
STRIP CLOSURE SKIN 1/2X4 (GAUZE/BANDAGES/DRESSINGS) IMPLANT
SUT MNCRL AB 4-0 PS2 18 (SUTURE) IMPLANT
SUT VIC AB 2-0 SH 27X BRD (SUTURE) IMPLANT
SUT VICRYL 3-0 CR8 SH (SUTURE) ×1 IMPLANT
SYR CONTROL 10ML LL (SYRINGE) ×1 IMPLANT
TOWEL OR 17X26 10 PK STRL BLUE (TOWEL DISPOSABLE) ×1 IMPLANT

## 2024-06-18 NOTE — Anesthesia Postprocedure Evaluation (Signed)
 Anesthesia Post Note  Patient: Katherine Douglas  Procedure(s) Performed: REMOVAL, DRUG DELIVERY IMPLANT (Left: Arm Upper)     Patient location during evaluation: PACU Anesthesia Type: General Level of consciousness: awake and alert Pain management: pain level controlled Vital Signs Assessment: post-procedure vital signs reviewed and stable Respiratory status: spontaneous breathing, nonlabored ventilation, respiratory function stable and patient connected to nasal cannula oxygen  Cardiovascular status: blood pressure returned to baseline and stable Postop Assessment: no apparent nausea or vomiting Anesthetic complications: no   No notable events documented.  Last Vitals:  Vitals:   06/18/24 1545 06/18/24 1600  BP: 123/65 109/65  Pulse: 87 77  Resp: 19 15  Temp:    SpO2: 94% 99%    Last Pain:  Vitals:   06/18/24 1600  TempSrc:   PainSc: 0-No pain                 Franky JONETTA Bald

## 2024-06-18 NOTE — Anesthesia Preprocedure Evaluation (Addendum)
 Anesthesia Evaluation  Patient identified by MRN, date of birth, ID band Patient awake    Reviewed: Allergy & Precautions, NPO status , Patient's Chart, lab work & pertinent test results  Airway Mallampati: II  TM Distance: >3 FB Neck ROM: Full    Dental  (+) Teeth Intact, Dental Advisory Given   Pulmonary asthma , COPD, Current Smoker   breath sounds clear to auscultation       Cardiovascular hypertension, Pt. on medications  Rhythm:Regular Rate:Normal     Neuro/Psych  Headaches  Anxiety        GI/Hepatic negative GI ROS, Neg liver ROS,,,  Endo/Other  diabetes    Renal/GU negative Renal ROS     Musculoskeletal negative musculoskeletal ROS (+)    Abdominal   Peds  Hematology negative hematology ROS (+)   Anesthesia Other Findings   Reproductive/Obstetrics                              Anesthesia Physical Anesthesia Plan  ASA: 3  Anesthesia Plan: General   Post-op Pain Management: Tylenol  PO (pre-op)* and Toradol  IV (intra-op)*   Induction: Intravenous  PONV Risk Score and Plan: 3 and Ondansetron , Dexamethasone  and Midazolam   Airway Management Planned: LMA  Additional Equipment: None  Intra-op Plan:   Post-operative Plan: Extubation in OR  Informed Consent: I have reviewed the patients History and Physical, chart, labs and discussed the procedure including the risks, benefits and alternatives for the proposed anesthesia with the patient or authorized representative who has indicated his/her understanding and acceptance.     Dental advisory given  Plan Discussed with: CRNA  Anesthesia Plan Comments:          Anesthesia Quick Evaluation

## 2024-06-18 NOTE — H&P (Signed)
 Chief Complaint:   Chief Complaint  Patient presents with  New Consultation   Subjective   Katherine Douglas is a 43 y.o. female new patient in today for: History of Present Illness Katherine Douglas is a 43 year old female who presents for removal of a long-standing Exponon implant.  The Exponon implant was placed approximately five years ago, though it may have been longer. Initially palpable, it is no longer detectable. An office attempt to locate and remove it was unsuccessful. She experiences irregular menstrual cycles, with periods lasting four days, stopping for three days, and resuming for seven days, resulting in two cycles per month. This month, she has not menstruated. She associates worsening migraines with the implant. Her diabetes is well-controlled with Exempted. She has a history of smoking and is attempting to quit.  Social Drivers of Health with Concerns   Tobacco Use: High Risk (05/16/2024)  Patient History  Smoking Tobacco Use: Every Day  Smokeless Tobacco Use: Never  Food Insecurity: Food Insecurity Present (10/01/2022)  Received from [facility]  Hunger Vital Sign  Within the past 12 months, you worried that your food would run out before you got the money to buy more.: Sometimes true  Within the past 12 months, the food you bought just didn't last and you didn't have money to get more.: Sometimes true    No data to display      Outpatient Medications Prior to Visit  Medication Sig Dispense Refill  busPIRone (BUSPAR) 15 MG tablet   No facility-administered medications prior to visit.    Objective   Vitals:  05/16/24 1404 05/16/24 1405  BP: (!) 148/84  Pulse: (!) 120  Temp: 37.1 C (98.8 F)  SpO2: 98%  Weight: (!) 135.3 kg (298 lb 3.2 oz)  Height: 157.5 cm (5' 2)  PainSc: 0-No pain   Body mass index is 54.54 kg/m. Physical Exam Constitutional:  Appearance: Normal appearance.  HENT:  Head: Normocephalic and atraumatic.  Pulmonary:   Effort: Pulmonary effort is normal.  Musculoskeletal:  General: Normal range of motion.  Cervical back: Normal range of motion.  Skin: Comments: Unable to palpate left upper arm foreign body  Neurological:  General: No focal deficit present.  Mental Status: She is alert and oriented to person, place, and time. Mental status is at baseline.  Psychiatric:  Mood and Affect: Mood normal.  Behavior: Behavior normal.  Thought Content: Thought content normal.    I reviewed notes by Lynwood Solomons  Assessment/Plan:   Assessment & Plan Retained contraceptive implant Implant retained for over five years, possibly causing irregular cycles and migraines. Implant not palpable, may have migrated or be deeper than expected, near critical structures. Smoking and diabetes increase infection risk post-surgery. - Schedule surgical removal in operating room with anesthesia. - Use x-ray and ultrasound during surgery to identify implant. - Make small incision for removal, procedure under 30 minutes. - Discharge same day, resume normal activities in a week. - Schedule follow-up post-surgery for healing assessment. - Discuss need for post-surgery transportation. - Advise consultation with gynecologist for birth control and menstrual management post-removal. - Encourage smoking cessation to reduce infection risk.

## 2024-06-18 NOTE — Anesthesia Procedure Notes (Deleted)
 Procedure Name: LMA Insertion Date/Time: 06/18/2024 2:18 PM  Performed by: Nada Corean CROME, CRNAPre-anesthesia Checklist: Emergency Drugs available, Patient identified, Suction available, Patient being monitored and Timeout performed Patient Re-evaluated:Patient Re-evaluated prior to induction Oxygen  Delivery Method: Circle system utilized Preoxygenation: Pre-oxygenation with 100% oxygen  Induction Type: IV induction Ventilation: Two handed mask ventilation required LMA: LMA inserted LMA Size: 5.0 Tube type: Oral Number of attempts: 3 Dental Injury: Teeth and Oropharynx as per pre-operative assessment  Comments: LMA not seating effectively for adequate Vt; converted to GETA

## 2024-06-18 NOTE — Transfer of Care (Signed)
 Immediate Anesthesia Transfer of Care Note  Patient: Katherine Douglas  Procedure(s) Performed: REMOVAL, DRUG DELIVERY IMPLANT (Left: Arm Upper)  Patient Location: PACU  Anesthesia Type:General  Level of Consciousness: sedated, patient cooperative, and responds to stimulation  Airway & Oxygen  Therapy: Patient Spontanous Breathing and Patient connected to face mask oxygen   Post-op Assessment: Report given to RN and Post -op Vital signs reviewed and stable  Post vital signs: Reviewed and stable  Last Vitals:  Vitals Value Taken Time  BP 120/63 06/18/24 15:06  Temp    Pulse 87 06/18/24 15:08  Resp 17 06/18/24 15:08  SpO2 97 % 06/18/24 15:08  Vitals shown include unfiled device data.  Last Pain:  Vitals:   06/18/24 1252  TempSrc: Oral  PainSc: 0-No pain         Complications: No notable events documented.

## 2024-06-18 NOTE — Anesthesia Procedure Notes (Addendum)
 Procedure Name: Intubation Date/Time: 06/18/2024 2:25 PM  Performed by: Corinne Garnette BRAVO, MDPre-anesthesia Checklist: Patient identified, Emergency Drugs available, Suction available and Patient being monitored Patient Re-evaluated:Patient Re-evaluated prior to induction Oxygen  Delivery Method: Circle system utilized Preoxygenation: Pre-oxygenation with 100% oxygen  Induction Type: IV induction and Rapid sequence Ventilation: Two handed mask ventilation required and Oral airway inserted - appropriate to patient size Laryngoscope Size: Mac and 4 Grade View: Grade II Tube size: 7.5 mm Number of attempts: 1 Airway Equipment and Method: Stylet Placement Confirmation: ETT inserted through vocal cords under direct vision Secured at: 23 cm Tube secured with: Tape Dental Injury: Teeth and Oropharynx as per pre-operative assessment  Difficulty Due To: Difficulty was anticipated Future Recommendations: Recommend- induction with short-acting agent, and alternative techniques readily available

## 2024-06-18 NOTE — Op Note (Signed)
 Preoperative diagnosis: foreign body in left upper arm  Postoperative diagnosis: same   Procedure: removal of nexplanon device with ultrasound guidance  Surgeon: Herlene Bureau, M.D.  Asst: none  Anesthesia: general ETT  Indications for procedure: Katherine Douglas is a 43 y.o. year old female with history of nexplanon device that has been unable to be removed. She presents for surgical removal.  Description of procedure: The patient was brought into the operative suite. Anesthesia was administered with General endotracheal anesthesia. WHO checklist was applied. The patient was then placed in supine position. The area was prepped and draped in the usual sterile fashion.  Next, US  was used to identify the foreign body. It was located in the posterio-medial upper arm. Marcaine  was injected over the foreign body. A small incision was made. Blunt dissection was used to identify the foreign body. Palpation and additional blunt dissection was used to identify the foreign body. The foreign body was isolated away from the surrounding tissue and removed. The subcutaneous tissue was closed with 3-0 vicryl. Dermabond was put in place for dressing.  Findings: intact nexplanon device  Specimen: none  Implant: none   Blood loss: 5 ml  Local anesthesia: 10 ml marcaine    Complications: none  Herlene Bureau, M.D. General, Bariatric, & Minimally Invasive Surgery [facility]

## 2024-06-19 ENCOUNTER — Encounter (HOSPITAL_COMMUNITY): Payer: Self-pay | Admitting: General Surgery

## 2024-06-20 ENCOUNTER — Other Ambulatory Visit (HOSPITAL_COMMUNITY): Payer: Self-pay

## 2024-08-17 ENCOUNTER — Ambulatory Visit (INDEPENDENT_AMBULATORY_CARE_PROVIDER_SITE_OTHER)

## 2024-08-17 ENCOUNTER — Ambulatory Visit: Admission: EM | Admit: 2024-08-17 | Discharge: 2024-08-17 | Disposition: A | Discharge status: Home / Self Care

## 2024-08-17 DIAGNOSIS — M542 Cervicalgia: Secondary | ICD-10-CM | POA: Diagnosis not present

## 2024-08-17 DIAGNOSIS — S46812A Strain of other muscles, fascia and tendons at shoulder and upper arm level, left arm, initial encounter: Secondary | ICD-10-CM

## 2024-08-17 DIAGNOSIS — S46811A Strain of other muscles, fascia and tendons at shoulder and upper arm level, right arm, initial encounter: Secondary | ICD-10-CM

## 2024-08-17 MED ORDER — KETOROLAC TROMETHAMINE 30 MG/ML IJ SOLN
30.0000 mg | Freq: Once | INTRAMUSCULAR | Status: AC
  Administered 2024-08-17: 30 mg via INTRAMUSCULAR

## 2024-08-17 MED ORDER — CYCLOBENZAPRINE HCL 5 MG PO TABS: 5.0000 mg | ORAL_TABLET | Freq: Three times a day (TID) | ORAL | 0 refills | Status: AC | PRN

## 2024-08-17 MED ORDER — IBUPROFEN 800 MG PO TABS: 800.0000 mg | ORAL_TABLET | Freq: Three times a day (TID) | ORAL | 0 refills | Status: AC | PRN

## 2024-08-17 NOTE — Discharge Instructions (Addendum)
 X-ray of the neck done today.  Final evaluation by the radiologist does not show any acute process.  Symptoms and physical exam findings are most consistent with strain of the trapezius muscle bilateral.  This can cause soreness and stiffness in the neck but also a posterior headache.  We can treat this with strong anti-inflammatories as well as muscle relaxers.  We recommend the following: Toradol  injection given today. This is a medication to help with pain. This is not a narcotic.   Flexeril  5 mg every 8 hours as needed for muscle spasms.  Use caution as this medication can cause drowsiness.  Start tomorrow 11/23 ibuprofen  800 mg every 8 hours as needed for pain Light stretching to improve mobility. May alternate heat and ice to help with symptoms.   May use moist heat on the area to improve symptoms Return to urgent care or PCP if symptoms worsen or fail to resolve.
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
